# Patient Record
Sex: Female | Born: 1995 | Race: White | Hispanic: No | Marital: Married | Smoking: Former smoker
Health system: Southern US, Community
[De-identification: ages and names within clinical notes are randomized; demographics above are authoritative.]

## PROBLEM LIST (undated history)

## (undated) ENCOUNTER — Inpatient Hospital Stay (HOSPITAL_COMMUNITY): Payer: Self-pay

## (undated) DIAGNOSIS — R87629 Unspecified abnormal cytological findings in specimens from vagina: Secondary | ICD-10-CM

## (undated) DIAGNOSIS — E0590 Thyrotoxicosis, unspecified without thyrotoxic crisis or storm: Secondary | ICD-10-CM

## (undated) DIAGNOSIS — F419 Anxiety disorder, unspecified: Secondary | ICD-10-CM

## (undated) HISTORY — DX: Anxiety disorder, unspecified: F41.9

## (undated) HISTORY — PX: ABDOMINAL SURGERY: SHX537

## (undated) HISTORY — PX: OTHER SURGICAL HISTORY: SHX169

## (undated) HISTORY — PX: BALLOON SINUPLASTY: SHX5740

## (undated) HISTORY — DX: Unspecified abnormal cytological findings in specimens from vagina: R87.629

---
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
## 2013-01-09 ENCOUNTER — Encounter (HOSPITAL_COMMUNITY): Payer: Self-pay | Admitting: Emergency Medicine

## 2013-01-09 ENCOUNTER — Emergency Department (HOSPITAL_COMMUNITY): Payer: BC Managed Care – PPO

## 2013-01-09 ENCOUNTER — Emergency Department (HOSPITAL_COMMUNITY)
Admission: EM | Admit: 2013-01-09 | Discharge: 2013-01-09 | Disposition: A | Discharge status: Home / Self Care | Payer: BC Managed Care – PPO | Attending: Emergency Medicine | Admitting: Emergency Medicine

## 2013-01-09 DIAGNOSIS — R079 Chest pain, unspecified: Secondary | ICD-10-CM | POA: Insufficient documentation

## 2013-01-09 DIAGNOSIS — Z3202 Encounter for pregnancy test, result negative: Secondary | ICD-10-CM | POA: Insufficient documentation

## 2013-01-09 LAB — RAPID URINE DRUG SCREEN, HOSP PERFORMED
Amphetamines: NOT DETECTED
Barbiturates: NOT DETECTED
Benzodiazepines: NOT DETECTED
Cocaine: NOT DETECTED
Opiates: NOT DETECTED
Tetrahydrocannabinol: NOT DETECTED

## 2013-01-09 LAB — CBC WITH DIFFERENTIAL/PLATELET
Basophils Absolute: 0 10*3/uL (ref 0.0–0.1)
Basophils Relative: 0 % (ref 0–1)
Eosinophils Absolute: 0 10*3/uL (ref 0.0–0.7)
Eosinophils Relative: 0 % (ref 0–5)
HCT: 36.6 % (ref 36.0–46.0)
Hemoglobin: 11.7 g/dL — ABNORMAL LOW (ref 12.0–15.0)
Lymphocytes Relative: 6 % — ABNORMAL LOW (ref 12–46)
Lymphs Abs: 0.8 10*3/uL (ref 0.7–4.0)
MCH: 26.4 pg (ref 26.0–34.0)
MCHC: 32 g/dL (ref 30.0–36.0)
MCV: 82.4 fL (ref 78.0–100.0)
Monocytes Absolute: 0.5 10*3/uL (ref 0.1–1.0)
Monocytes Relative: 4 % (ref 3–12)
Neutro Abs: 12.2 10*3/uL — ABNORMAL HIGH (ref 1.7–7.7)
Neutrophils Relative %: 90 % — ABNORMAL HIGH (ref 43–77)
Platelets: 303 10*3/uL (ref 150–400)
RBC: 4.44 MIL/uL (ref 3.87–5.11)
RDW: 14.1 % (ref 11.5–15.5)
WBC: 13.5 10*3/uL — ABNORMAL HIGH (ref 4.0–10.5)

## 2013-01-09 LAB — BASIC METABOLIC PANEL
BUN: 5 mg/dL — ABNORMAL LOW (ref 6–23)
CO2: 24 mEq/L (ref 19–32)
Calcium: 9.6 mg/dL (ref 8.4–10.5)
Chloride: 107 mEq/L (ref 96–112)
Creatinine, Ser: 0.71 mg/dL (ref 0.50–1.10)
GFR calc Af Amer: >90 mL/min (ref >90)
GFR calc non Af Amer: >90 mL/min (ref >90)
Glucose, Bld: 113 mg/dL — ABNORMAL HIGH (ref 70–99)
Potassium: 4.2 mEq/L (ref 3.5–5.1)
Sodium: 141 mEq/L (ref 135–145)

## 2013-01-09 LAB — POCT PREGNANCY, URINE: Preg Test, Ur: NEGATIVE

## 2013-01-09 LAB — ETHANOL: Alcohol, Ethyl (B): <11 mg/dL (ref 0–11)

## 2013-01-09 LAB — CK: Total CK: 90 U/L (ref 7–177)

## 2013-01-09 MED ORDER — LORAZEPAM 2 MG/ML IJ SOLN
1.0000 mg | Freq: Once | INTRAMUSCULAR | Status: AC
  Administered 2013-01-09: 1 mg via INTRAVENOUS
  Filled 2013-01-09: qty 1

## 2013-01-09 MED ORDER — SODIUM CHLORIDE 0.9 % IV BOLUS (SEPSIS)
1000.0000 mL | Freq: Once | INTRAVENOUS | Status: AC
  Administered 2013-01-09: 1000 mL via INTRAVENOUS

## 2013-01-09 NOTE — ED Provider Notes (Signed)
CSN: [id number]     Arrival date & time 01/09/13  1958 History   First MD Initiated Contact with Patient 01/09/13 2109     Chief Complaint  Patient presents with  . Chest Pain  history is obtained from patient and patient's mother (Consider location/radiation/quality/duration/timing/severity/associated sxs/prior Treatment) HPI Plan the bilateral chest pain onset approximately 5 PM today after drinking a daiquiri andsmoking marijuana. Nothing makes symptoms better or worse.no treatment prior to coming here History reviewed. No pertinent past medical history. Past Surgical History  Procedure Laterality Date  . 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 No pertinent family history. History  Substance Use Topics  . Smoking status: Never Smoker   . Smokeless tobacco: Not on file  . Alcohol Use: No  admits to alcohol admits to marijuana admits to cigarettes OB History   Grav Para Term Preterm Abortions TAB SAB Ect Mult Living                 Review of Systems  Constitutional: Negative.   HENT: Negative.   Respiratory: Negative.   Cardiovascular: Positive for chest pain.  Gastrointestinal: Negative.   Musculoskeletal: Negative.   Skin: Negative.   Neurological: Negative.   Psychiatric/Behavioral: Negative.   All other systems reviewed and are negative.    Allergies  Review of patient's allergies indicates no known allergies.  Home Medications  No current outpatient prescriptions on file. BP 146/80  Pulse 140  Temp(Src) 98.9 F (37.2 C) (Oral)  Resp 20  Ht 5\' 3"  (1.6 m)  Wt 160 lb (72.576 kg)  BMI 28.35 kg/m2  SpO2 97%  LMP 01/08/2013 Physical Exam  Nursing note and vitals reviewed. Constitutional: She is oriented to person, place, and time. She appears well-developed and well-nourished.  HENT:  Head: Normocephalic and atraumatic.  Eyes: Conjunctivae are normal. Pupils are equal, round, and reactive to light.  Neck: Neck supple. No tracheal deviation present. No  thyromegaly present.  Cardiovascular: Regular rhythm.   No murmur heard. Mildly tachycardic  Pulmonary/Chest: Effort normal and breath sounds normal.  Abdominal: Soft. Bowel sounds are normal. She exhibits no distension. There is no tenderness.  Musculoskeletal: Normal range of motion. She exhibits no edema and no tenderness.  Neurological: She is alert and oriented to person, place, and time. Coordination normal.  Skin: Skin is warm and dry. No rash noted.  Psychiatric: She has a normal mood and affect.    ED Course  Procedures (including critical care time) Labs Review Labs Reviewed - No data to display Imaging Review No results found.  EKG Interpretation     Ventricular Rate:  88 PR Interval:  116 QRS Duration: 70 QT Interval:  342 QTC Calculation: 413 R Axis:   32 Text Interpretation:  Sinus rhythm with marked sinus arrhythmia Cannot rule out Anterior infarct , age undetermined Abnormal ECG When compared with ECG of 09-Jan-2013 20:46, No significant change was found Since last tracing rate slower           Results for orders placed during the hospital encounter of 01/09/13  BASIC METABOLIC PANEL      Result Value Range   Sodium 141  135 - 145 mEq/L   Potassium 4.2  3.5 - 5.1 mEq/L   Chloride 107  96 - 112 mEq/L   CO2 24  19 - 32 mEq/L   Glucose, Bld 113 (*) 70 - 99 mg/dL   BUN 5 (*) 6 - 23 mg/dL   Creatinine, Ser 4.09  0.50 - 1.10 mg/dL   Calcium 9.6  8.4 - 16.1 mg/dL   GFR calc non Af Amer >90  >90 mL/min   GFR calc Af Amer >90  >90 mL/min  CBC WITH DIFFERENTIAL      Result Value Range   WBC 13.5 (*) 4.0 - 10.5 K/uL   RBC 4.44  3.87 - 5.11 MIL/uL   Hemoglobin 11.7 (*) 12.0 - 15.0 g/dL   HCT 09.6  04.5 - 40.9 %   MCV 82.4  78.0 - 100.0 fL   MCH 26.4  26.0 - 34.0 pg   MCHC 32.0  30.0 - 36.0 g/dL   RDW 81.1  91.4 - 78.2 %   Platelets 303  150 - 400 K/uL   Neutrophils Relative % 90 (*) 43 - 77 %   Neutro Abs 12.2 (*) 1.7 - 7.7 K/uL   Lymphocytes  Relative 6 (*) 12 - 46 %   Lymphs Abs 0.8  0.7 - 4.0 K/uL   Monocytes Relative 4  3 - 12 %   Monocytes Absolute 0.5  0.1 - 1.0 K/uL   Eosinophils Relative 0  0 - 5 %   Eosinophils Absolute 0.0  0.0 - 0.7 K/uL   Basophils Relative 0  0 - 1 %   Basophils Absolute 0.0  0.0 - 0.1 K/uL  CK      Result Value Range   Total CK 90  7 - 177 U/L  ETHANOL      Result Value Range   Alcohol, Ethyl (B) <11  0 - 11 mg/dL  POCT PREGNANCY, URINE      Result Value Range   Preg Test, Ur NEGATIVE  NEGATIVE   No results found.   MDM  No diagnosis found. Pt signed out to Dr. Judd Lien at 1015 pm Diagnosis #1 chest pain #2 substance abuse    Doug Sou, MD 01/09/13 2243
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
## 2013-01-09 NOTE — ED Notes (Signed)
Chest pain, onset 5 pm.  Onset after having a virgin dacquiri. Began having chest pain.
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
## 2013-01-12 ENCOUNTER — Encounter (HOSPITAL_COMMUNITY): Payer: Self-pay | Admitting: Emergency Medicine

## 2013-01-12 DIAGNOSIS — R079 Chest pain, unspecified: Secondary | ICD-10-CM | POA: Insufficient documentation

## 2013-01-12 DIAGNOSIS — Z3202 Encounter for pregnancy test, result negative: Secondary | ICD-10-CM | POA: Insufficient documentation

## 2013-01-12 NOTE — ED Notes (Signed)
Pt walking outside to smoke while waiting

## 2013-01-12 NOTE — ED Notes (Signed)
Pt reporting pain in middle of chest.  States that she was seen on Thursday for same.  Pain not as bad this time as before. No nausea or SOB

## 2013-01-13 ENCOUNTER — Emergency Department (HOSPITAL_COMMUNITY)
Admission: EM | Admit: 2013-01-13 | Discharge: 2013-01-13 | Disposition: A | Discharge status: Home / Self Care | Payer: BC Managed Care – PPO | Attending: Emergency Medicine | Admitting: Emergency Medicine

## 2013-01-13 ENCOUNTER — Encounter (HOSPITAL_COMMUNITY): Payer: Self-pay | Admitting: Emergency Medicine

## 2013-01-13 ENCOUNTER — Emergency Department (HOSPITAL_COMMUNITY): Payer: BC Managed Care – PPO

## 2013-01-13 DIAGNOSIS — R079 Chest pain, unspecified: Secondary | ICD-10-CM

## 2013-01-13 LAB — BASIC METABOLIC PANEL
BUN: 11 mg/dL (ref 6–23)
CO2: 28 mEq/L (ref 19–32)
Calcium: 9.5 mg/dL (ref 8.4–10.5)
Chloride: 102 mEq/L (ref 96–112)
Creatinine, Ser: 0.73 mg/dL (ref 0.50–1.10)
GFR calc Af Amer: >90 mL/min (ref >90)
GFR calc non Af Amer: >90 mL/min (ref >90)
Glucose, Bld: 106 mg/dL — ABNORMAL HIGH (ref 70–99)
Potassium: 3.6 mEq/L (ref 3.5–5.1)
Sodium: 138 mEq/L (ref 135–145)

## 2013-01-13 LAB — D-DIMER, QUANTITATIVE (NOT AT ARMC): D-Dimer, Quant: <0.27 ug/mL-FEU (ref 0.00–0.48)

## 2013-01-13 LAB — POCT PREGNANCY, URINE: Preg Test, Ur: NEGATIVE

## 2013-01-13 MED ORDER — GI COCKTAIL ~~LOC~~: 30.0000 mL | Freq: Once | ORAL | Status: DC

## 2013-01-13 MED ORDER — IBUPROFEN 800 MG PO TABS
800.0000 mg | ORAL_TABLET | Freq: Once | ORAL | Status: AC
  Administered 2013-01-13: 800 mg via ORAL
  Filled 2013-01-13: qty 1

## 2013-01-13 MED ORDER — GI COCKTAIL ~~LOC~~
30.0000 mL | Freq: Once | ORAL | Status: AC
  Administered 2013-01-13: 30 mL via ORAL
  Filled 2013-01-13: qty 30

## 2013-01-13 NOTE — ED Provider Notes (Signed)
CSN: [id number]     Arrival date & time 01/12/13  2039 History   First MD Initiated Contact with Patient 01/13/13 0013     No chief complaint on file.  (Consider location/radiation/quality/duration/timing/severity/associated sxs/prior Treatment) HPI Comments: 17 yo female with no medical hx presents with mild anterior chest pain and epig pain, smoking hx, no sob or cough, non radiating, ache, pt has had since last ED visit last week.  Patient denies blood clot history, active cancer, recent major trauma or surgery, unilateral leg swelling/ pain, recent long travel, hemoptysis or oral contraceptives.  No recent illness. Worse with palpation and eating.  No exertional or syncope component.   The history is provided by the patient.    History reviewed. No pertinent past medical history. Past Surgical History  Procedure Laterality Date  . 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 No pertinent family history. History  Substance Use Topics  . Smoking status: Never Smoker   . Smokeless tobacco: Not on file  . Alcohol Use: No   OB History   Grav Para Term Preterm Abortions TAB SAB Ect Mult Living                 Review of Systems  Constitutional: Negative for fever and chills.  HENT: Negative for congestion.   Eyes: Negative for visual disturbance.  Respiratory: Negative for cough and shortness of breath.   Cardiovascular: Positive for chest pain.  Gastrointestinal: Negative for vomiting and abdominal pain.  Genitourinary: Negative for dysuria and flank pain.  Musculoskeletal: Negative for back pain, neck pain and neck stiffness.  Skin: Negative for rash.  Neurological: Negative for light-headedness and headaches.    Allergies  Review of patient's allergies indicates no known allergies.  Home Medications  No current outpatient prescriptions on file. BP 100/59  Pulse 60  Temp(Src) 98.6 F (37 C) (Oral)  Resp 16  Ht 5\' 3"  (1.6 m)  Wt 160 lb (72.576 kg)  BMI 28.35 kg/m2   SpO2 99%  LMP 01/08/2013 Physical Exam  Nursing note and vitals reviewed. Constitutional: She is oriented to person, place, and time. She appears well-developed and well-nourished.  HENT:  Head: Normocephalic and atraumatic.  Eyes: Conjunctivae are normal. Right eye exhibits no discharge. Left eye exhibits no discharge.  Neck: Normal range of motion. Neck supple. No tracheal deviation present.  Cardiovascular: Normal rate, regular rhythm and intact distal pulses.   No murmur heard. Pulmonary/Chest: Effort normal and breath sounds normal.  Abdominal: Soft. She exhibits no distension. There is no tenderness. There is no guarding.  Musculoskeletal: She exhibits no edema and no tenderness.  Neurological: She is alert and oriented to person, place, and time.  Skin: Skin is warm. No rash noted.  Psychiatric: She has a normal mood and affect.    ED Course  Procedures (including critical care time) Labs Review Labs Reviewed  BASIC METABOLIC PANEL - Abnormal; Notable for the following:    Glucose, Bld 106 (*)    All other components within normal limits  D-DIMER, QUANTITATIVE  POCT PREGNANCY, URINE   Imaging Review Dg Chest 2 View  01/13/2013   CLINICAL DATA:  Mid chest pain.  EXAM: CHEST  2 VIEW  COMPARISON:  01/09/2013.  FINDINGS: Normal sized heart. Clear lungs with normal vascularity. Normal appearing bones.  IMPRESSION: Normal examination.   Electronically Signed   By: Gordan Payment M.D.   On: 01/13/2013 01:34    EKG Interpretation   None  MDM   1. Chest pain    Very low risk CAD and PE. PERC neg however with recurrent visit/ persistent pain d dimer ordered.  D dimer neg.  GI cocktail and ibuprofen in ED.  Pt improved in ED.   Fup with pcp discussed.  Mild short PR.  Pt will fup with pcp for possible cardiology referral.  No FH of sudden death, no syncope.  Results and differential diagnosis were discussed with the patient. Close follow up outpatient was  discussed, patient comfortable with the plan.   Diagnosis:   Chest pain    Enid Skeens, MD 01/13/13 [phone]

## 2013-02-16 ENCOUNTER — Encounter (HOSPITAL_COMMUNITY): Payer: Self-pay | Admitting: Emergency Medicine

## 2013-02-16 ENCOUNTER — Emergency Department (HOSPITAL_COMMUNITY)
Admission: EM | Admit: 2013-02-16 | Discharge: 2013-02-16 | Disposition: A | Discharge status: Home / Self Care | Payer: BC Managed Care – PPO | Attending: Emergency Medicine | Admitting: Emergency Medicine

## 2013-02-16 DIAGNOSIS — K2900 Acute gastritis without bleeding: Secondary | ICD-10-CM | POA: Insufficient documentation

## 2013-02-16 DIAGNOSIS — Z87891 Personal history of nicotine dependence: Secondary | ICD-10-CM | POA: Insufficient documentation

## 2013-02-16 MED ORDER — PANTOPRAZOLE SODIUM 40 MG PO TBEC
40.0000 mg | DELAYED_RELEASE_TABLET | Freq: Once | ORAL | Status: AC
  Administered 2013-02-16: 40 mg via ORAL
  Filled 2013-02-16: qty 1

## 2013-02-16 MED ORDER — FAMOTIDINE 20 MG PO TABS: 20.0000 mg | ORAL_TABLET | Freq: Two times a day (BID) | ORAL | Status: DC

## 2013-02-16 MED ORDER — FAMOTIDINE 20 MG PO TABS
20.0000 mg | ORAL_TABLET | Freq: Once | ORAL | Status: AC
  Administered 2013-02-16: 20 mg via ORAL
  Filled 2013-02-16: qty 1

## 2013-02-16 MED ORDER — GI COCKTAIL ~~LOC~~
30.0000 mL | Freq: Once | ORAL | Status: AC
  Administered 2013-02-16: 30 mL via ORAL
  Filled 2013-02-16: qty 30

## 2013-02-16 NOTE — ED Notes (Signed)
Patient with no complaints at this time. Respirations even and unlabored. Skin warm/dry. Discharge instructions reviewed with patient at this time. Patient given opportunity to voice concerns/ask questions. Patient discharged at this time and left Emergency Department with steady gait.   

## 2013-02-16 NOTE — ED Provider Notes (Signed)
CSN: [id number]     Arrival date & time 02/16/13  0113 History   First MD Initiated Contact with Patient 02/16/13 0122     Chief Complaint  Patient presents with  . Chest Pain   (Consider location/radiation/quality/duration/timing/severity/associated sxs/prior Treatment) HPI History provided by patient. Samantha Sandoval is a 17 year old female who presents with epigastric burning pain radiating to substernal region after taking 3 vodka Jell-O shots earlier.  She has been evaluated twice in the last month for gastritis/ reflux.  She does not take any regular medications for this. She denies any difficulty breathing. Some nausea but no vomiting. No diarrhea. No radiation of pain. Symptoms moderate in severity.  Patient states the last time she was here she received something that coated her stomach and immediately resolved her pain. She is requesting the same medication.  History reviewed. No pertinent past medical history. Past Surgical History  Procedure Laterality Date  . 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 No pertinent family history. History  Substance Use Topics  . Smoking status: Former Games developer  . Smokeless tobacco: Not on file  . Alcohol Use: 0.0 oz/week    2-3 Shots of liquor per week     Comment: rare   OB History   Grav Para Term Preterm Abortions TAB SAB Ect Mult Living                 Review of Systems  Constitutional: Negative for fever and chills.  Respiratory: Negative for shortness of breath.   Cardiovascular: Positive for chest pain.  Gastrointestinal: Positive for abdominal pain. Negative for vomiting.  Genitourinary: Negative for flank pain.  Musculoskeletal: Negative for back pain, neck pain and neck stiffness.  Skin: Negative for rash.  Neurological: Negative for headaches.  All other systems reviewed and are negative.    Allergies  Review of patient's allergies indicates no known allergies.  Home Medications  No current outpatient prescriptions on  file. BP 105/62  Pulse 92  Resp 14  Ht 5\' 2"  (1.575 m)  Wt 165 lb (74.844 kg)  BMI 30.17 kg/m2  SpO2 96%  LMP 02/09/2013 Physical Exam  Constitutional: She is oriented to person, place, and time. She appears well-developed and well-nourished.  HENT:  Head: Normocephalic and atraumatic.  Mouth/Throat: Oropharynx is clear and moist.  Eyes: EOM are normal. Pupils are equal, round, and reactive to light.  Neck: Neck supple. No tracheal deviation present.  Cardiovascular: Normal rate, regular rhythm and intact distal pulses.   Pulmonary/Chest: Effort normal and breath sounds normal. No stridor. No respiratory distress. She exhibits no tenderness.  Abdominal: Soft. Bowel sounds are normal. She exhibits no distension. There is no tenderness. There is no rebound and no guarding.  No significant epigastric or right upper quadrant tenderness. No CVA tenderness.  Musculoskeletal: Normal range of motion. She exhibits no edema.  Neurological: She is alert and oriented to person, place, and time.  Skin: Skin is warm and dry.    ED Course  Procedures (including critical care time) Labs Review Labs Reviewed - No data to display Imaging Review No results found.  EKG Interpretation    Date/Time:  Sunday February 16 2013 01:45:17 EST Ventricular Rate:  79 PR Interval:  118 QRS Duration: 76 QT Interval:  356 QTC Calculation: 408 R Axis:   55 Text Interpretation:  Normal sinus rhythm with sinus arrhythmia Normal ECG No significant change since last tracing Confirmed by Delshon Blanchfield  MD, Dearia Wilmouth [phone]) on 02/16/2013 1:58:38 AM  Previous records reviewed. On last visit patient had a d-dimer which was negative. She is PERC negative.     GI cocktail, Pepcid, Protonix provided  Recheck symptoms improved. No indication for PE workup, imaging or blood work at this time.  Plan discharge home with prescription for Pepcid, GI referral as needed. Patient encouraged to take medications as  prescribed, avoid alcohol. Dietary instructions also provided. Strict return precautions verbalized as understood.  MDM  Diagnoses: Gastritis   Screening EKG Medications provided, condition improved Vital signs, old records and nursing notes reviewed and considered    Samantha Nielsen, MD 02/16/13 2333

## 2013-02-16 NOTE — ED Notes (Signed)
Had 3 jello shots tonight afterwhich she began to have burning, stabbing central chest pain. Denies vomiting, has some nausea.

## 2013-02-21 ENCOUNTER — Emergency Department (HOSPITAL_COMMUNITY)
Admission: EM | Admit: 2013-02-21 | Discharge: 2013-02-22 | Disposition: A | Discharge status: Home / Self Care | Payer: BC Managed Care – PPO | Attending: Emergency Medicine | Admitting: Emergency Medicine

## 2013-02-21 ENCOUNTER — Encounter (HOSPITAL_COMMUNITY): Payer: Self-pay | Admitting: Emergency Medicine

## 2013-02-21 DIAGNOSIS — Z87891 Personal history of nicotine dependence: Secondary | ICD-10-CM | POA: Insufficient documentation

## 2013-02-21 DIAGNOSIS — K219 Gastro-esophageal reflux disease without esophagitis: Secondary | ICD-10-CM | POA: Insufficient documentation

## 2013-02-21 DIAGNOSIS — R209 Unspecified disturbances of skin sensation: Secondary | ICD-10-CM | POA: Insufficient documentation

## 2013-02-21 DIAGNOSIS — Z79899 Other long term (current) drug therapy: Secondary | ICD-10-CM | POA: Insufficient documentation

## 2013-02-21 DIAGNOSIS — R0789 Other chest pain: Secondary | ICD-10-CM | POA: Insufficient documentation

## 2013-02-21 DIAGNOSIS — F410 Panic disorder [episodic paroxysmal anxiety] without agoraphobia: Secondary | ICD-10-CM | POA: Insufficient documentation
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
## 2013-02-22 MED ORDER — LORAZEPAM 1 MG PO TABS: 1.0000 mg | ORAL_TABLET | Freq: Three times a day (TID) | ORAL | Status: DC | PRN

## 2013-02-22 MED ORDER — LORAZEPAM 1 MG PO TABS
1.0000 mg | ORAL_TABLET | Freq: Once | ORAL | Status: AC
  Administered 2013-02-22: 1 mg via ORAL
  Filled 2013-02-22: qty 1
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
## 2013-07-09 ENCOUNTER — Ambulatory Visit: Payer: Self-pay | Admitting: Women's Health

## 2013-07-14 ENCOUNTER — Ambulatory Visit: Payer: Self-pay | Admitting: Women's Health

## 2013-09-02 ENCOUNTER — Other Ambulatory Visit: Payer: Self-pay | Admitting: Adult Health

## 2015-05-25 ENCOUNTER — Emergency Department (HOSPITAL_COMMUNITY)
Admission: EM | Admit: 2015-05-25 | Discharge: 2015-05-25 | Disposition: A | Discharge status: Home / Self Care | Payer: Self-pay

## 2015-05-25 ENCOUNTER — Emergency Department (HOSPITAL_COMMUNITY)
Admission: EM | Admit: 2015-05-25 | Discharge: 2015-05-25 | Disposition: A | Discharge status: Home / Self Care | Payer: Self-pay | Attending: Emergency Medicine | Admitting: Emergency Medicine

## 2015-05-25 ENCOUNTER — Encounter (HOSPITAL_COMMUNITY): Payer: Self-pay

## 2015-05-25 DIAGNOSIS — Z87891 Personal history of nicotine dependence: Secondary | ICD-10-CM | POA: Insufficient documentation

## 2015-05-25 DIAGNOSIS — R1030 Lower abdominal pain, unspecified: Secondary | ICD-10-CM | POA: Insufficient documentation

## 2015-05-25 LAB — BASIC METABOLIC PANEL
Anion gap: 6 (ref 5–15)
BUN: 9 mg/dL (ref 6–20)
CO2: 27 mmol/L (ref 22–32)
Calcium: 9.1 mg/dL (ref 8.9–10.3)
Chloride: 107 mmol/L (ref 101–111)
Creatinine, Ser: 0.73 mg/dL (ref 0.44–1.00)
GFR calc Af Amer: >60 mL/min (ref >60)
GFR calc non Af Amer: >60 mL/min (ref >60)
Glucose, Bld: 89 mg/dL (ref 65–99)
Potassium: 4.2 mmol/L (ref 3.5–5.1)
Sodium: 140 mmol/L (ref 135–145)

## 2015-05-25 LAB — CBC WITH DIFFERENTIAL/PLATELET
Basophils Absolute: 0 10*3/uL (ref 0.0–0.1)
Basophils Relative: 0 %
Eosinophils Absolute: 0 10*3/uL (ref 0.0–0.7)
Eosinophils Relative: 0 %
HCT: 41.8 % (ref 36.0–46.0)
Hemoglobin: 13.8 g/dL (ref 12.0–15.0)
Lymphocytes Relative: 22 %
Lymphs Abs: 1.5 10*3/uL (ref 0.7–4.0)
MCH: 28.9 pg (ref 26.0–34.0)
MCHC: 33 g/dL (ref 30.0–36.0)
MCV: 87.4 fL (ref 78.0–100.0)
Monocytes Absolute: 0.5 10*3/uL (ref 0.1–1.0)
Monocytes Relative: 7 %
Neutro Abs: 4.8 10*3/uL (ref 1.7–7.7)
Neutrophils Relative %: 71 %
Platelets: 254 10*3/uL (ref 150–400)
RBC: 4.78 MIL/uL (ref 3.87–5.11)
RDW: 12.8 % (ref 11.5–15.5)
WBC: 6.8 10*3/uL (ref 4.0–10.5)

## 2015-05-25 LAB — URINE MICROSCOPIC-ADD ON

## 2015-05-25 LAB — URINALYSIS, ROUTINE W REFLEX MICROSCOPIC
Bilirubin Urine: NEGATIVE
Glucose, UA: NEGATIVE mg/dL
Hgb urine dipstick: NEGATIVE
Ketones, ur: NEGATIVE mg/dL
Nitrite: NEGATIVE
Protein, ur: NEGATIVE mg/dL
Specific Gravity, Urine: 1.01 (ref 1.005–1.030)
pH: 7 (ref 5.0–8.0)

## 2015-05-25 LAB — PREGNANCY, URINE: Preg Test, Ur: NEGATIVE

## 2015-05-25 LAB — HCG, QUANTITATIVE, PREGNANCY: hCG, Beta Chain, Quant, S: <1 m[IU]/mL (ref <5)

## 2015-05-25 MED ORDER — NAPROXEN 500 MG PO TABS: 500.0000 mg | ORAL_TABLET | Freq: Two times a day (BID) | ORAL | Status: DC

## 2015-05-25 NOTE — ED Notes (Signed)
Pt made aware to return if symptoms worsen or if any life threatening symptoms occur.   

## 2015-05-25 NOTE — Discharge Instructions (Signed)
Tests were all normal including a negative pregnancy test. Anti-inflammatory pain medicine.

## 2015-05-25 NOTE — ED Notes (Signed)
Pt reports lower back and lower abd pain x 1 1/2 weeks. Reports pain and burning with urination.  Denies abnormal vaginal bleeding or discharge.   Reports nausea, no vomiting.  LBM was yesterday.  LMP last month.  Says is supposed to start today.

## 2015-05-25 NOTE — ED Provider Notes (Signed)
CSN: [id number]     Arrival date & time 05/25/15  1325 History   First MD Initiated Contact with Patient 05/25/15 1549     Chief Complaint  Patient presents with  . Abdominal Pain  . Back Pain     (Consider location/radiation/quality/duration/timing/severity/associated sxs/prior Treatment) HPI....Marland Kitchen.Marland Kitchen. bilateral lower abdominal pain radiating to the back for 2 weeks. Last menstrual period February 23. Her new menstrual period is due right now. No vaginal bleeding, discharge, fever, sweats, chills, dysuria. She is concerned that she might be pregnant. [facility]. No chronic health problems. Severity of pain is mild  History reviewed. No pertinent past medical history. Past Surgical History  Procedure Laterality Date  . 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 Abdominal surgery     No family history on file. Social History  Substance Use Topics  . Smoking status: Former Games developermoker  . Smokeless tobacco: None  . Alcohol Use: 0.0 oz/week    2-3 Shots of liquor per week     Comment: none   OB History    No data available     Review of Systems  All other systems reviewed and are negative.     Allergies  Review of patient's allergies indicates no known allergies.  Home Medications   Prior to Admission medications   Medication Sig Start Date End Date Taking? Authorizing Provider  naproxen (NAPROSYN) 500 MG tablet Take 1 tablet (500 mg total) by mouth 2 (two) times daily. 05/25/15   Donnetta HutchingBrian Lyra Alaimo, MD   BP 103/64 mmHg  Pulse 74  Temp(Src) 98.4 F (36.9 C) (Oral)  Resp 18  Ht 5\' 3"  (1.6 m)  Wt 180 lb (81.647 kg)  BMI 31.89 kg/m2  SpO2 98%  LMP 04/27/2015 Physical Exam  Constitutional: She is oriented to person, place, and time. She appears well-developed and well-nourished.  HENT:  Head: Normocephalic and atraumatic.  Eyes: Conjunctivae and EOM are normal. Pupils are equal, round, and reactive to light.  Neck: Normal range of motion. Neck supple.   Cardiovascular: Normal rate and regular rhythm.   Pulmonary/Chest: Effort normal and breath sounds normal.  Abdominal: Soft. Bowel sounds are normal.  No lower abdominal tenderness  Musculoskeletal: Normal range of motion.  Neurological: She is alert and oriented to person, place, and time.  Skin: Skin is warm and dry.  Psychiatric: She has a normal mood and affect. Her behavior is normal.  Nursing note and vitals reviewed.   ED Course  Procedures (including critical care time) Labs Review Labs Reviewed  URINALYSIS, ROUTINE W REFLEX MICROSCOPIC (NOT AT [facility]) - Abnormal; Notable for the following:    Leukocytes, UA MODERATE (*)    All other components within normal limits  URINE MICROSCOPIC-ADD ON - Abnormal; Notable for the following:    Squamous Epithelial / LPF 6-30 (*)    Bacteria, UA MANY (*)    All other components within normal limits  CBC WITH DIFFERENTIAL/PLATELET  BASIC METABOLIC PANEL  PREGNANCY, URINE  HCG, QUANTITATIVE, PREGNANCY    Imaging Review No results found. I have personally reviewed and evaluated these images and lab results as part of my medical decision-making.   EKG Interpretation None      MDM   Final diagnoses:  Lower abdominal pain    Screening labs all normal. Pregnancy test negative. Discharge medications Naprosyn 500 mg.    Donnetta HutchingBrian Shauntell Iglesia, MD 05/25/15 [phone]

## 2015-05-27 ENCOUNTER — Encounter (HOSPITAL_COMMUNITY): Payer: Self-pay | Admitting: *Deleted

## 2015-05-27 ENCOUNTER — Emergency Department (HOSPITAL_COMMUNITY): Payer: Self-pay

## 2015-05-27 ENCOUNTER — Emergency Department (HOSPITAL_COMMUNITY)
Admission: EM | Admit: 2015-05-27 | Discharge: 2015-05-27 | Disposition: A | Discharge status: Home / Self Care | Payer: Self-pay | Attending: Emergency Medicine | Admitting: Emergency Medicine

## 2015-05-27 DIAGNOSIS — Z791 Long term (current) use of non-steroidal anti-inflammatories (NSAID): Secondary | ICD-10-CM | POA: Insufficient documentation

## 2015-05-27 DIAGNOSIS — R079 Chest pain, unspecified: Secondary | ICD-10-CM | POA: Insufficient documentation

## 2015-05-27 DIAGNOSIS — Z9889 Other specified postprocedural states: Secondary | ICD-10-CM | POA: Insufficient documentation

## 2015-05-27 DIAGNOSIS — R109 Unspecified abdominal pain: Secondary | ICD-10-CM | POA: Insufficient documentation

## 2015-05-27 DIAGNOSIS — Z87891 Personal history of nicotine dependence: Secondary | ICD-10-CM | POA: Insufficient documentation

## 2015-05-27 DIAGNOSIS — R0602 Shortness of breath: Secondary | ICD-10-CM | POA: Insufficient documentation

## 2015-05-27 LAB — I-STAT TROPONIN, ED: Troponin i, poc: 0 ng/mL (ref 0.00–0.08)

## 2015-05-27 LAB — BASIC METABOLIC PANEL
Anion gap: 8 (ref 5–15)
BUN: 11 mg/dL (ref 6–20)
CO2: 25 mmol/L (ref 22–32)
Calcium: 9.3 mg/dL (ref 8.9–10.3)
Chloride: 107 mmol/L (ref 101–111)
Creatinine, Ser: 0.84 mg/dL (ref 0.44–1.00)
GFR calc Af Amer: >60 mL/min (ref >60)
GFR calc non Af Amer: >60 mL/min (ref >60)
Glucose, Bld: 104 mg/dL — ABNORMAL HIGH (ref 65–99)
Potassium: 4.2 mmol/L (ref 3.5–5.1)
Sodium: 140 mmol/L (ref 135–145)

## 2015-05-27 LAB — CBC
HCT: 40.5 % (ref 36.0–46.0)
Hemoglobin: 13.1 g/dL (ref 12.0–15.0)
MCH: 28.4 pg (ref 26.0–34.0)
MCHC: 32.3 g/dL (ref 30.0–36.0)
MCV: 87.7 fL (ref 78.0–100.0)
Platelets: 263 10*3/uL (ref 150–400)
RBC: 4.62 MIL/uL (ref 3.87–5.11)
RDW: 12.8 % (ref 11.5–15.5)
WBC: 7.5 10*3/uL (ref 4.0–10.5)

## 2015-05-27 MED ORDER — IBUPROFEN 400 MG PO TABS
400.0000 mg | ORAL_TABLET | Freq: Once | ORAL | Status: AC
  Administered 2015-05-27: 400 mg via ORAL
  Filled 2015-05-27: qty 1

## 2015-05-27 NOTE — ED Notes (Signed)
Pt stable, ambulatory, states understanding of discharge instructions 

## 2015-05-27 NOTE — ED Provider Notes (Signed)
CSN: [id number]     Arrival date & time 05/27/15  0055 History   First MD Initiated Contact with Patient 05/27/15 [phone]     Chief Complaint  Patient presents with  . Chest Pain     (Consider location/radiation/quality/duration/timing/severity/associated sxs/prior Treatment) Patient is a 20 y.o. female presenting with chest pain.  Chest Pain Pain location:  Substernal area Pain quality: sharp   Pain radiates to:  Does not radiate Pain radiates to the back: no   Pain severity:  Mild Progression:  Resolved Chronicity:  New Context: no drug use, not lifting and not at rest   Relieved by:  None tried Worsened by:  Coughing Associated symptoms: shortness of breath   Associated symptoms: no abdominal pain, no dysphagia, no fatigue and no nausea     History reviewed. No pertinent past medical history. Past Surgical History  Procedure Laterality Date  . 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 Abdominal surgery     No family history on file. Social History  Substance Use Topics  . Smoking status: Former Games developer  . Smokeless tobacco: None  . Alcohol Use: 0.0 oz/week    2-3 Shots of liquor per week     Comment: none   OB History    No data available     Review of Systems  Constitutional: Negative for chills and fatigue.  HENT: Negative for trouble swallowing.   Eyes: Negative for pain.  Respiratory: Positive for shortness of breath. Negative for chest tightness.   Cardiovascular: Positive for chest pain.  Gastrointestinal: Negative for nausea, abdominal pain, diarrhea and constipation.  Endocrine: Negative for polydipsia and polyuria.  Genitourinary: Negative for dysuria and flank pain.  Musculoskeletal: Negative for neck pain.  All other systems reviewed and are negative.     Allergies  Review of patient's allergies indicates no known allergies.  Home Medications   Prior to Admission medications   Medication Sig Start Date End Date Taking? Authorizing Provider  naproxen  (NAPROSYN) 500 MG tablet Take 1 tablet (500 mg total) by mouth 2 (two) times daily. 05/25/15   Donnetta Hutching, MD   BP 107/69 mmHg  Pulse 74  Temp(Src) 98.2 F (36.8 C) (Oral)  Resp 16  SpO2 100%  LMP 05/27/2015 Physical Exam  Constitutional: She is oriented to person, place, and time. She appears well-developed and well-nourished.  HENT:  Head: Normocephalic and atraumatic.  Neck: Normal range of motion.  Cardiovascular: Normal rate and regular rhythm.   Pulmonary/Chest: Effort normal. No stridor. No respiratory distress. She has no wheezes. She has no rales.  Abdominal: Soft. She exhibits no distension. There is no tenderness.  Musculoskeletal: She exhibits no edema or tenderness.  Neurological: She is alert and oriented to person, place, and time. No cranial nerve deficit. Coordination normal.  Skin: Skin is warm and dry.  Nursing note and vitals reviewed.   ED Course  Procedures (including critical care time) Labs Review Labs Reviewed  BASIC METABOLIC PANEL - Abnormal; Notable for the following:    Glucose, Bld 104 (*)    All other components within normal limits  CBC  URINALYSIS, ROUTINE W REFLEX MICROSCOPIC (NOT AT [facility])  Rosezena Sensor, ED    Imaging Review Dg Chest 2 View  05/27/2015  CLINICAL DATA:  20 year old female with chest pain EXAM: CHEST  2 VIEW COMPARISON:  Radiograph dated 01/13/2013 FINDINGS: The heart size and mediastinal contours are within normal limits. Both lungs are clear. The visualized skeletal structures are unremarkable. IMPRESSION: No  active cardiopulmonary disease. Electronically Signed   By: Elgie CollardArash  Radparvar M.D.   On: 05/27/2015 01:34   I have personally reviewed and evaluated these images and lab results as part of my medical decision-making.   EKG Interpretation   Date/Time:  Thursday May 27 2015 01:00:03 EDT Ventricular Rate:  89 PR Interval:  114 QRS Duration: 76 QT Interval:  358 QTC Calculation: 435 R Axis:   59 Text  Interpretation:  Normal sinus rhythm Cannot rule out Anterior infarct  , age undetermined Abnormal ECG When compared to previous ECG, rate is  slower Otherwise no significant change Confirmed by [facility] MD, ERIN  ([id number]) on 05/27/2015 1:03:39 AM Also confirmed by [facility] IncCHLOSSMAN MD, ERIN  ([id number]), editor [place]  CCT, BEVERLY ([id number])  on 05/27/2015 7:36:43 AM      MDM   Final diagnoses:  Abdominal pain, unspecified abdominal location  Chest pain, unspecified chest pain type   2 weeks of abdominal pain. Has been worked up multiple times in past. No new changes. Took tylenol w/ diphenhydramine last night and had an anxitous feeling, sob, cp. Mom brought here for eval. Symptoms resolved prior to my evaluation. Still abdominal pain.  Doubt ACS, PERC negative for PE. cxr and ecg ok from heart/lung ddx standpoint. Suspect possible akathisia from benadryl. obs in ED without return of symptoms.  Will fu w/ pcp for continued workup of abdominal pain that is becoming chronic in nature.   New Prescriptions: Discharge Medication List as of 05/27/2015  5:29 AM       I have personally and contemperaneously reviewed labs and imaging and used in my decision making as above.   A medical screening exam was performed and I feel the patient has had an appropriate workup for their chief complaint at this time and likelihood of emergent condition existing is low. Their vital signs are stable. They have been counseled on decision, discharge, follow up and which symptoms necessitate immediate return to the emergency department.  They verbally stated understanding and agreement with plan and discharged in stable condition.      Marily MemosJason Eberardo Demello, MD 05/27/15 [phone]

## 2015-05-27 NOTE — ED Notes (Signed)
Pt c/o central cp that is tight x 3 hours.

## 2015-09-09 ENCOUNTER — Encounter (HOSPITAL_COMMUNITY): Payer: Self-pay | Admitting: *Deleted

## 2015-09-09 ENCOUNTER — Emergency Department (HOSPITAL_COMMUNITY)
Admission: EM | Admit: 2015-09-09 | Discharge: 2015-09-10 | Disposition: A | Discharge status: Home / Self Care | Payer: Medicaid Other | Attending: Emergency Medicine | Admitting: Emergency Medicine

## 2015-09-09 DIAGNOSIS — Z87891 Personal history of nicotine dependence: Secondary | ICD-10-CM | POA: Diagnosis not present

## 2015-09-09 DIAGNOSIS — O23591 Infection of other part of genital tract in pregnancy, first trimester: Secondary | ICD-10-CM | POA: Diagnosis not present

## 2015-09-09 DIAGNOSIS — R102 Pelvic and perineal pain: Secondary | ICD-10-CM | POA: Diagnosis present

## 2015-09-09 DIAGNOSIS — B9689 Other specified bacterial agents as the cause of diseases classified elsewhere: Secondary | ICD-10-CM

## 2015-09-09 DIAGNOSIS — Z3201 Encounter for pregnancy test, result positive: Secondary | ICD-10-CM | POA: Diagnosis not present

## 2015-09-09 DIAGNOSIS — Z3490 Encounter for supervision of normal pregnancy, unspecified, unspecified trimester: Secondary | ICD-10-CM

## 2015-09-09 DIAGNOSIS — N760 Acute vaginitis: Secondary | ICD-10-CM

## 2015-09-09 NOTE — ED Notes (Signed)
Pt reports lower abdominal pain that started today.

## 2015-09-10 LAB — URINALYSIS, ROUTINE W REFLEX MICROSCOPIC
Bilirubin Urine: NEGATIVE
Glucose, UA: NEGATIVE mg/dL
Hgb urine dipstick: NEGATIVE
Ketones, ur: NEGATIVE mg/dL
Leukocytes, UA: NEGATIVE
Nitrite: NEGATIVE
Protein, ur: NEGATIVE mg/dL
Specific Gravity, Urine: >1.03 — ABNORMAL HIGH (ref 1.005–1.030)
pH: 5.5 (ref 5.0–8.0)

## 2015-09-10 LAB — COMPREHENSIVE METABOLIC PANEL
ALT: 15 U/L (ref 14–54)
AST: 17 U/L (ref 15–41)
Albumin: 4.3 g/dL (ref 3.5–5.0)
Alkaline Phosphatase: 71 U/L (ref 38–126)
Anion gap: 9 (ref 5–15)
BUN: 13 mg/dL (ref 6–20)
CO2: 23 mmol/L (ref 22–32)
Calcium: 8.9 mg/dL (ref 8.9–10.3)
Chloride: 105 mmol/L (ref 101–111)
Creatinine, Ser: 0.7 mg/dL (ref 0.44–1.00)
GFR calc Af Amer: >60 mL/min (ref >60)
GFR calc non Af Amer: >60 mL/min (ref >60)
Glucose, Bld: 89 mg/dL (ref 65–99)
Potassium: 3.9 mmol/L (ref 3.5–5.1)
Sodium: 137 mmol/L (ref 135–145)
Total Bilirubin: 0.4 mg/dL (ref 0.3–1.2)
Total Protein: 7.2 g/dL (ref 6.5–8.1)

## 2015-09-10 LAB — CBC WITH DIFFERENTIAL/PLATELET
Basophils Absolute: 0 10*3/uL (ref 0.0–0.1)
Basophils Relative: 0 %
Eosinophils Absolute: 0.1 10*3/uL (ref 0.0–0.7)
Eosinophils Relative: 1 %
HCT: 38.5 % (ref 36.0–46.0)
Hemoglobin: 12.9 g/dL (ref 12.0–15.0)
Lymphocytes Relative: 30 %
Lymphs Abs: 2.3 10*3/uL (ref 0.7–4.0)
MCH: 28.8 pg (ref 26.0–34.0)
MCHC: 33.5 g/dL (ref 30.0–36.0)
MCV: 85.9 fL (ref 78.0–100.0)
Monocytes Absolute: 0.5 10*3/uL (ref 0.1–1.0)
Monocytes Relative: 6 %
Neutro Abs: 4.8 10*3/uL (ref 1.7–7.7)
Neutrophils Relative %: 63 %
Platelets: 247 10*3/uL (ref 150–400)
RBC: 4.48 MIL/uL (ref 3.87–5.11)
RDW: 12.6 % (ref 11.5–15.5)
WBC: 7.6 10*3/uL (ref 4.0–10.5)

## 2015-09-10 LAB — WET PREP, GENITAL
Sperm: NONE SEEN
Trich, Wet Prep: NONE SEEN
Yeast Wet Prep HPF POC: NONE SEEN

## 2015-09-10 LAB — HCG, QUANTITATIVE, PREGNANCY: hCG, Beta Chain, Quant, S: 49 m[IU]/mL — ABNORMAL HIGH (ref <5)

## 2015-09-10 LAB — PREGNANCY, URINE: Preg Test, Ur: POSITIVE — AB

## 2015-09-10 MED ORDER — METRONIDAZOLE 500 MG PO TABS: 500.0000 mg | ORAL_TABLET | Freq: Two times a day (BID) | ORAL | Status: DC

## 2015-09-10 NOTE — Discharge Instructions (Signed)
Bacterial Vaginosis °Bacterial vaginosis is an infection of the vagina. It happens when too many germs (bacteria) grow in the vagina. Having this infection puts you at risk for getting other infections from sex. Treating this infection can help lower your risk for other infections, such as:  °· Chlamydia. °· Gonorrhea. °· HIV. °· Herpes. °HOME CARE °· Take your medicine as told by your doctor. °· Finish your medicine even if you start to feel better. °· Tell your sex partner that you have an infection. They should see their doctor for treatment. °· During treatment: °¨ Avoid sex or use condoms correctly. °¨ Do not douche. °¨ Do not drink alcohol unless your doctor tells you it is ok. °¨ Do not breastfeed unless your doctor tells you it is ok. °GET HELP IF: °· You are not getting better after 3 days of treatment. °· You have more grey fluid (discharge) coming from your vagina than before. °· You have more pain than before. °· You have a fever. °MAKE SURE YOU:  °· Understand these instructions. °· Will watch your condition. °· Will get help right away if you are not doing well or get worse. °  °This information is not intended to replace advice given to you by your health care provider. Make sure you discuss any questions you have with your health care provider. °  °Document Released: 11/30/2007 Document Revised: 03/13/2014 Document Reviewed: 10/02/2012 °Elsevier Interactive Patient Education ©2016 Elsevier Inc. ° °First Trimester of Pregnancy °The first trimester of pregnancy is from week 1 until the end of week 12 (months 1 through 3). A week after a sperm fertilizes an egg, the egg will implant on the wall of the uterus. This embryo will begin to develop into a baby. Genes from you and your partner are forming the baby. The female genes determine whether the baby is a boy or a girl. At 6-8 weeks, the eyes and face are formed, and the heartbeat can be seen on ultrasound. At the end of 12 weeks, all the baby's organs  are formed.  °Now that you are pregnant, you will want to do everything you can to have a healthy baby. Two of the most important things are to get good prenatal care and to follow your health care provider's instructions. Prenatal care is all the medical care you receive before the baby's birth. This care will help prevent, find, and treat any problems during the pregnancy and childbirth. °BODY CHANGES °Your body goes through many changes during pregnancy. The changes vary from woman to woman.  °· You may gain or lose a couple of pounds at first. °· You may feel sick to your stomach (nauseous) and throw up (vomit). If the vomiting is uncontrollable, call your health care provider. °· You may tire easily. °· You may develop headaches that can be relieved by medicines approved by your health care provider. °· You may urinate more often. Painful urination may mean you have a bladder infection. °· You may develop heartburn as a result of your pregnancy. °· You may develop constipation because certain hormones are causing the muscles that push waste through your intestines to slow down. °· You may develop hemorrhoids or swollen, bulging veins (varicose veins). °· Your breasts may begin to grow larger and become tender. Your nipples may stick out more, and the tissue that surrounds them (areola) may become darker. °· Your gums may bleed and may be sensitive to brushing and flossing. °· Dark spots or blotches (chloasma,   mask of pregnancy) may develop on your face. This will likely fade after the baby is born.  Your menstrual periods will stop.  You may have a loss of appetite.  You may develop cravings for certain kinds of food.  You may have changes in your emotions from day to day, such as being excited to be pregnant or being concerned that something may go wrong with the pregnancy and baby.  You may have more vivid and strange dreams.  You may have changes in your hair. These can include thickening of your  hair, rapid growth, and changes in texture. Some women also have hair loss during or after pregnancy, or hair that feels dry or thin. Your hair will most likely return to normal after your baby is born. WHAT TO EXPECT AT YOUR PRENATAL VISITS During a routine prenatal visit:  You will be weighed to make sure you and the baby are growing normally.  Your blood pressure will be taken.  Your abdomen will be measured to track your baby's growth.  The fetal heartbeat will be listened to starting around week 10 or 12 of your pregnancy.  Test results from any previous visits will be discussed. Your health care provider may ask you:  How you are feeling.  If you are feeling the baby move.  If you have had any abnormal symptoms, such as leaking fluid, bleeding, severe headaches, or abdominal cramping.  If you are using any tobacco products, including cigarettes, chewing tobacco, and electronic cigarettes.  If you have any questions. Other tests that may be performed during your first trimester include:  Blood tests to find your blood type and to check for the presence of any previous infections. They will also be used to check for low iron levels (anemia) and Rh antibodies. Later in the pregnancy, blood tests for diabetes will be done along with other tests if problems develop.  Urine tests to check for infections, diabetes, or protein in the urine.  An ultrasound to confirm the proper growth and development of the baby.  An amniocentesis to check for possible genetic problems.  Fetal screens for spina bifida and Down syndrome.  You may need other tests to make sure you and the baby are doing well.  HIV (human immunodeficiency virus) testing. Routine prenatal testing includes screening for HIV, unless you choose not to have this test. HOME CARE INSTRUCTIONS  Medicines  Follow your health care provider's instructions regarding medicine use. Specific medicines may be either safe or  unsafe to take during pregnancy.  Take your prenatal vitamins as directed.  If you develop constipation, try taking a stool softener if your health care provider approves. Diet  Eat regular, well-balanced meals. Choose a variety of foods, such as meat or vegetable-based protein, fish, milk and low-fat dairy products, vegetables, fruits, and whole grain breads and cereals. Your health care provider will help you determine the amount of weight gain that is right for you.  Avoid raw meat and uncooked cheese. These carry germs that can cause birth defects in the baby.  Eating four or five small meals rather than three large meals a day may help relieve nausea and vomiting. If you start to feel nauseous, eating a few soda crackers can be helpful. Drinking liquids between meals instead of during meals also seems to help nausea and vomiting.  If you develop constipation, eat more high-fiber foods, such as fresh vegetables or fruit and whole grains. Drink enough fluids to keep your urine  clear or pale yellow. Activity and Exercise  Exercise only as directed by your health care provider. Exercising will help you:  Control your weight.  Stay in shape.  Be prepared for labor and delivery.  Experiencing pain or cramping in the lower abdomen or low back is a good sign that you should stop exercising. Check with your health care provider before continuing normal exercises.  Try to avoid standing for long periods of time. Move your legs often if you must stand in one place for a long time.  Avoid heavy lifting.  Wear low-heeled shoes, and practice good posture.  You may continue to have sex unless your health care provider directs you otherwise. Relief of Pain or Discomfort  Wear a good support bra for breast tenderness.   Take warm sitz baths to soothe any pain or discomfort caused by hemorrhoids. Use hemorrhoid cream if your health care provider approves.   Rest with your legs elevated if  you have leg cramps or low back pain.  If you develop varicose veins in your legs, wear support hose. Elevate your feet for 15 minutes, 3-4 times a day. Limit salt in your diet. Prenatal Care  Schedule your prenatal visits by the twelfth week of pregnancy. They are usually scheduled monthly at first, then more often in the last 2 months before delivery.  Write down your questions. Take them to your prenatal visits.  Keep all your prenatal visits as directed by your health care provider. Safety  Wear your seat belt at all times when driving.  Make a list of emergency phone numbers, including numbers for family, friends, the hospital, and police and fire departments. General Tips  Ask your health care provider for a referral to a local prenatal education class. Begin classes no later than at the beginning of month 6 of your pregnancy.  Ask for help if you have counseling or nutritional needs during pregnancy. Your health care provider can offer advice or refer you to specialists for help with various needs.  Do not use hot tubs, steam rooms, or saunas.  Do not douche or use tampons or scented sanitary pads.  Do not cross your legs for long periods of time.  Avoid cat litter boxes and soil used by cats. These carry germs that can cause birth defects in the baby and possibly loss of the fetus by miscarriage or stillbirth.  Avoid all smoking, herbs, alcohol, and medicines not prescribed by your health care provider. Chemicals in these affect the formation and growth of the baby.  Do not use any tobacco products, including cigarettes, chewing tobacco, and electronic cigarettes. If you need help quitting, ask your health care provider. You may receive counseling support and other resources to help you quit.  Schedule a dentist appointment. At home, brush your teeth with a soft toothbrush and be gentle when you floss. SEEK MEDICAL CARE IF:   You have dizziness.  You have mild pelvic  cramps, pelvic pressure, or nagging pain in the abdominal area.  You have persistent nausea, vomiting, or diarrhea.  You have a bad smelling vaginal discharge.  You have pain with urination.  You notice increased swelling in your face, hands, legs, or ankles. SEEK IMMEDIATE MEDICAL CARE IF:   You have a fever.  You are leaking fluid from your vagina.  You have spotting or bleeding from your vagina.  You have severe abdominal cramping or pain.  You have rapid weight gain or loss.  You vomit blood or  material that looks like coffee grounds.  You are exposed to [place] measles and have never had them.  You are exposed to fifth disease or chickenpox.  You develop a severe headache.  You have shortness of breath.  You have any kind of trauma, such as from a fall or a car accident.   This information is not intended to replace advice given to you by your health care provider. Make sure you discuss any questions you have with your health care provider.   Document Released: 02/14/2001 Document Revised: 03/13/2014 Document Reviewed: 12/31/2012 Elsevier Interactive Patient Education [employer]2016 Elsevier Inc.

## 2015-09-10 NOTE — ED Provider Notes (Signed)
CSN: [id number]     Arrival date & time 09/09/15  2318 History   First MD Initiated Contact with Patient 09/09/15 2357     Chief Complaint  Patient presents with  . Abdominal Pain     (Consider location/radiation/quality/duration/timing/severity/associated sxs/prior Treatment) The history is provided by the patient.   Samantha Sandoval is a 20 y.o. female presenting with her fiance at bedside with complaint of lower abdominal/pelvic pain which is constant but is unable to describe any other characteristics of this pain along with increased urinary frequency and fatigue which started today.  She took a home pregnancy test today which was positive, her LMP was 08/10/15 and normal in duration.  She denies fevers, chills, nausea, vomiting, dysuria, hematuria, vaginal dscharge, diarrhea, dizziness, back or flank pain. She has found no alleviators and has taken no medicines prior to arrival.      History reviewed. No pertinent past medical history. Past Surgical History  Procedure Laterality Date  . 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 Abdominal surgery     No family history on file. Social History  Substance Use Topics  . Smoking status: Former Games developermoker  . Smokeless tobacco: None  . Alcohol Use: 0.0 oz/week    2-3 Shots of liquor per week     Comment: none   OB History    No data available     Review of Systems  Constitutional: Positive for fatigue. Negative for fever and chills.  HENT: Negative for congestion and sore throat.   Eyes: Negative.   Respiratory: Negative for chest tightness and shortness of breath.   Cardiovascular: Negative for chest pain.  Gastrointestinal: Positive for abdominal pain. Negative for nausea, vomiting, diarrhea and constipation.  Genitourinary: Positive for pelvic pain. Negative for vaginal discharge.  Musculoskeletal: Negative for joint swelling, arthralgias and neck pain.  Skin: Negative.  Negative for rash and wound.  Neurological: Negative for dizziness,  weakness, light-headedness, numbness and headaches.  Psychiatric/Behavioral: Negative.       Allergies  Review of patient's allergies indicates no known allergies.  Home Medications   Prior to Admission medications   Medication Sig Start Date End Date Taking? Authorizing Provider  metroNIDAZOLE (FLAGYL) 500 MG tablet Take 1 tablet (500 mg total) by mouth 2 (two) times daily. 09/10/15   Burgess AmorJulie Markee Matera, PA-C  naproxen (NAPROSYN) 500 MG tablet Take 1 tablet (500 mg total) by mouth 2 (two) times daily. Patient not taking: Reported on 05/27/2015 05/25/15   Donnetta HutchingBrian Cook, MD   BP 107/64 mmHg  Pulse 85  Temp(Src) 99 F (37.2 C) (Oral)  Resp 16  Ht 5\' 3"  (1.6 m)  Wt 81.647 kg  BMI 31.89 kg/m2  SpO2 99%  LMP 08/10/2015 Physical Exam  Constitutional: She appears well-developed and well-nourished.  HENT:  Head: Normocephalic and atraumatic.  Eyes: Conjunctivae are normal.  Neck: Normal range of motion.  Cardiovascular: Normal rate, regular rhythm, normal heart sounds and intact distal pulses.   Pulmonary/Chest: Effort normal and breath sounds normal. She has no wheezes.  Abdominal: Soft. Bowel sounds are normal. She exhibits no distension. There is tenderness. There is no rebound and no guarding.  Mild ttp suprapubic area without guarding.  No acute abdomen findings.  Genitourinary: Vagina normal and uterus normal. Uterus is not enlarged and not tender. Cervix exhibits no motion tenderness and no discharge. Right adnexum displays no mass, no tenderness and no fullness. Left adnexum displays no mass, no tenderness and no fullness. No vaginal discharge found.  Musculoskeletal: Normal range of motion.  Neurological: She is alert.  Skin: Skin is warm and dry.  Psychiatric: She has a normal mood and affect.  Nursing note and vitals reviewed.   ED Course  Procedures (including critical care time) Labs Review Labs Reviewed  WET PREP, GENITAL - Abnormal; Notable for the following:    Clue Cells  Wet Prep HPF POC PRESENT (*)    WBC, Wet Prep HPF POC FEW (*)    All other components within normal limits  URINALYSIS, ROUTINE W REFLEX MICROSCOPIC (NOT [facility]) - Abnormal; Notable for the following:    Specific Gravity, Urine >1.030 (*)    All other components within normal limits  PREGNANCY, URINE - Abnormal; Notable for the following:    Preg Test, Ur POSITIVE (*)    All other components within normal limits  HCG, QUANTITATIVE, PREGNANCY - Abnormal; Notable for the following:    hCG, Beta Chain, Quant, S 49 (*)    All other components within normal limits  CBC WITH DIFFERENTIAL/PLATELET  COMPREHENSIVE METABOLIC PANEL  GC/CHLAMYDIA PROBE AMP ([place]) [facility]    Imaging Review No results found. I have personally reviewed and evaluated these images and lab results as part of my medical decision-making.   EKG Interpretation None      MDM   Final diagnoses:  Pregnancy  Bacterial vaginosis    Patients  labs reviewed.    Results were also discussed with patient. No change in exam prior to dc.  Serial abd exam without acute abd findings.  Pt aware labs pendings (gc/chlamydia).  Advised prenatal vitamin, prescribed flagyl to treat bv. Prior to dc, states has appt with [facility] in 3 days to establish ob/gyn care.  Advised her she needs repeat hcg at this visit to confirm viable pregnancy, pt 2 weeks per hcg today.  The patient appears reasonably screened and/or stabilized for discharge and I doubt any other medical condition or other [facility] requiring further screening, evaluation, or treatment in the ED at this time prior to discharge.      Burgess AmorJulie Dakai Braithwaite, PA-C 09/10/15 1205  Dione Boozeavid Glick, MD 09/10/15 [phone]

## 2015-09-10 NOTE — ED Notes (Signed)
Pt reports fatigue and lower abd pain. States she could be pregnant.

## 2015-09-10 NOTE — ED Notes (Signed)
PA at bedside.

## 2015-09-13 LAB — GC/CHLAMYDIA PROBE AMP (~~LOC~~) NOT AT ARMC
Chlamydia: NEGATIVE
Neisseria Gonorrhea: NEGATIVE

## 2015-09-17 ENCOUNTER — Encounter: Payer: Self-pay | Admitting: Adult Health

## 2015-09-24 ENCOUNTER — Other Ambulatory Visit: Payer: Self-pay | Admitting: Obstetrics and Gynecology

## 2015-09-24 ENCOUNTER — Encounter (HOSPITAL_COMMUNITY): Payer: Self-pay | Admitting: Emergency Medicine

## 2015-09-24 ENCOUNTER — Emergency Department (HOSPITAL_COMMUNITY): Payer: Medicaid Other

## 2015-09-24 ENCOUNTER — Emergency Department (HOSPITAL_COMMUNITY)
Admission: EM | Admit: 2015-09-24 | Discharge: 2015-09-24 | Disposition: A | Discharge status: Home / Self Care | Payer: Medicaid Other | Attending: Emergency Medicine | Admitting: Emergency Medicine

## 2015-09-24 DIAGNOSIS — O3680X0 Pregnancy with inconclusive fetal viability, not applicable or unspecified: Secondary | ICD-10-CM

## 2015-09-24 DIAGNOSIS — R0600 Dyspnea, unspecified: Secondary | ICD-10-CM | POA: Insufficient documentation

## 2015-09-24 DIAGNOSIS — Z3A01 Less than 8 weeks gestation of pregnancy: Secondary | ICD-10-CM | POA: Insufficient documentation

## 2015-09-24 DIAGNOSIS — O26891 Other specified pregnancy related conditions, first trimester: Secondary | ICD-10-CM | POA: Insufficient documentation

## 2015-09-24 DIAGNOSIS — O26899 Other specified pregnancy related conditions, unspecified trimester: Secondary | ICD-10-CM

## 2015-09-24 DIAGNOSIS — Z87891 Personal history of nicotine dependence: Secondary | ICD-10-CM | POA: Insufficient documentation

## 2015-09-24 DIAGNOSIS — R102 Pelvic and perineal pain: Secondary | ICD-10-CM | POA: Diagnosis not present

## 2015-09-24 DIAGNOSIS — Z3490 Encounter for supervision of normal pregnancy, unspecified, unspecified trimester: Secondary | ICD-10-CM

## 2015-09-24 LAB — COMPREHENSIVE METABOLIC PANEL
ALT: 29 U/L (ref 14–54)
AST: 24 U/L (ref 15–41)
Albumin: 4 g/dL (ref 3.5–5.0)
Alkaline Phosphatase: 59 U/L (ref 38–126)
Anion gap: 5 (ref 5–15)
BUN: 6 mg/dL (ref 6–20)
CO2: 23 mmol/L (ref 22–32)
Calcium: 9.3 mg/dL (ref 8.9–10.3)
Chloride: 108 mmol/L (ref 101–111)
Creatinine, Ser: 0.7 mg/dL (ref 0.44–1.00)
GFR calc Af Amer: >60 mL/min (ref >60)
GFR calc non Af Amer: >60 mL/min (ref >60)
Glucose, Bld: 97 mg/dL (ref 65–99)
Potassium: 3.7 mmol/L (ref 3.5–5.1)
Sodium: 136 mmol/L (ref 135–145)
Total Bilirubin: 0.5 mg/dL (ref 0.3–1.2)
Total Protein: 6.2 g/dL — ABNORMAL LOW (ref 6.5–8.1)

## 2015-09-24 LAB — CBC
HCT: 39 % (ref 36.0–46.0)
Hemoglobin: 12.5 g/dL (ref 12.0–15.0)
MCH: 27.9 pg (ref 26.0–34.0)
MCHC: 32.1 g/dL (ref 30.0–36.0)
MCV: 87.1 fL (ref 78.0–100.0)
Platelets: 257 10*3/uL (ref 150–400)
RBC: 4.48 MIL/uL (ref 3.87–5.11)
RDW: 13.1 % (ref 11.5–15.5)
WBC: 7.9 10*3/uL (ref 4.0–10.5)

## 2015-09-24 LAB — I-STAT BETA HCG BLOOD, ED (MC, WL, AP ONLY): I-stat hCG, quantitative: >2000 m[IU]/mL — ABNORMAL HIGH (ref <5)

## 2015-09-24 LAB — URINALYSIS, ROUTINE W REFLEX MICROSCOPIC
Bilirubin Urine: NEGATIVE
Glucose, UA: NEGATIVE mg/dL
Hgb urine dipstick: NEGATIVE
Ketones, ur: NEGATIVE mg/dL
Nitrite: NEGATIVE
Protein, ur: NEGATIVE mg/dL
Specific Gravity, Urine: 1.018 (ref 1.005–1.030)
pH: 6 (ref 5.0–8.0)

## 2015-09-24 LAB — URINE MICROSCOPIC-ADD ON

## 2015-09-24 LAB — HCG, QUANTITATIVE, PREGNANCY: hCG, Beta Chain, Quant, S: 10612 m[IU]/mL — ABNORMAL HIGH (ref <5)

## 2015-09-24 LAB — LIPASE, BLOOD: Lipase: 25 U/L (ref 11–51)

## 2015-09-24 NOTE — ED Provider Notes (Signed)
CSN: [id number]     Arrival date & time 09/24/15  0212 History   First MD Initiated Contact with Patient 09/24/15 0410     Chief Complaint  Patient presents with  . Abdominal Pain  . Shortness of Breath  . Pregnant      Patient is a 20 y.o. female presenting with abdominal pain and shortness of breath. The history is provided by the patient.  Abdominal Pain Pain location:  LLQ and RLQ Pain quality: aching   Pain severity:  Mild Onset quality:  Gradual Duration: several hours. Timing:  Intermittent Progression:  Unchanged Chronicity:  New Relieved by:  Nothing Worsened by:  Nothing tried Associated symptoms: shortness of breath   Associated symptoms: no chest pain, no cough, no dysuria, no fever, no vaginal bleeding and no vomiting   Risk factors: pregnancy   Shortness of Breath Severity:  Mild Onset quality:  Sudden Timing:  Constant Progression:  Resolved Chronicity:  New Relieved by:  Nothing Worsened by:  Exertion Associated symptoms: abdominal pain   Associated symptoms: no chest pain, no cough, no fever, no hemoptysis and no vomiting   Risk factors: no hx of PE/DVT   patient reports earlier tonight she had onset of SOB - it was mild, no other symptoms (no fever/cp/back pain/hemoptysis No h/o any cardiac issues No h/o VTE Her SOB is now resolved  She also mentions abd pain - she first had LLQ pain, then had RLQ pain, now back in LLQ No vag bleeding No vomiting She is about [redacted] weeks pregnant   PMH - none Past Surgical History  Procedure Laterality Date  . 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 Abdominal surgery     No family history on file. Social History  Substance Use Topics  . Smoking status: Former Games developer  . Smokeless tobacco: None  . Alcohol Use: 0.0 oz/week    2-3 Shots of liquor per week     Comment: none   OB History    Gravida Para Term Preterm AB TAB SAB Ectopic Multiple Living   1              Review of Systems  Constitutional: Negative for fever.   Respiratory: Positive for shortness of breath. Negative for cough and hemoptysis.   Cardiovascular: Negative for chest pain.  Gastrointestinal: Positive for abdominal pain. Negative for vomiting.  Genitourinary: Negative for dysuria and vaginal bleeding.  All other systems reviewed and are negative.     Allergies  Review of patient's allergies indicates no known allergies.  Home Medications   Prior to Admission medications   Medication Sig Start Date End Date Taking? Authorizing Provider  metroNIDAZOLE (FLAGYL) 500 MG tablet Take 1 tablet (500 mg total) by mouth 2 (two) times daily. 09/10/15   Burgess Amor, PA-C  naproxen (NAPROSYN) 500 MG tablet Take 1 tablet (500 mg total) by mouth 2 (two) times daily. Patient not taking: Reported on 05/27/2015 05/25/15   Donnetta Hutching, MD   BP 108/65 mmHg  Pulse 95  Temp(Src) 97.9 F (36.6 C) (Oral)  Resp 18  SpO2 100%  LMP 08/10/2015 Physical Exam CONSTITUTIONAL: Well developed/well nourished HEAD: Normocephalic/atraumatic EYES: EOMI/PERRL ENMT: Mucous membranes moist NECK: supple no meningeal signs SPINE/BACK:entire spine nontender CV: S1/S2 noted, no murmurs/rubs/gallops noted LUNGS: Lungs are clear to auscultation bilaterally, no apparent distress ABDOMEN: soft, nontender, no rebound or guarding, bowel sounds noted throughout abdomen GU:no cva tenderness NEURO: Pt is awake/alert/appropriate, moves all extremitiesx4.  No facial droop.  EXTREMITIES: pulses normal/equal, full ROM, no LE edema or tenderness SKIN: warm, color normal PSYCH: no abnormalities of mood noted, alert and oriented to situation  ED Course  Procedures (including critical care time) Labs Review Labs Reviewed  COMPREHENSIVE METABOLIC PANEL - Abnormal; Notable for the following:    Total Protein 6.2 (*)    All other components within normal limits  URINALYSIS, ROUTINE W REFLEX MICROSCOPIC (NOT [facility]) - Abnormal; Notable for the following:    Leukocytes, UA TRACE  (*)    All other components within normal limits  URINE MICROSCOPIC-ADD ON - Abnormal; Notable for the following:    Squamous Epithelial / LPF 0-5 (*)    Bacteria, UA FEW (*)    All other components within normal limits  HCG, QUANTITATIVE, PREGNANCY - Abnormal; Notable for the following:    hCG, Beta Chain, Quant, S [id number] (*)    All other components within normal limits  I-STAT BETA HCG BLOOD, ED ([facility], [facility], [facility] ONLY) - Abnormal; Notable for the following:    I-stat hCG, quantitative >2000.0 (*)    All other components within normal limits  LIPASE, BLOOD  CBC    Imaging Review [place] Ob Comp Less 14 Wks  09/24/2015  CLINICAL DATA:  Pelvic pain, gestational age by last menstrual period 6 weeks and 3 days. EXAM: OBSTETRIC <14 WK ULTRASOUND TECHNIQUE: Transabdominal ultrasound was performed for evaluation of the gestation as well as the maternal uterus and adnexal regions. COMPARISON:  None. FINDINGS: Intrauterine gestational sac: Present Yolk sac:  Present Embryo:  Present Cardiac Activity: Present Heart Rate: Heart rate was not recorded on M-mode due to small fetal size though, is present. CRL:   2.5 mm  5 w 6 d                  [facility] Subchorionic hemorrhage:  None visualized. Maternal uterus/adnexae: 15 mm dominant follicle versus corpus luteal cyst LEFT ovary. No free fluid. IMPRESSION: Single live intrauterine pregnancy, gestational age by ultrasound 5 weeks and 6 days without immediate complication. Electronically Signed   By: Awilda Metroourtnay  Bloomer M.D.   On: 09/24/2015 05:43   [place] Ob Transvaginal  09/24/2015  CLINICAL DATA:  Pelvic pain, gestational age by last menstrual period 6 weeks and 3 days. EXAM: OBSTETRIC <14 WK ULTRASOUND TECHNIQUE: Transabdominal ultrasound was performed for evaluation of the gestation as well as the maternal uterus and adnexal regions. COMPARISON:  None. FINDINGS: Intrauterine gestational sac: Present Yolk sac:  Present Embryo:  Present Cardiac Activity:  Present Heart Rate: Heart rate was not recorded on M-mode due to small fetal size though, is present. CRL:   2.5 mm  5 w 6 d                  [facility] Subchorionic hemorrhage:  None visualized. Maternal uterus/adnexae: 15 mm dominant follicle versus corpus luteal cyst LEFT ovary. No free fluid. IMPRESSION: Single live intrauterine pregnancy, gestational age by ultrasound 5 weeks and 6 days without immediate complication. Electronically Signed   By: Awilda Metroourtnay  Bloomer M.D.   On: 09/24/2015 05:43   I have personally reviewed and evaluated these lab results as part of my medical decision-making.   EKG Interpretation   Date/Time:  Friday September 24 2015 02:17:41 EDT Ventricular Rate:  87 PR Interval:  116 QRS Duration: 72 QT Interval:  364 QTC Calculation: 438 R Axis:   41 Text Interpretation:  Normal sinus rhythm Cannot rule out  Anterior infarct  , age undetermined Abnormal ECG No significant change since last tracing  Confirmed by [facility]  MD, Joan Herschberger ([id number]) on 09/24/2015 4:15:31 AM     4:44 AM For SOB - EKG unremarkable, no CP reported, no hypoxia or tachycardia and symptoms resolved Will defer workup as PE/ACS/CHF unlikely  For abd pain - will need emergent 1st trimester ultrasound to evaluate for IUP I attempted bedside US without success Defer pelvic exam as she just had one on 7/6  5:57 AM US imaging confirms IUP Stable for d/c home  MDM   Final diagnoses:  Pregnancy  Dyspnea    Nursing notes including past medical history and social history reviewed and considered in documentation Labs/vital reviewed myself and considered during evaluation     Zadie Rhine, MD 09/24/15 [phone]

## 2015-09-24 NOTE — ED Notes (Signed)
Patient transported to Ultrasound 

## 2015-09-24 NOTE — ED Notes (Signed)
Pt is [redacted] weeks pregnant, reports SOB that started approx 1 hr ago, also reports L sided abd cramps. Denies N/V. Denies vaginal bleeding.

## 2015-09-24 NOTE — Discharge Instructions (Signed)

## 2015-09-24 NOTE — ED Notes (Signed)
Pt departed in NAD, refused use of wheelchair.  

## 2015-09-24 NOTE — ED Notes (Signed)
MD at bedside, performing Ultrasound.

## 2015-09-28 ENCOUNTER — Other Ambulatory Visit: Payer: Self-pay | Admitting: Obstetrics and Gynecology

## 2015-09-28 ENCOUNTER — Ambulatory Visit (INDEPENDENT_AMBULATORY_CARE_PROVIDER_SITE_OTHER): Payer: Medicaid Other

## 2015-09-28 DIAGNOSIS — O3680X0 Pregnancy with inconclusive fetal viability, not applicable or unspecified: Secondary | ICD-10-CM | POA: Diagnosis not present

## 2015-09-28 DIAGNOSIS — Z3A01 Less than 8 weeks gestation of pregnancy: Secondary | ICD-10-CM | POA: Diagnosis not present

## 2015-09-28 NOTE — Progress Notes (Signed)
Dating [place] today @ 7+[redacted] weeks GA by sure LMP. Single active fetus with FHR 116. YS visualized. CRL today 5.32mm which is consistent with LMP dating. Bilateral ovaries WNL. Cervix is closed.

## 2015-10-05 ENCOUNTER — Encounter (HOSPITAL_COMMUNITY): Payer: Self-pay

## 2015-10-05 ENCOUNTER — Emergency Department (HOSPITAL_COMMUNITY)
Admission: EM | Admit: 2015-10-05 | Discharge: 2015-10-05 | Disposition: A | Discharge status: Home / Self Care | Payer: Medicaid Other | Attending: Emergency Medicine | Admitting: Emergency Medicine

## 2015-10-05 DIAGNOSIS — Z3A08 8 weeks gestation of pregnancy: Secondary | ICD-10-CM | POA: Insufficient documentation

## 2015-10-05 DIAGNOSIS — O26891 Other specified pregnancy related conditions, first trimester: Secondary | ICD-10-CM | POA: Diagnosis not present

## 2015-10-05 DIAGNOSIS — O219 Vomiting of pregnancy, unspecified: Secondary | ICD-10-CM | POA: Insufficient documentation

## 2015-10-05 DIAGNOSIS — Z87891 Personal history of nicotine dependence: Secondary | ICD-10-CM | POA: Diagnosis not present

## 2015-10-05 DIAGNOSIS — R8271 Bacteriuria: Secondary | ICD-10-CM | POA: Diagnosis not present

## 2015-10-05 DIAGNOSIS — O9989 Other specified diseases and conditions complicating pregnancy, childbirth and the puerperium: Secondary | ICD-10-CM

## 2015-10-05 DIAGNOSIS — R112 Nausea with vomiting, unspecified: Secondary | ICD-10-CM

## 2015-10-05 DIAGNOSIS — Z3491 Encounter for supervision of normal pregnancy, unspecified, first trimester: Secondary | ICD-10-CM

## 2015-10-05 DIAGNOSIS — O99891 Other specified diseases and conditions complicating pregnancy: Secondary | ICD-10-CM

## 2015-10-05 LAB — URINALYSIS, ROUTINE W REFLEX MICROSCOPIC
Glucose, UA: NEGATIVE mg/dL
Hgb urine dipstick: NEGATIVE
Ketones, ur: >80 mg/dL — AB
Nitrite: NEGATIVE
Protein, ur: 30 mg/dL — AB
Specific Gravity, Urine: >1.03 — ABNORMAL HIGH (ref 1.005–1.030)
pH: 6 (ref 5.0–8.0)

## 2015-10-05 LAB — I-STAT CHEM 8, ED
BUN: 9 mg/dL (ref 6–20)
Calcium, Ion: 1.22 mmol/L (ref 1.13–1.30)
Chloride: 103 mmol/L (ref 101–111)
Creatinine, Ser: 0.6 mg/dL (ref 0.44–1.00)
Glucose, Bld: 108 mg/dL — ABNORMAL HIGH (ref 65–99)
HCT: 41 % (ref 36.0–46.0)
Hemoglobin: 13.9 g/dL (ref 12.0–15.0)
Potassium: 3.7 mmol/L (ref 3.5–5.1)
Sodium: 141 mmol/L (ref 135–145)
TCO2: 23 mmol/L (ref 0–100)

## 2015-10-05 LAB — URINE MICROSCOPIC-ADD ON

## 2015-10-05 LAB — PREGNANCY, URINE: Preg Test, Ur: POSITIVE — AB

## 2015-10-05 MED ORDER — ONDANSETRON HCL 4 MG/2ML IJ SOLN
4.0000 mg | Freq: Once | INTRAMUSCULAR | Status: AC
  Administered 2015-10-05: 4 mg via INTRAVENOUS
  Filled 2015-10-05: qty 2

## 2015-10-05 MED ORDER — DOXYLAMINE-PYRIDOXINE 10-10 MG PO TBEC: 1.0000 | DELAYED_RELEASE_TABLET | Freq: Four times a day (QID) | ORAL | 0 refills | Status: DC | PRN

## 2015-10-05 MED ORDER — SODIUM CHLORIDE 0.9 % IV BOLUS (SEPSIS)
1000.0000 mL | Freq: Once | INTRAVENOUS | Status: AC
  Administered 2015-10-05: 1000 mL via INTRAVENOUS

## 2015-10-05 MED ORDER — CEPHALEXIN 500 MG PO CAPS: 500.0000 mg | ORAL_CAPSULE | Freq: Two times a day (BID) | ORAL | 0 refills | Status: DC

## 2015-10-05 NOTE — Discharge Instructions (Signed)
You were seen in the ED today for nausea and vomiting. We treated your symptoms and have sent you home with a medication for nausea that is safe in pregnancy. Discuss these symptoms with your OB/Gyn tomorrow.   We also found some evidence of bacteria in your urine test today. We are treating this with 7 days of the antibiotic Keflex which is well tolerated in pregnancy. Take the full course of antibiotic to eliminate the infection fully.   Return to the ED with any worsening vomiting, abdominal pain, vaginal bleeding, or fever.

## 2015-10-05 NOTE — ED Provider Notes (Signed)
Emergency Department Provider Note   I have reviewed the triage vital signs and the nursing notes.   HISTORY  Chief Complaint Emesis   HPI Samantha Sandoval is a 20 y.o. female who is presently [redacted] weeks pregnant presents to the emergency department for evaluation of 2 days of vomiting and nausea. She reports multiple episodes of emesis both with eating and without. She denies abdominal pain, vaginal bleeding, vaginal discharge. She denies known sick contacts. She is unsure as to what she can take at home has tried bland foods with no relief in symptoms. She has established care with an OB/GYN and has an appointment to see them tomorrow. She denies associated diarrhea or fevers. No chest pain or difficulty breathing. She feels like her symptoms are gradually worsening and so presented to the emergency department.   History reviewed. No pertinent past medical history.  There are no active problems to display for this patient.   Past Surgical History:  Procedure Laterality Date  . ABDOMINAL SURGERY    . 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 Order #: [id number] Class: Historical Med  . Order #: [id number] Class: Print  . Order #: [id number] Class: Print    Allergies Review of patient's allergies indicates no known allergies.  No family history on file.  Social History Social History  Substance Use Topics  . Smoking status: Former Games developer  . Smokeless tobacco: Never Used  . Alcohol use No     Comment: none    Review of Systems  Constitutional: No fever/chills Eyes: No visual changes. ENT: No sore throat. Cardiovascular: Denies chest pain. Respiratory: Denies shortness of breath. Gastrointestinal: No abdominal pain. Positive nausea and vomiting.  No diarrhea.  No constipation. Genitourinary: Negative for dysuria. Musculoskeletal: Negative for back pain. Skin: Negative for rash. Neurological: Negative for headaches, focal weakness or numbness.  10-point  ROS otherwise negative.  ____________________________________________   PHYSICAL EXAM:  VITAL SIGNS: ED Triage Vitals [10/05/15 1539]  Enc Vitals Group     BP 117/65     Pulse Rate 98     Resp 16     Temp 98.2 F (36.8 C)     Temp Source Oral     SpO2 100 %     Weight 180 lb (81.6 kg)     Height  (1.6 m)   Constitutional: Alert and oriented. Well appearing and in no acute distress. Eyes: Conjunctivae are normal. PERRL. EOMI. Head: Atraumatic. Nose: No congestion/rhinnorhea. Mouth/Throat: Mucous membranes are dry.  Oropharynx non-erythematous. Neck: No stridor.   Cardiovascular: Normal rate, regular rhythm. Good peripheral circulation. Grossly normal heart sounds.   Respiratory: Normal respiratory effort.  No retractions. Lungs CTAB. Gastrointestinal: Soft and nontender. No distention.  Musculoskeletal: No lower extremity tenderness nor edema. No gross deformities of extremities. Neurologic:  Normal speech and language. No gross focal neurologic deficits are appreciated.  Skin:  Skin is warm, dry and intact. No rash noted. Psychiatric: Mood and affect are normal. Speech and behavior are normal.  ____________________________________________   LABS (all labs ordered are listed, but only abnormal results are displayed)  Labs Reviewed  URINALYSIS, ROUTINE W REFLEX MICROSCOPIC (NOT AT [facility]) - Abnormal; Notable for the following:       Result Value   APPearance HAZY (*)    Specific Gravity, Urine >1.030 (*)    Bilirubin Urine SMALL (*)    Ketones, ur >80 (*)    Protein, ur 30 (*)  Leukocytes, UA SMALL (*)    All other components within normal limits  PREGNANCY, URINE - Abnormal; Notable for the following:    Preg Test, Ur POSITIVE (*)    All other components within normal limits  URINE MICROSCOPIC-ADD ON - Abnormal; Notable for the following:    Squamous Epithelial / LPF TOO NUMEROUS TO COUNT (*)    Bacteria, UA MANY (*)    All other components within normal  limits  I-STAT CHEM 8, ED - Abnormal; Notable for the following:    Glucose, Bld 108 (*)    All other components within normal limits   ____________________________________________  RADIOLOGY  None ____________________________________________   PROCEDURES  Procedure(s) performed:   Procedures  None ____________________________________________   INITIAL IMPRESSION / ASSESSMENT AND PLAN / ED COURSE  Pertinent labs & imaging results that were available during my care of the patient were reviewed by me and considered in my medical decision making (see chart for details).  Patient resents to the emergency department for evaluation of nausea vomiting in the setting of being [redacted] weeks pregnant. This is her first pregnancy. No associated abdominal pain or vaginal bleeding. No fevers or shaking chills. The patient is extremely well appearing, sitting up in bed. She does have somewhat dry mucous membranes. Suspect mild to moderate dehydration with poor PO intake in the setting of vomiting. Plan for IV fluids and Zofran along with quick set of labs given multiple episodes of vomiting and potential electrolyte abnormalities or acid-base imbalance.   05:24 PM Patient feeling much better after IV fluids and nausea medication. She is tolerating fluids and feels ready to return home. Urinalysis shows evidence of asymptomatic bacteriuria. Given the patient's pregnancy status will treat with Keflex for 7 days. Plan to discharge home with Diclegis for nausea. Patient to see OB/Gyn tomorrow.  ____________________________________________  FINAL CLINICAL IMPRESSION(S) / ED DIAGNOSES  Final diagnoses:  Non-intractable vomiting with nausea, vomiting of unspecified type  First trimester pregnancy  Asymptomatic bacteriuria during pregnancy     MEDICATIONS GIVEN DURING THIS VISIT:  Medications  sodium chloride 0.9 % bolus 1,000 mL (0 mLs Intravenous Stopped 10/05/15 1728)  ondansetron (ZOFRAN)  injection 4 mg (4 mg Intravenous Given 10/05/15 1625)     NEW OUTPATIENT MEDICATIONS STARTED DURING THIS VISIT:  New Prescriptions   CEPHALEXIN (KEFLEX) 500 MG CAPSULE    Take 1 capsule (500 mg total) by mouth 2 (two) times daily.   DOXYLAMINE-PYRIDOXINE 10-10 MG TBEC    Take 1 tablet by mouth every 6 (six) hours as needed (nausea).      Note:  This document was prepared using Dragon voice recognition software and may include unintentional dictation errors.  Alona Bene, MD Emergency Medicine   Maia Plan, MD 10/05/15 [phone]

## 2015-10-05 NOTE — ED Triage Notes (Signed)
Pt reports that she is [redacted] weeks pregnant and started vomiting yesterday. 6 times yesterday and 4 times today, unable to hold anything down. Denies abd. pain

## 2015-10-05 NOTE — ED Notes (Signed)
Pt tolerating fluid PO challenge.

## 2015-10-06 ENCOUNTER — Encounter: Payer: Self-pay | Admitting: Advanced Practice Midwife

## 2015-10-06 ENCOUNTER — Other Ambulatory Visit (HOSPITAL_COMMUNITY)
Admission: RE | Admit: 2015-10-06 | Discharge: 2015-10-06 | Disposition: A | Discharge status: Home / Self Care | Payer: Medicaid Other | Source: Ambulatory Visit | Attending: Advanced Practice Midwife | Admitting: Advanced Practice Midwife

## 2015-10-06 ENCOUNTER — Ambulatory Visit (INDEPENDENT_AMBULATORY_CARE_PROVIDER_SITE_OTHER): Payer: Medicaid Other | Admitting: Advanced Practice Midwife

## 2015-10-06 VITALS — BP 114/70 | HR 78 | Wt 190.5 lb

## 2015-10-06 DIAGNOSIS — Z331 Pregnant state, incidental: Secondary | ICD-10-CM

## 2015-10-06 DIAGNOSIS — Z01411 Encounter for gynecological examination (general) (routine) with abnormal findings: Secondary | ICD-10-CM | POA: Insufficient documentation

## 2015-10-06 DIAGNOSIS — Z369 Encounter for antenatal screening, unspecified: Secondary | ICD-10-CM

## 2015-10-06 DIAGNOSIS — Z113 Encounter for screening for infections with a predominantly sexual mode of transmission: Secondary | ICD-10-CM | POA: Insufficient documentation

## 2015-10-06 DIAGNOSIS — Z3A09 9 weeks gestation of pregnancy: Secondary | ICD-10-CM | POA: Diagnosis not present

## 2015-10-06 DIAGNOSIS — Z3401 Encounter for supervision of normal first pregnancy, first trimester: Secondary | ICD-10-CM | POA: Diagnosis not present

## 2015-10-06 DIAGNOSIS — Z124 Encounter for screening for malignant neoplasm of cervix: Secondary | ICD-10-CM

## 2015-10-06 DIAGNOSIS — Z8041 Family history of malignant neoplasm of ovary: Secondary | ICD-10-CM | POA: Insufficient documentation

## 2015-10-06 DIAGNOSIS — Z3400 Encounter for supervision of normal first pregnancy, unspecified trimester: Secondary | ICD-10-CM | POA: Insufficient documentation

## 2015-10-06 DIAGNOSIS — Z3682 Encounter for antenatal screening for nuchal translucency: Secondary | ICD-10-CM

## 2015-10-06 DIAGNOSIS — Z1389 Encounter for screening for other disorder: Secondary | ICD-10-CM

## 2015-10-06 DIAGNOSIS — Z0283 Encounter for blood-alcohol and blood-drug test: Secondary | ICD-10-CM

## 2015-10-06 MED ORDER — DOXYLAMINE-PYRIDOXINE 10-10 MG PO TBEC: DELAYED_RELEASE_TABLET | ORAL | 3 refills | Status: DC

## 2015-10-06 NOTE — Progress Notes (Signed)
  Subjective:    Samantha Sandoval is a G1P0 [redacted]w[redacted]d being seen today for her first obstetrical visit.  Her obstetrical history is significant for first pregnanc7.  Pregnancy history fully reviewed.  Patient reports nausea She went to ED yesterday, got Rx for Diclegis and Keflex.  U/A is not definitely infected. Will await culture before starting keflex(pt asymptomatic).    Vitals:   10/06/15 1502  BP: 114/70  Pulse: 78  Weight: 190 lb 8 oz (86.4 kg)    HISTORY: OB History  Gravida Para Term Preterm AB Living  1            SAB TAB Ectopic Multiple Live Births               # Outcome Date GA Lbr Len/2nd Weight Sex Delivery Anes PTL Lv  1 Current              Past Medical History:  Diagnosis Date  . Anxiety    Past Surgical History:  Procedure Laterality Date  . ABDOMINAL SURGERY    . tubes in ears    . 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 Diabetes Maternal Grandmother   . Hypertension Maternal Grandmother   . COPD Maternal Grandmother   . Scoliosis Maternal Grandmother   . Heart disease Maternal Grandmother   . Congestive Heart Failure Neg Hx      Exam       Pelvic Exam:    Perineum: Normal Perineum   Vulva: normal   Vagina:  normal mucosa, normal discharge, no palpable nodules   Uterus Normal, Gravid, FH: 8     Cervix: normal   Adnexa: Not palpable   Urinary:  urethral meatus normal    System:     Skin: normal coloration and turgor, no rashes    Neurologic: oriented, normal, normal mood   Extremities: normal strength, tone, and muscle mass   HEENT PERRLA   Mouth/Teeth mucous membranes moist, normal dentition   Neck supple and no masses   Cardiovascular: regular rate and rhythm   Respiratory:  appears well, vitals normal, no respiratory distress, acyanotic   Abdomen: soft, non-tender;  FHR: 160US          Assessment:    Pregnancy: G1P0 Patient Active Problem List   Diagnosis Date Noted  . Supervision of  normal first pregnancy 10/06/2015  . Family history of ovarian cancer 10/06/2015        Plan:     Initial labs drawn. Continue prenatal vitamins  Problem list reviewed and updated  Reviewed n/v relief measures and warning s/s to report  Reviewed recommended weight gain based on pre-gravid BMI  Encouraged well-balanced diet Genetic Screening discussed Integrated Screen: requested.  Ultrasound discussed; fetal survey: requested.  Return in about 4 weeks (around 11/03/2015) for LROB, US:NT+1st IT.  CRESENZO-DISHMAN,Samantha Sandoval 10/06/2015

## 2015-10-06 NOTE — Patient Instructions (Signed)
 First Trimester of Pregnancy The first trimester of pregnancy is from week 1 until the end of week 12 (months 1 through 3). A week after a sperm fertilizes an egg, the egg will implant on the wall of the uterus. This embryo will begin to develop into a baby. Genes from you and your partner are forming the baby. The female genes determine whether the baby is a boy or a girl. At 6-8 weeks, the eyes and face are formed, and the heartbeat can be seen on ultrasound. At the end of 12 weeks, all the baby's organs are formed.  Now that you are pregnant, you will want to do everything you can to have a healthy baby. Two of the most important things are to get good prenatal care and to follow your health care provider's instructions. Prenatal care is all the medical care you receive before the baby's birth. This care will help prevent, find, and treat any problems during the pregnancy and childbirth. BODY CHANGES Your body goes through many changes during pregnancy. The changes vary from woman to woman.   You may gain or lose a couple of pounds at first.  You may feel sick to your stomach (nauseous) and throw up (vomit). If the vomiting is uncontrollable, call your health care provider.  You may tire easily.  You may develop headaches that can be relieved by medicines approved by your health care provider.  You may urinate more often. Painful urination may mean you have a bladder infection.  You may develop heartburn as a result of your pregnancy.  You may develop constipation because certain hormones are causing the muscles that push waste through your intestines to slow down.  You may develop hemorrhoids or swollen, bulging veins (varicose veins).  Your breasts may begin to grow larger and become tender. Your nipples may stick out more, and the tissue that surrounds them (areola) may become darker.  Your gums may bleed and may be sensitive to brushing and flossing.  Dark spots or blotches  (chloasma, mask of pregnancy) may develop on your face. This will likely fade after the baby is born.  Your menstrual periods will stop.  You may have a loss of appetite.  You may develop cravings for certain kinds of food.  You may have changes in your emotions from day to day, such as being excited to be pregnant or being concerned that something may go wrong with the pregnancy and baby.  You may have more vivid and strange dreams.  You may have changes in your hair. These can include thickening of your hair, rapid growth, and changes in texture. Some women also have hair loss during or after pregnancy, or hair that feels dry or thin. Your hair will most likely return to normal after your baby is born. WHAT TO EXPECT AT YOUR PRENATAL VISITS During a routine prenatal visit:  You will be weighed to make sure you and the baby are growing normally.  Your blood pressure will be taken.  Your abdomen will be measured to track your baby's growth.  The fetal heartbeat will be listened to starting around week 10 or 12 of your pregnancy.  Test results from any previous visits will be discussed. Your health care provider may ask you:  How you are feeling.  If you are feeling the baby move.  If you have had any abnormal symptoms, such as leaking fluid, bleeding, severe headaches, or abdominal cramping.  If you have any questions. Other   tests that may be performed during your first trimester include:  Blood tests to find your blood type and to check for the presence of any previous infections. They will also be used to check for low iron levels (anemia) and Rh antibodies. Later in the pregnancy, blood tests for diabetes will be done along with other tests if problems develop.  Urine tests to check for infections, diabetes, or protein in the urine.  An ultrasound to confirm the proper growth and development of the baby.  An amniocentesis to check for possible genetic problems.  Fetal  screens for spina bifida and Down syndrome.  You may need other tests to make sure you and the baby are doing well. HOME CARE INSTRUCTIONS  Medicines  Follow your health care provider's instructions regarding medicine use. Specific medicines may be either safe or unsafe to take during pregnancy.  Take your prenatal vitamins as directed.  If you develop constipation, try taking a stool softener if your health care provider approves. Diet  Eat regular, well-balanced meals. Choose a variety of foods, such as meat or vegetable-based protein, fish, milk and low-fat dairy products, vegetables, fruits, and whole grain breads and cereals. Your health care provider will help you determine the amount of weight gain that is right for you.  Avoid raw meat and uncooked cheese. These carry germs that can cause birth defects in the baby.  Eating four or five small meals rather than three large meals a day may help relieve nausea and vomiting. If you start to feel nauseous, eating a few soda crackers can be helpful. Drinking liquids between meals instead of during meals also seems to help nausea and vomiting.  If you develop constipation, eat more high-fiber foods, such as fresh vegetables or fruit and whole grains. Drink enough fluids to keep your urine clear or pale yellow. Activity and Exercise  Exercise only as directed by your health care provider. Exercising will help you:  Control your weight.  Stay in shape.  Be prepared for labor and delivery.  Experiencing pain or cramping in the lower abdomen or low back is a good sign that you should stop exercising. Check with your health care provider before continuing normal exercises.  Try to avoid standing for long periods of time. Move your legs often if you must stand in one place for a long time.  Avoid heavy lifting.  Wear low-heeled shoes, and practice good posture.  You may continue to have sex unless your health care provider directs you  otherwise. Relief of Pain or Discomfort  Wear a good support bra for breast tenderness.   Take warm sitz baths to soothe any pain or discomfort caused by hemorrhoids. Use hemorrhoid cream if your health care provider approves.   Rest with your legs elevated if you have leg cramps or low back pain.  If you develop varicose veins in your legs, wear support hose. Elevate your feet for 15 minutes, 3-4 times a day. Limit salt in your diet. Prenatal Care  Schedule your prenatal visits by the twelfth week of pregnancy. They are usually scheduled monthly at first, then more often in the last 2 months before delivery.  Write down your questions. Take them to your prenatal visits.  Keep all your prenatal visits as directed by your health care provider. Safety  Wear your seat belt at all times when driving.  Make a list of emergency phone numbers, including numbers for family, friends, the hospital, and police and fire departments. General   Tips  Ask your health care provider for a referral to a local prenatal education class. Begin classes no later than at the beginning of month 6 of your pregnancy.  Ask for help if you have counseling or nutritional needs during pregnancy. Your health care provider can offer advice or refer you to specialists for help with various needs.  Do not use hot tubs, steam rooms, or saunas.  Do not douche or use tampons or scented sanitary pads.  Do not cross your legs for long periods of time.  Avoid cat litter boxes and soil used by cats. These carry germs that can cause birth defects in the baby and possibly loss of the fetus by miscarriage or stillbirth.  Avoid all smoking, herbs, alcohol, and medicines not prescribed by your health care provider. Chemicals in these affect the formation and growth of the baby.  Schedule a dentist appointment. At home, brush your teeth with a soft toothbrush and be gentle when you floss. SEEK MEDICAL CARE IF:   You have  dizziness.  You have mild pelvic cramps, pelvic pressure, or nagging pain in the abdominal area.  You have persistent nausea, vomiting, or diarrhea.  You have a bad smelling vaginal discharge.  You have pain with urination.  You notice increased swelling in your face, hands, legs, or ankles. SEEK IMMEDIATE MEDICAL CARE IF:   You have a fever.  You are leaking fluid from your vagina.  You have spotting or bleeding from your vagina.  You have severe abdominal cramping or pain.  You have rapid weight gain or loss.  You vomit blood or material that looks like coffee grounds.  You are exposed to German measles and have never had them.  You are exposed to fifth disease or chickenpox.  You develop a severe headache.  You have shortness of breath.  You have any kind of trauma, such as from a fall or a car accident. Document Released: 02/14/2001 Document Revised: 07/07/2013 Document Reviewed: 12/31/2012 Exit[employer] Patient Information 2015 [employer]. This information is not intended to replace advice given to you by your health care provider. Make sure you discuss any questions you have with your health care provider.   Nausea & Vomiting  Have saltine crackers or pretzels by your bed and eat a few bites before you raise your head out of bed in the morning  Eat small frequent meals throughout the day instead of large meals  Drink plenty of fluids throughout the day to stay hydrated, just don't drink a lot of fluids with your meals.  This can make your stomach fill up faster making you feel sick  Do not brush your teeth right after you eat  Products with real ginger are good for nausea, like ginger ale and ginger hard candy Make sure it says made with real ginger!  Sucking on sour candy like lemon heads is also good for nausea  If your prenatal vitamins make you nauseated, take them at night so you will sleep through the nausea  Sea Bands  If you feel like you need  medicine for the nausea & vomiting please let us know  If you are unable to keep any fluids or food down please let us know   Constipation  Drink plenty of fluid, preferably water, throughout the day  Eat foods high in fiber such as fruits, vegetables, and grains  Exercise, such as walking, is a good way to keep your bowels regular  Drink warm fluids, especially warm   prune juice, or decaf coffee  Eat a 1/2 cup of real oatmeal (not instant), 1/2 cup applesauce, and 1/2-1 cup warm prune juice every day  If needed, you may take Colace (docusate sodium) stool softener once or twice a day to help keep the stool soft. If you are pregnant, wait until you are out of your first trimester (12-14 weeks of pregnancy)  If you still are having problems with constipation, you may take Miralax once daily as needed to help keep your bowels regular.  If you are pregnant, wait until you are out of your first trimester (12-14 weeks of pregnancy)  Safe Medications in Pregnancy   Acne: Benzoyl Peroxide Salicylic Acid  Backache/Headache: Tylenol: 2 regular strength every 4 hours OR              2 Extra strength every 6 hours  Colds/Coughs/Allergies: Benadryl (alcohol free) 25 mg every 6 hours as needed Breath right strips Claritin Cepacol throat lozenges Chloraseptic throat spray Cold-Eeze- up to three times per day Cough drops, alcohol free Flonase (by prescription only) Guaifenesin Mucinex Robitussin DM (plain only, alcohol free) Saline nasal spray/drops Sudafed (pseudoephedrine) & Actifed ** use only after [redacted] weeks gestation and if you do not have high blood pressure Tylenol Vicks Vaporub Zinc lozenges Zyrtec   Constipation: Colace Ducolax suppositories Fleet enema Glycerin suppositories Metamucil Milk of magnesia Miralax Senokot Smooth move tea  Diarrhea: Kaopectate Imodium A-D  *NO pepto Bismol  Hemorrhoids: Anusol Anusol HC Preparation  H Tucks  Indigestion: Tums Maalox Mylanta Zantac  Pepcid  Insomnia: Benadryl (alcohol free) 25mg every 6 hours as needed Tylenol PM Unisom, no Gelcaps  Leg Cramps: Tums MagGel  Nausea/Vomiting:  Bonine Dramamine Emetrol Ginger extract Sea bands Meclizine  Nausea medication to take during pregnancy:  Unisom (doxylamine succinate 25 mg tablets) Take one tablet daily at bedtime. If symptoms are not adequately controlled, the dose can be increased to a maximum recommended dose of two tablets daily (1/2 tablet in the morning, 1/2 tablet mid-afternoon and one at bedtime). Vitamin B6 100mg tablets. Take one tablet twice a day (up to 200 mg per day).  Skin Rashes: Aveeno products Benadryl cream or 25mg every 6 hours as needed Calamine Lotion 1% cortisone cream  Yeast infection: Gyne-lotrimin 7 Monistat 7   **If taking multiple medications, please check labels to avoid duplicating the same active ingredients **take medication as directed on the label ** Do not exceed 4000 mg of tylenol in 24 hours **Do not take medications that contain aspirin or ibuprofen      

## 2015-10-07 LAB — URINE CULTURE

## 2015-10-08 LAB — CYTOLOGY - PAP

## 2015-10-10 ENCOUNTER — Inpatient Hospital Stay (HOSPITAL_COMMUNITY)
Admission: AD | Admit: 2015-10-10 | Discharge: 2015-10-10 | Disposition: A | Discharge status: Home / Self Care | Payer: Medicaid Other | Source: Ambulatory Visit | Attending: Obstetrics & Gynecology | Admitting: Obstetrics & Gynecology

## 2015-10-10 ENCOUNTER — Encounter (HOSPITAL_COMMUNITY): Payer: Self-pay | Admitting: *Deleted

## 2015-10-10 DIAGNOSIS — Z3491 Encounter for supervision of normal pregnancy, unspecified, first trimester: Secondary | ICD-10-CM | POA: Diagnosis not present

## 2015-10-10 DIAGNOSIS — W228XXA Striking against or struck by other objects, initial encounter: Secondary | ICD-10-CM | POA: Insufficient documentation

## 2015-10-10 DIAGNOSIS — Y9316 Activity, rowing, canoeing, kayaking, rafting and tubing: Secondary | ICD-10-CM | POA: Diagnosis not present

## 2015-10-10 DIAGNOSIS — O9A211 Injury, poisoning and certain other consequences of external causes complicating pregnancy, first trimester: Secondary | ICD-10-CM | POA: Diagnosis not present

## 2015-10-10 DIAGNOSIS — Z3A08 8 weeks gestation of pregnancy: Secondary | ICD-10-CM | POA: Diagnosis not present

## 2015-10-10 DIAGNOSIS — Z8041 Family history of malignant neoplasm of ovary: Secondary | ICD-10-CM

## 2015-10-10 DIAGNOSIS — S3991XA Unspecified injury of abdomen, initial encounter: Secondary | ICD-10-CM | POA: Insufficient documentation

## 2015-10-10 LAB — URINE MICROSCOPIC-ADD ON

## 2015-10-10 LAB — URINALYSIS, ROUTINE W REFLEX MICROSCOPIC
Bilirubin Urine: NEGATIVE
Glucose, UA: NEGATIVE mg/dL
Hgb urine dipstick: NEGATIVE
Ketones, ur: NEGATIVE mg/dL
Nitrite: NEGATIVE
Protein, ur: NEGATIVE mg/dL
Specific Gravity, Urine: 1.025 (ref 1.005–1.030)
pH: 6 (ref 5.0–8.0)

## 2015-10-10 NOTE — MAU Provider Note (Signed)
S: Samantha Sandoval is a 20 y.o. year old G1P0 female at 8263w6d weeks gestation who presents to MAU reporting hitting her abd on rocks while tubing today. Denies VB. Continues to have mild abd pain. No other injuries.  O: Patient Vitals for the past 24 hrs:  BP Temp Temp src Pulse Resp SpO2 Height Weight  10/10/15 2353 116/66 - - 95 16 - - -  10/10/15 2245 110/62 98.6 F (37 C) Oral 81 16 100 % 5\' 3"  (1.6 m) 191 lb (86.6 kg)   General: NAD Heart; Reg rate Lungs: Reg rate and effort Abd: Soft. NT. No abrasions, bruises or lacerations.  Imaging Informal BS US shows ~9 weeks IUP. FHR 162.   A:  1. Traumatic injury during pregnancy in first trimester- w/out significant injury to pt. No pregnancy complications.    P:  D./C hoem in stable condition Comfort measures Instructed to avoid activities that put her ask risk of abd trauma while pregnant.  Follow-up Information    [facility] Follow up on 11/03/2015.   Why:  as scheduled Contact information: [address] [place] [phone]       [facility] .   Why:  as needed in emergencies Contact information: [address] [id number] [place] [phone]            Medication List    TAKE these medications   Doxylamine-Pyridoxine 10-10 MG Tbec 2 PO qhs; may take 1po in am and 1po in afternoon prn nausea   prenatal multivitamin Tabs tablet Take 1 tablet by mouth daily at 12 noon.      [place]ia Traves Majchrzak, CNM 10/11/2015 12:47 AM

## 2015-10-10 NOTE — Discharge Instructions (Signed)
What Do I Need to Know About Injuries During Pregnancy? °Injuries can happen during pregnancy. Minor falls and accidents usually do not harm you or your baby. However, any injury should be reported to your doctor. °WHAT CAN I DO TO PROTECT MYSELF FROM INJURIES? °· Remove rugs and loose objects on the floor. °· Wear comfortable shoes that have a good grip. Do not wear high-heeled shoes. °· Always wear your seat belt. The lap belt should be below your belly. Always practice safe driving. °· Do not ride on a motorcycle. °· Do not participate in high-impact activities or sports. °· Avoid: °¨ Walking on wet or slippery floors. °¨ Fires. °¨ Starting fires. °¨ Lifting heavy pots of boiling or hot liquids. °¨ Fixing electrical problems. °· Only take medicine as told by your doctor. °· Know your blood type and the blood type of the baby's father. °· Call your local emergency services (911 in the [place] if you are a victim of domestic violence or assault. For help and support, contact the National Domestic Violence Hotline. °WHEN SHOULD I GET HELP RIGHT AWAY? °· You fall on your belly or have any high-impact accident or injury. °· You have been a victim of domestic violence or any kind of violence. °· You have been in a car accident. °· You have bleeding from your vagina. °· Fluid is leaking from your vagina. °· You start to have belly cramping (contractions) or pain. °· You feel weak or pass out (faint). °· You start to throw up (vomit) after an injury. °· You have been burned. °· You have a stiff neck or neck pain. °· You get a headache or have vision problems after an injury. °· You do not feel the baby move or the baby is not moving as much as normal. °  °This information is not intended to replace advice given to you by your health care provider. Make sure you discuss any questions you have with your health care provider. °  °Document Released: 03/25/2010 Document Revised: 03/13/2014 Document Reviewed:  11/27/2012 °Elsevier Interactive Patient Education ©2016 Elsevier Inc. ° °

## 2015-10-10 NOTE — MAU Note (Signed)
Pt reports she fell and hit her stomach at about 8 this pm. Denies bleeding.

## 2015-10-13 ENCOUNTER — Encounter: Payer: Self-pay | Admitting: Advanced Practice Midwife

## 2015-10-13 DIAGNOSIS — R87612 Low grade squamous intraepithelial lesion on cytologic smear of cervix (LGSIL): Secondary | ICD-10-CM | POA: Insufficient documentation

## 2015-10-13 DIAGNOSIS — Z8742 Personal history of other diseases of the female genital tract: Secondary | ICD-10-CM | POA: Insufficient documentation

## 2015-10-13 LAB — ABO/RH: Rh Factor: POSITIVE

## 2015-10-13 LAB — PMP SCREEN PROFILE (10S), URINE
Amphetamine Screen, Ur: NEGATIVE ng/mL
Barbiturate Screen, Ur: NEGATIVE ng/mL
Benzodiazepine Screen, Urine: NEGATIVE ng/mL
Cannabinoids Ur Ql Scn: NEGATIVE ng/mL
Cocaine(Metab.)Screen, Urine: NEGATIVE ng/mL
Creatinine(Crt), U: 135.2 mg/dL (ref 20.0–300.0)
Methadone Scn, Ur: NEGATIVE ng/mL
Opiate Scrn, Ur: NEGATIVE ng/mL
Oxycodone+Oxymorphone Ur Ql Scn: NEGATIVE ng/mL
PCP Scrn, Ur: NEGATIVE ng/mL
Ph of Urine: 6.5 (ref 4.5–8.9)
Propoxyphene, Screen: NEGATIVE ng/mL

## 2015-10-13 LAB — URINALYSIS, ROUTINE W REFLEX MICROSCOPIC
Bilirubin, UA: NEGATIVE
Glucose, UA: NEGATIVE
Ketones, UA: NEGATIVE
Leukocytes, UA: NEGATIVE
Nitrite, UA: NEGATIVE
Protein, UA: NEGATIVE
RBC, UA: NEGATIVE
Specific Gravity, UA: 1.02 (ref 1.005–1.030)
Urobilinogen, Ur: 1 mg/dL (ref 0.2–1.0)
pH, UA: 7 (ref 5.0–7.5)

## 2015-10-13 LAB — CBC
Hematocrit: 39.2 % (ref 34.0–46.6)
Hemoglobin: 13 g/dL (ref 11.1–15.9)
MCH: 28.4 pg (ref 26.6–33.0)
MCHC: 33.2 g/dL (ref 31.5–35.7)
MCV: 86 fL (ref 79–97)
Platelets: 296 10*3/uL (ref 150–379)
RBC: 4.57 x10E6/uL (ref 3.77–5.28)
RDW: 13.6 % (ref 12.3–15.4)
WBC: 8 10*3/uL (ref 3.4–10.8)

## 2015-10-13 LAB — CYSTIC FIBROSIS MUTATION 97: Interpretation: NOT DETECTED

## 2015-10-13 LAB — RPR: RPR Ser Ql: NONREACTIVE

## 2015-10-13 LAB — HIV ANTIBODY (ROUTINE TESTING W REFLEX): HIV Screen 4th Generation wRfx: NONREACTIVE

## 2015-10-13 LAB — VARICELLA ZOSTER ANTIBODY, IGG: Varicella zoster IgG: 332 index (ref >165)

## 2015-10-13 LAB — ANTIBODY SCREEN: Antibody Screen: NEGATIVE

## 2015-10-13 LAB — RUBELLA SCREEN: Rubella Antibodies, IGG: 4.3 index (ref >0.99)

## 2015-10-13 LAB — HEPATITIS B SURFACE ANTIGEN: Hepatitis B Surface Ag: NEGATIVE

## 2015-11-03 ENCOUNTER — Ambulatory Visit (INDEPENDENT_AMBULATORY_CARE_PROVIDER_SITE_OTHER): Payer: Medicaid Other

## 2015-11-03 ENCOUNTER — Ambulatory Visit (INDEPENDENT_AMBULATORY_CARE_PROVIDER_SITE_OTHER): Payer: Medicaid Other | Admitting: Advanced Practice Midwife

## 2015-11-03 ENCOUNTER — Encounter: Payer: Self-pay | Admitting: Advanced Practice Midwife

## 2015-11-03 VITALS — BP 100/60 | HR 84 | Wt 191.5 lb

## 2015-11-03 DIAGNOSIS — Z3682 Encounter for antenatal screening for nuchal translucency: Secondary | ICD-10-CM

## 2015-11-03 DIAGNOSIS — Z331 Pregnant state, incidental: Secondary | ICD-10-CM

## 2015-11-03 DIAGNOSIS — Z36 Encounter for antenatal screening of mother: Secondary | ICD-10-CM | POA: Diagnosis not present

## 2015-11-03 DIAGNOSIS — Z3A13 13 weeks gestation of pregnancy: Secondary | ICD-10-CM | POA: Diagnosis not present

## 2015-11-03 DIAGNOSIS — Z8041 Family history of malignant neoplasm of ovary: Secondary | ICD-10-CM

## 2015-11-03 DIAGNOSIS — Z3401 Encounter for supervision of normal first pregnancy, first trimester: Secondary | ICD-10-CM

## 2015-11-03 DIAGNOSIS — R87612 Low grade squamous intraepithelial lesion on cytologic smear of cervix (LGSIL): Secondary | ICD-10-CM

## 2015-11-03 DIAGNOSIS — Z1389 Encounter for screening for other disorder: Secondary | ICD-10-CM

## 2015-11-03 DIAGNOSIS — Z369 Encounter for antenatal screening, unspecified: Secondary | ICD-10-CM

## 2015-11-03 LAB — POCT URINALYSIS DIPSTICK
Blood, UA: NEGATIVE
Ketones, UA: NEGATIVE
Leukocytes, UA: NEGATIVE
Nitrite, UA: NEGATIVE
Protein, UA: NEGATIVE

## 2015-11-03 LAB — POCT URINE PREGNANCY: Preg Test, Ur: POSITIVE — AB

## 2015-11-03 NOTE — Progress Notes (Signed)
US 12+2 wks,measurements c/w dates,normal ov's bilat,crl 56.7 mm,NB present,NT 1.6 mm

## 2015-11-03 NOTE — Progress Notes (Signed)
G1P0 6926w2d Estimated Date of Delivery: 05/15/16  Last menstrual period 08/09/2015.   BP weight and urine results all reviewed and noted.  Please refer to the obstetrical flow sheet for the fundal height and fetal heart rate documentation:   US 12+2 wks,measurements c/w dates,normal ov's bilat,crl 56.7 mm,NB present,NT 1.6 mm  Patient denies any bleeding and no rupture of membranes symptoms or regular contractions. Patient is without complaints. All questions were answered.  No orders of the defined types were placed in this encounter.   Plan:  Continued routine obstetrical care, NT/IT today  Return in about 4 weeks (around 12/01/2015) for LROB, 2nd IT, COLPO.

## 2015-11-03 NOTE — Patient Instructions (Addendum)

## 2015-11-05 LAB — MATERNAL SCREEN, INTEGRATED #1
Crown Rump Length: 56.7 mm
Gest. Age on Collection Date: 12.3 weeks
Maternal Age at EDD: 22.5 years
Nuchal Translucency (NT): 1.6 mm
Number of Fetuses: 1
PAPP-A Value: 567 ng/mL
PDF: 0
Weight: 192 [lb_av]

## 2015-11-11 ENCOUNTER — Emergency Department (HOSPITAL_COMMUNITY)
Admission: EM | Admit: 2015-11-11 | Discharge: 2015-11-11 | Disposition: A | Discharge status: Home / Self Care | Payer: Medicaid Other | Attending: Emergency Medicine | Admitting: Emergency Medicine

## 2015-11-11 ENCOUNTER — Encounter (HOSPITAL_COMMUNITY): Payer: Self-pay

## 2015-11-11 ENCOUNTER — Telehealth: Payer: Self-pay | Admitting: Advanced Practice Midwife

## 2015-11-11 DIAGNOSIS — R51 Headache: Secondary | ICD-10-CM | POA: Insufficient documentation

## 2015-11-11 DIAGNOSIS — O26891 Other specified pregnancy related conditions, first trimester: Secondary | ICD-10-CM | POA: Insufficient documentation

## 2015-11-11 DIAGNOSIS — Z3A13 13 weeks gestation of pregnancy: Secondary | ICD-10-CM | POA: Diagnosis not present

## 2015-11-11 DIAGNOSIS — R197 Diarrhea, unspecified: Secondary | ICD-10-CM | POA: Insufficient documentation

## 2015-11-11 DIAGNOSIS — O219 Vomiting of pregnancy, unspecified: Secondary | ICD-10-CM | POA: Diagnosis not present

## 2015-11-11 DIAGNOSIS — Z87891 Personal history of nicotine dependence: Secondary | ICD-10-CM | POA: Diagnosis not present

## 2015-11-11 DIAGNOSIS — R519 Headache, unspecified: Secondary | ICD-10-CM

## 2015-11-11 MED ORDER — METOCLOPRAMIDE HCL 10 MG PO TABS: 10.0000 mg | ORAL_TABLET | Freq: Four times a day (QID) | ORAL | 0 refills | Status: DC | PRN

## 2015-11-11 MED ORDER — SODIUM CHLORIDE 0.9 % IV BOLUS (SEPSIS)
1000.0000 mL | Freq: Once | INTRAVENOUS | Status: AC
  Administered 2015-11-11: 1000 mL via INTRAVENOUS

## 2015-11-11 MED ORDER — METOCLOPRAMIDE HCL 5 MG/ML IJ SOLN
10.0000 mg | Freq: Once | INTRAMUSCULAR | Status: AC
  Administered 2015-11-11: 10 mg via INTRAVENOUS
  Filled 2015-11-11: qty 2

## 2015-11-11 NOTE — ED Triage Notes (Signed)
Reports of 4 episodes of vomiting bright red blood this morning. Denies abdominal pain, or other pregnancy related complaints. [redacted] weeks pregnant per patient. Also complains of headache.

## 2015-11-11 NOTE — ED Notes (Signed)
[facility] to Dr. Sherri SearJacubawitz at [phone]

## 2015-11-11 NOTE — Discharge Instructions (Signed)
It is safe to take Tylenol for pain. The medication prescribed will help with headaches and also with nausea. Return if symptoms are not well-controlled or contact [facility].

## 2015-11-11 NOTE — ED Provider Notes (Signed)
[facility]PT Provider Note   CSN: [id number] Arrival date & time: 11/11/15  1220     History   Chief Complaint Chief Complaint  Patient presents with  . Hematemesis  . Headache    HPI Samantha Sandoval is a 20 y.o. female.  HPI  Past Medical History:  Diagnosis Date  . Anxiety   Complains of left-sided headache gradual onset 9 AM today after awakening. Headache is throbbing in nature accompanied by nausea and vomiting. She's vomited 4 times yellowish material mixed with small amount of blood. She also had one episode of diarrhea last episode of diarrhea was yesterday. She denies abdominal pain. Denies fever. No treatment prior to coming here. Presently complains of headache and nausea no other associated symptoms  Patient Active Problem List   Diagnosis Date Noted  . 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 Supervision of normal first pregnancy 10/06/2015  . Family history of ovarian cancer 10/06/2015    Past Surgical History:  Procedure Laterality Date  . ABDOMINAL SURGERY    . tubes in ears    . tumor 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      OB History    Gravida Para Term Preterm AB Living   1             SAB TAB Ectopic Multiple Live Births                   Home Medications    Prior to Admission medications   Medication Sig Start Date End Date Taking? Authorizing Provider  Doxylamine-Pyridoxine 10-10 MG TBEC 2 PO qhs; may take 1po in am and 1po in afternoon prn nausea 10/06/15   Jacklyn Shell, CNM    Family History Family History  Problem Relation Age of Onset  . Diabetes Maternal Grandmother   . Hypertension Maternal Grandmother   . COPD Maternal Grandmother   . Scoliosis Maternal Grandmother   . Heart disease Maternal Grandmother   . Hypertension Maternal Grandfather   . Other Maternal Grandfather     2 stents in heart  . Heart attack Paternal Grandfather   . Congestive Heart Failure Neg Hx      Social History Social History  Substance Use Topics  . Smoking status: Former Smoker    Types: Cigarettes  . Smokeless tobacco: Never Used  . Alcohol use No     Comment: none     Allergies   Review of patient's allergies indicates no known allergies.   Review of Systems Review of Systems  Constitutional: Negative.   Respiratory: Negative.   Cardiovascular: Negative.   Gastrointestinal: Positive for diarrhea, nausea and vomiting.  Genitourinary:       [redacted] weeks pregnant  Musculoskeletal: Negative.   Skin: Negative.   Neurological: Positive for headaches.  Psychiatric/Behavioral: Negative.   All other systems reviewed and are negative.    Physical Exam Updated Vital Signs BP 113/64 (BP Location: Left Arm)   Pulse 88   Temp 98.5 F (36.9 C) (Oral)   Resp 16   Ht 5\' 3"  (1.6 m)   Wt 191 lb (86.6 kg)   LMP 08/09/2015   SpO2 100%   BMI 33.83 kg/m   Physical Exam  Constitutional: She is oriented to person, place, and time. She appears well-developed and well-nourished. No distress.  HENT:  Head: Normocephalic and atraumatic.  Eyes: Conjunctivae are normal. Pupils are equal, round, and reactive to light.  Neck: Neck supple. No tracheal  deviation present. No thyromegaly present.  Cardiovascular: Normal rate and regular rhythm.   No murmur heard. Pulmonary/Chest: Effort normal and breath sounds normal.  Abdominal: Soft. Bowel sounds are normal. She exhibits no distension. There is no tenderness.  Fetal heart tones 145  Musculoskeletal: Normal range of motion. She exhibits no edema or tenderness.  Neurological: She is alert and oriented to person, place, and time. She has normal reflexes. Coordination normal.  Gait normal Romberg normal pronator drift normal DTR symmetric bilaterally at knee jerk ankle jerk and biceps toes downward going bilaterally  Skin: Skin is warm and dry. No rash noted.  Psychiatric: She has a normal mood and affect.  Nursing note and  vitals reviewed.    ED Treatments / Results  Labs (all labs ordered are listed, but only abnormal results are displayed) Labs Reviewed - No data to display  EKG  EKG Interpretation None       Radiology No results found.  Procedures Procedures (including critical care time)  Medications Ordered in ED Medications  sodium chloride 0.9 % bolus 1,000 mL (not administered)  metoCLOPramide (REGLAN) injection 10 mg (not administered)     Initial Impression / Assessment and Plan / ED Course  I have reviewed the triage vital signs and the nursing notes.  Pertinent labs & imaging results that were available during my care of the patient were reviewed by me and considered in my medical decision making (see chart for details).  Clinical Course  Comment By Time  Asymptomatic feels much improved after treatment with intravenous Reglan. Nausea has resolved and headache has resolved patient is alert and in no distress Doug SouSam Preslie Depasquale, MD 09/07 1331   1:35 PM patient able to drink water without difficulty alert and ambulatory and in no distress  [facility] with [facility].Cresenzo. [facility]. Plan prescription Reglan. Follow up as return as needed no further workup needed Final Clinical Impressions(s) / ED Diagnoses  Diagnosis #1 nonspecific headache #2 hematemesis Final diagnoses:  None    New Prescriptions New Prescriptions   No medications on file     Doug SouSam Hydee Fleece, MD 11/11/15 1340

## 2015-11-11 NOTE — Telephone Encounter (Signed)
[facility] and given Reglan.

## 2015-11-25 ENCOUNTER — Telehealth: Payer: Self-pay | Admitting: *Deleted

## 2015-11-25 MED ORDER — ONDANSETRON 4 MG PO TBDP: 4.0000 mg | ORAL_TABLET | Freq: Three times a day (TID) | ORAL | 1 refills | Status: DC | PRN

## 2015-11-25 NOTE — Telephone Encounter (Signed)
Left message that [facility]fran [facility]

## 2015-11-25 NOTE — Telephone Encounter (Signed)
Diclegis is not helping with nausea/vomiting. Pt requesting a RX for Zofran.

## 2015-12-01 ENCOUNTER — Encounter: Payer: Medicaid Other | Admitting: Obstetrics and Gynecology

## 2015-12-06 ENCOUNTER — Encounter: Payer: Medicaid Other | Admitting: Obstetrics and Gynecology

## 2015-12-15 ENCOUNTER — Other Ambulatory Visit: Payer: Self-pay | Admitting: Obstetrics and Gynecology

## 2015-12-15 ENCOUNTER — Ambulatory Visit (INDEPENDENT_AMBULATORY_CARE_PROVIDER_SITE_OTHER): Payer: Medicaid Other | Admitting: Obstetrics and Gynecology

## 2015-12-15 VITALS — BP 122/70 | HR 86 | Wt 184.4 lb

## 2015-12-15 DIAGNOSIS — N870 Mild cervical dysplasia: Secondary | ICD-10-CM

## 2015-12-15 DIAGNOSIS — Z3682 Encounter for antenatal screening for nuchal translucency: Secondary | ICD-10-CM

## 2015-12-15 DIAGNOSIS — Z1389 Encounter for screening for other disorder: Secondary | ICD-10-CM

## 2015-12-15 DIAGNOSIS — Z331 Pregnant state, incidental: Secondary | ICD-10-CM

## 2015-12-15 DIAGNOSIS — Z3400 Encounter for supervision of normal first pregnancy, unspecified trimester: Secondary | ICD-10-CM

## 2015-12-15 DIAGNOSIS — Z3401 Encounter for supervision of normal first pregnancy, first trimester: Secondary | ICD-10-CM

## 2015-12-15 DIAGNOSIS — R87619 Unspecified abnormal cytological findings in specimens from cervix uteri: Secondary | ICD-10-CM

## 2015-12-15 DIAGNOSIS — Z3A18 18 weeks gestation of pregnancy: Secondary | ICD-10-CM

## 2015-12-15 LAB — POCT URINALYSIS DIPSTICK
Blood, UA: NEGATIVE
Glucose, UA: NEGATIVE
Leukocytes, UA: NEGATIVE
Nitrite, UA: NEGATIVE

## 2015-12-15 NOTE — Progress Notes (Signed)
  Fransico Michaelachel McKinney 20 y.o. G1P0 here for colposcopy for  LOW GRADE SQUAMOUS INTRAEPITHELIAL LESION: CIN-1/ HPV (LSIL).  pap smear on 10/06/15  Discussed role for HPV in cervical dysplasia, need for surveillance.  Patient given informed consent, signed copy in the chart, time out was performed.  Placed in lithotomy position. Cervix viewed with speculum and colposcope after application of acetic acid.   Colposcopy adequate? Yes  Entire transition zone has white punctation, biopsies obtained at 6 o'clock.   All specimens were labelled and sent to pathology.   Colposcopy IMPRESSION: Extensive CIN- 1,  Biopsy taken at 6 oclock, and tx'd with monsels to control bleeding.will follow up pathology,  and treat after pregnancy most likely.  Patient was given post procedure instructions. Will follow up pathology and manage accordingly.  Routine preventative health maintenance measures emphasized.  By signing my name below, I, Sonum Patel, attest that this documentation has been prepared under the direction and in the presence of Tilda BurrowJohn V Joal Eakle, MD. Electronically Signed: Sonum Patel, Neurosurgeoncribe. 12/15/15. 2:37 PM.  I personally performed the services described in this documentation, which was SCRIBED in my presence. The recorded information has been reviewed and considered accurate. It has been edited as necessary during review. Tilda BurrowFERGUSON,Jolon Degante V, MD

## 2015-12-17 LAB — MATERNAL SCREEN, INTEGRATED #2
AFP MoM: 1.54
Alpha-Fetoprotein: 54.2 ng/mL
Crown Rump Length: 56.7 mm
DIA MoM: 0.81
DIA Value: 123.7 pg/mL
Estriol, Unconjugated: 1.57 ng/mL
Gest. Age on Collection Date: 12.3 weeks
Gestational Age: 18.3 weeks
Maternal Age at EDD: 22.5 years
Nuchal Translucency (NT): 1.6 mm
Nuchal Translucency MoM: 1.09
Number of Fetuses: 1
PAPP-A MoM: 0.87
PAPP-A Value: 567 ng/mL
Test Results:: NEGATIVE
Weight: 192 [lb_av]
Weight: 192 [lb_av]
hCG MoM: 0.44
hCG Value: 9 IU/mL
uE3 MoM: 1.27

## 2015-12-21 ENCOUNTER — Other Ambulatory Visit: Payer: Self-pay | Admitting: Obstetrics and Gynecology

## 2015-12-21 DIAGNOSIS — Z363 Encounter for antenatal screening for malformations: Secondary | ICD-10-CM

## 2015-12-22 ENCOUNTER — Ambulatory Visit (INDEPENDENT_AMBULATORY_CARE_PROVIDER_SITE_OTHER): Payer: Medicaid Other | Admitting: Advanced Practice Midwife

## 2015-12-22 ENCOUNTER — Ambulatory Visit (INDEPENDENT_AMBULATORY_CARE_PROVIDER_SITE_OTHER): Payer: Medicaid Other

## 2015-12-22 ENCOUNTER — Encounter: Payer: Self-pay | Admitting: Advanced Practice Midwife

## 2015-12-22 VITALS — BP 110/60 | HR 84 | Wt 185.4 lb

## 2015-12-22 DIAGNOSIS — Z3A20 20 weeks gestation of pregnancy: Secondary | ICD-10-CM

## 2015-12-22 DIAGNOSIS — Z363 Encounter for antenatal screening for malformations: Secondary | ICD-10-CM | POA: Diagnosis not present

## 2015-12-22 DIAGNOSIS — Z331 Pregnant state, incidental: Secondary | ICD-10-CM | POA: Diagnosis not present

## 2015-12-22 DIAGNOSIS — Z3402 Encounter for supervision of normal first pregnancy, second trimester: Secondary | ICD-10-CM

## 2015-12-22 DIAGNOSIS — Z23 Encounter for immunization: Secondary | ICD-10-CM

## 2015-12-22 DIAGNOSIS — Z1389 Encounter for screening for other disorder: Secondary | ICD-10-CM

## 2015-12-22 LAB — POCT URINALYSIS DIPSTICK
Blood, UA: NEGATIVE
Glucose, UA: NEGATIVE
Ketones, UA: NEGATIVE
Leukocytes, UA: NEGATIVE
Nitrite, UA: NEGATIVE

## 2015-12-22 NOTE — Progress Notes (Signed)
G1P0 6638w2d Estimated Date of Delivery: 05/15/16  Blood pressure 110/60, pulse 84, weight 185 lb 6.4 oz (84.1 kg), last menstrual period 08/09/2015.   BP weight and urine results all reviewed and noted.  Please refer to the obstetrical flow sheet for the fundal height and fetal heart rate documentation:   US 19+2 wks,cephalic,post pl gr 0,cx 3.1 cm,normal ov's bilat,svp of fluid 3.7 cm,fhr 143 bpm,efw 264 g,anatomy complete,no obvious abnormalities  Patient reports good fetal movement, denies any bleeding and no rupture of membranes symptoms or regular contractions. Patient is without complaints. All questions were answered.  Orders Placed This Encounter  Procedures  . POCT urinalysis dipstick    Plan:  Continued routine obstetrical care,   Return in about 4 weeks (around 01/19/2016) for LROB.

## 2015-12-22 NOTE — Progress Notes (Addendum)
US 19+2 wks,cephalic,post pl gr 0,cx 3.1 cm,normal ov's bilat,svp of fluid 3.7 cm,fhr 143 bpm,efw 264 g,anatomy complete,no obvious abnormalities

## 2016-01-17 ENCOUNTER — Encounter (HOSPITAL_COMMUNITY): Payer: Self-pay

## 2016-01-17 ENCOUNTER — Inpatient Hospital Stay (HOSPITAL_COMMUNITY)
Admission: AD | Admit: 2016-01-17 | Discharge: 2016-01-17 | Disposition: A | Discharge status: Home / Self Care | Payer: Medicaid Other | Source: Ambulatory Visit | Attending: Obstetrics & Gynecology | Admitting: Obstetrics & Gynecology

## 2016-01-17 DIAGNOSIS — Z87891 Personal history of nicotine dependence: Secondary | ICD-10-CM | POA: Insufficient documentation

## 2016-01-17 DIAGNOSIS — R109 Unspecified abdominal pain: Secondary | ICD-10-CM | POA: Insufficient documentation

## 2016-01-17 DIAGNOSIS — Z3A23 23 weeks gestation of pregnancy: Secondary | ICD-10-CM | POA: Insufficient documentation

## 2016-01-17 DIAGNOSIS — O26892 Other specified pregnancy related conditions, second trimester: Secondary | ICD-10-CM | POA: Diagnosis not present

## 2016-01-17 DIAGNOSIS — Z3402 Encounter for supervision of normal first pregnancy, second trimester: Secondary | ICD-10-CM

## 2016-01-17 DIAGNOSIS — R87612 Low grade squamous intraepithelial lesion on cytologic smear of cervix (LGSIL): Secondary | ICD-10-CM

## 2016-01-17 DIAGNOSIS — Z8041 Family history of malignant neoplasm of ovary: Secondary | ICD-10-CM

## 2016-01-17 LAB — URINALYSIS, ROUTINE W REFLEX MICROSCOPIC
Bilirubin Urine: NEGATIVE
Glucose, UA: NEGATIVE mg/dL
Hgb urine dipstick: NEGATIVE
Ketones, ur: NEGATIVE mg/dL
Nitrite: NEGATIVE
Protein, ur: NEGATIVE mg/dL
Specific Gravity, Urine: 1.02 (ref 1.005–1.030)
pH: 5.5 (ref 5.0–8.0)

## 2016-01-17 LAB — URINE MICROSCOPIC-ADD ON

## 2016-01-17 LAB — WET PREP, GENITAL
Clue Cells Wet Prep HPF POC: NONE SEEN
Sperm: NONE SEEN
Trich, Wet Prep: NONE SEEN
Yeast Wet Prep HPF POC: NONE SEEN

## 2016-01-17 NOTE — Discharge Instructions (Signed)
Second Trimester of Pregnancy  The second trimester is from week 13 through week 28, month 4 through 6. This is often the time in pregnancy that you feel your best. Often times, morning sickness has lessened or quit. You may have more energy, and you may get hungry more often. Your unborn baby (fetus) is growing rapidly. At the end of the sixth month, he or she is about 9 inches long and weighs about 1½ pounds. You will likely feel the baby move (quickening) between 18 and 20 weeks of pregnancy.  HOME CARE   · Avoid all smoking, herbs, and alcohol. Avoid drugs not approved by your doctor.  · Do not use any tobacco products, including cigarettes, chewing tobacco, and electronic cigarettes. If you need help quitting, ask your doctor. You may get counseling or other support to help you quit.  · Only take medicine as told by your doctor. Some medicines are safe and some are not during pregnancy.  · Exercise only as told by your doctor. Stop exercising if you start having cramps.  · Eat regular, healthy meals.  · Wear a good support bra if your breasts are tender.  · Do not use hot tubs, steam rooms, or saunas.  · Wear your seat belt when driving.  · Avoid raw meat, uncooked cheese, and liter boxes and soil used by cats.  · Take your prenatal vitamins.  · Take 1500-2000 milligrams of calcium daily starting at the 20th week of pregnancy until you deliver your baby.  · Try taking medicine that helps you poop (stool softener) as needed, and if your doctor approves. Eat more fiber by eating fresh fruit, vegetables, and whole grains. Drink enough fluids to keep your pee (urine) clear or pale yellow.  · Take warm water baths (sitz baths) to soothe pain or discomfort caused by hemorrhoids. Use hemorrhoid cream if your doctor approves.  · If you have puffy, bulging veins (varicose veins), wear support hose. Raise (elevate) your feet for 15 minutes, 3-4 times a day. Limit salt in your diet.  · Avoid heavy lifting, wear low heals,  and sit up straight.  · Rest with your legs raised if you have leg cramps or low back pain.  · Visit your dentist if you have not gone during your pregnancy. Use a soft toothbrush to brush your teeth. Be gentle when you floss.  · You can have sex (intercourse) unless your doctor tells you not to.  · Go to your doctor visits.  GET HELP IF:   · You feel dizzy.  · You have mild cramps or pressure in your lower belly (abdomen).  · You have a nagging pain in your belly area.  · You continue to feel sick to your stomach (nauseous), throw up (vomit), or have watery poop (diarrhea).  · You have bad smelling fluid coming from your vagina.  · You have pain with peeing (urination).  GET HELP RIGHT AWAY IF:   · You have a fever.  · You are leaking fluid from your vagina.  · You have spotting or bleeding from your vagina.  · You have severe belly cramping or pain.  · You lose or gain weight rapidly.  · You have trouble catching your breath and have chest pain.  · You notice sudden or extreme puffiness (swelling) of your face, hands, ankles, feet, or legs.  · You have not felt the baby move in over an hour.  · You have severe headaches that do   not go away with medicine.  · You have vision changes.     This information is not intended to replace advice given to you by your health care provider. Make sure you discuss any questions you have with your health care provider.     Document Released: 05/17/2009 Document Revised: 03/13/2014 Document Reviewed: 04/23/2012  Elsevier Interactive Patient Education ©2016 [employer]

## 2016-01-17 NOTE — MAU Note (Signed)
Pt presents complaining of lower abdominal cramping that started 15 minutes ago. Denies bleeding or leaking. Has not felt the baby move since waking up today.

## 2016-01-17 NOTE — MAU Provider Note (Signed)
MAU HISTORY AND PHYSICAL  Chief Complaint:  Abdominal Pain   Samantha Sandoval is a 20 y.o.  G1P0  at 3064w0d presenting for Abdominal Pain . Patient states she has been having  none contractions, none vaginal bleeding, intact membranes, with active fetal movement.    Started feeling cramping 15 minutes before coming in to be seen. Was sitting down and started feeling cramps in lower left side of abdomen where baby usually lies. Last intercourse 4 days ago. Denies dysuria, abnormal discharge. Does not drink much water, has had 1 bottle in past 24 hours. Rates discomfort from cramping at 7/10 currently.   Past Medical History:  Diagnosis Date  . Anxiety     Past Surgical History:  Procedure Laterality Date  . ABDOMINAL SURGERY    . tubes in ears    . 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 Diabetes Maternal Grandmother   . Hypertension Maternal Grandmother   . COPD Maternal Grandmother   . Scoliosis Maternal Grandmother   . Heart disease Maternal Grandmother   . Hypertension Maternal Grandfather   . Other Maternal Grandfather     2 stents in heart  . Heart attack Paternal Grandfather   . Congestive Heart Failure Neg Hx     Social History  Substance Use Topics  . Smoking status: Former Smoker    Types: Cigarettes  . Smokeless tobacco: Never Used  . Alcohol use No     Comment: none    No Known Allergies  Prescriptions Prior to Admission  Medication Sig Dispense Refill Last Dose  . Doxylamine-Pyridoxine 10-10 MG TBEC 2 PO qhs; may take 1po in am and 1po in afternoon prn nausea 120 tablet 3 Taking    Review of Systems - Negative except for what is mentioned in HPI.  Physical Exam  Blood pressure 111/56, pulse 79, temperature 98.3 F (36.8 C), temperature source Oral, resp. rate 18, last menstrual period 08/09/2015. GENERAL: Well-developed, well-nourished female in no acute distress.  LUNGS: No respiratory distress HEART: Regular  rate ABDOMEN: Soft, nontender, nondistended, gravid.  EXTREMITIES: Nontender, no edema, 2+ distal pulses. GU: moderate amount of white discharge present, cervix visually closed FHT:  Baseline 145, moderate variability, accelerations present, no decelerations seen Contractions: none by toco     Labs: No results found for this or any previous visit (from the past 24 hour(s)).  Imaging Studies:  [place] Ob Comp + 14 Wk  Result Date: 12/22/2015  SECOND TRIMESTER SONOGRAM Samantha MichaelRachel Sandoval is in the office for second trimester sonogram. She is a 20 y.o. year old G1P0 with Estimated Date of Delivery: 05/15/16 by LMP now at  3536w2d weeks gestation. Thus far the pregnancy has been uncomplicated. GESTATION: SINGLETON PRESENTATION: cephalic FETAL ACTIVITY:          Heart rate         143          The fetus is active. AMNIOTIC FLUID: The amniotic fluid volume is  normal, SDP : 3.7 cm. PLACENTA LOCALIZATION:  posterior GRADE 0 CERVIX: Measures 3.1 cm ADNEXA: The ovaries are normal. GESTATIONAL AGE AND  BIOMETRICS: Gestational criteria: Estimated Date of Delivery: 05/15/16 by LMP now at 936w2d Previous Scans:3          BIPARIETAL DIAMETER           4.06 cm         18+2 weeks HEAD CIRCUMFERENCE  16.13 cm         19 weeks ABDOMINAL CIRCUMFERENCE           13.43 cm         18+6 weeks FEMUR LENGTH           2.91 cm         19 weeks                                                       AVERAGE EGA(BY THIS SCAN):  18+5 weeks                                                 ESTIMATED FETAL WEIGHT:       264  grams ANATOMICAL SURVEY                                                                            COMMENTS CEREBRAL VENTRICLES yes normal  CHOROID PLEXUS yes normal  CEREBELLUM yes normal  CISTERNA MAGNA yes normal  NUCHAL REGION yes normal  ORBITS yes normal  NASAL BONE yes normal  NOSE/LIP yes normal  FACIAL PROFILE yes normal  4 CHAMBERED HEART yes normal  OUTFLOW TRACTS yes normal  DIAPHRAGM yes normal  STOMACH yes  normal  RENAL REGION yes normal  BLADDER yes normal  CORD INSERTION yes normal  3 VESSEL CORD yes normal  SPINE yes normal  ARMS/HANDS yes normal  LEGS/FEET yes normal  GENITALIA yes normal Female      SUSPECTED ABNORMALITIES:  no QUALITY OF SCAN: satisfactory TECHNICIAN COMMENTS: US 19+2 wks,cephalic,post pl gr 0,cx 3.1 cm,normal ov's bilat,svp of fluid 3.7 cm,fhr 143 bpm,efw 264 g,anatomy complete,no obvious abnormalities A copy of this report including all images has been saved and backed up to a second source for retrieval if needed. All measures and details of the anatomical scan, placentation, fluid volume and pelvic anatomy are contained in that report. Samantha Sandoval 12/22/2015 3:03 PM Clinical Impression and recommendations: I have reviewed the sonogram results above, combined with the patient's current clinical course, below are my impressions and any appropriate recommendations for management based on the sonographic findings. 1.  G1P0 Estimated Date of Delivery: 05/15/16 by  LMP and confirmed by today's sonographic dating 2.  Normal fetal sonographic findings, specifically normal detailed anatomical evaluation,      no abnormalities noted 3.  Normal general sonographic findings Recommend routine prenatal care based on this sonogram or as clinically indicated Samantha Sandoval 12/22/2015 5:10 PM    Assessment: Samantha Sandoval is  20 y.o. G1P0 at 6951w0d presents with Abdominal Pain . MDM UA Wet prep GC/Ct  Plan:  Discharge home Category 1 FHT, no contractions on monitor Increase water intake Return precautions given Follow up outpatient as scheduled 11/15 Patient verbalized understanding and agreement with plan  Samantha SersAngela C Riccio, DO PGY-1 11/13/20171:54 AM

## 2016-01-18 LAB — CULTURE, OB URINE

## 2016-01-18 LAB — GC/CHLAMYDIA PROBE AMP (~~LOC~~) NOT AT ARMC
Chlamydia: NEGATIVE
Neisseria Gonorrhea: NEGATIVE

## 2016-01-19 ENCOUNTER — Encounter: Payer: Self-pay | Admitting: Advanced Practice Midwife

## 2016-01-19 ENCOUNTER — Ambulatory Visit (INDEPENDENT_AMBULATORY_CARE_PROVIDER_SITE_OTHER): Payer: Medicaid Other | Admitting: Advanced Practice Midwife

## 2016-01-19 VITALS — BP 100/52 | HR 86 | Wt 191.0 lb

## 2016-01-19 DIAGNOSIS — R87612 Low grade squamous intraepithelial lesion on cytologic smear of cervix (LGSIL): Secondary | ICD-10-CM

## 2016-01-19 DIAGNOSIS — Z3402 Encounter for supervision of normal first pregnancy, second trimester: Secondary | ICD-10-CM

## 2016-01-19 DIAGNOSIS — Z331 Pregnant state, incidental: Secondary | ICD-10-CM

## 2016-01-19 DIAGNOSIS — Z3A23 23 weeks gestation of pregnancy: Secondary | ICD-10-CM

## 2016-01-19 DIAGNOSIS — Z363 Encounter for antenatal screening for malformations: Secondary | ICD-10-CM

## 2016-01-19 DIAGNOSIS — Z1389 Encounter for screening for other disorder: Secondary | ICD-10-CM

## 2016-01-19 LAB — POCT URINALYSIS DIPSTICK
Blood, UA: NEGATIVE
Glucose, UA: NEGATIVE
Ketones, UA: NEGATIVE
Leukocytes, UA: NEGATIVE
Nitrite, UA: NEGATIVE
Protein, UA: NEGATIVE

## 2016-01-19 NOTE — Patient Instructions (Signed)

## 2016-01-19 NOTE — Progress Notes (Signed)
G1P0 6162w2d Estimated Date of Delivery: 05/15/16  Blood pressure (!) 100/52, pulse 86, weight 191 lb (86.6 kg), last menstrual period 08/09/2015.   BP weight and urine results all reviewed and noted.  Please refer to the obstetrical flow sheet for the fundal height and fetal heart rate documentation:  Patient reports good fetal movement, denies any bleeding and no rupture of membranes symptoms or regular contractions. Patient is without complaints. All questions were answered.  Orders Placed This Encounter  Procedures  . US OB Comp + 14 Wk  . POCT urinalysis dipstick    Plan:  Continued routine obstetrical care,   Return in about 4 weeks (around 02/16/2016) for PN2/LROB.

## 2016-01-22 ENCOUNTER — Emergency Department (HOSPITAL_COMMUNITY)
Admission: EM | Admit: 2016-01-22 | Discharge: 2016-01-22 | Disposition: A | Discharge status: Home / Self Care | Payer: Medicaid Other | Attending: Emergency Medicine | Admitting: Emergency Medicine

## 2016-01-22 ENCOUNTER — Encounter (HOSPITAL_COMMUNITY): Payer: Self-pay | Admitting: Oncology

## 2016-01-22 DIAGNOSIS — O26892 Other specified pregnancy related conditions, second trimester: Secondary | ICD-10-CM | POA: Insufficient documentation

## 2016-01-22 DIAGNOSIS — Z3A23 23 weeks gestation of pregnancy: Secondary | ICD-10-CM | POA: Insufficient documentation

## 2016-01-22 DIAGNOSIS — Z87891 Personal history of nicotine dependence: Secondary | ICD-10-CM | POA: Insufficient documentation

## 2016-01-22 DIAGNOSIS — R55 Syncope and collapse: Secondary | ICD-10-CM | POA: Diagnosis not present

## 2016-01-22 LAB — CBC
HCT: 36.4 % (ref 36.0–46.0)
Hemoglobin: 12.1 g/dL (ref 12.0–15.0)
MCH: 28.8 pg (ref 26.0–34.0)
MCHC: 33.2 g/dL (ref 30.0–36.0)
MCV: 86.7 fL (ref 78.0–100.0)
Platelets: 229 10*3/uL (ref 150–400)
RBC: 4.2 MIL/uL (ref 3.87–5.11)
RDW: 13.5 % (ref 11.5–15.5)
WBC: 10.1 10*3/uL (ref 4.0–10.5)

## 2016-01-22 NOTE — Discharge Instructions (Signed)
It was our pleasure to provide your ER care today - we hope that you feel better.  Your blood work looks good/normal.   Rest. Drink plenty of fluids/adequate water.  Follow up with your doctor in the next couple weeks.  Return to ER if worse, new symptoms, fainting, fevers, other concern.

## 2016-01-22 NOTE — ED Triage Notes (Signed)
Pt is [redacted] weeks pregnant.  States she was on WebMD and believes her iron is low d/t her having cold hands/feet. Also one incident of being dizzy and lightheaded in the [employer] which resolved as soon as she got outside into the cold.  Denies pain.

## 2016-01-22 NOTE — ED Notes (Signed)
Patient denies pain and is resting comfortably.  

## 2016-01-22 NOTE — ED Provider Notes (Signed)
[facility]-EMERGENCY DEPT Provider Note   CSN: [id number] Arrival date & time: 01/22/16  0609     History   Chief Complaint Chief Complaint  Patient presents with  . Wants Iron Checked    HPI Samantha PonsRachel Sandoval is a 20 y.o. female.  Patient is [redacted] weeks pregnant, c/o feeling faint/lightheaded 2 days ago - was at a store at the time, standing. No loc. Denies any associated palpitations, irregular heart beat, chest discomfort or sob. States wants her blood count/iron checked. No hx anemia. Denies any blood loss - no vaginal or rectal bleeding. No melena. No complications w pregnancy, [place]. No abd or pelvic pain. No contractions or cramping. Denies dysuria or gu c/o. No fever or chills.    The history is provided by the patient.    Past Medical History:  Diagnosis Date  . Anxiety     Patient Active Problem List   Diagnosis Date Noted  . 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 Supervision of normal first pregnancy 10/06/2015  . Family history of ovarian cancer 10/06/2015    Past Surgical History:  Procedure Laterality Date  . ABDOMINAL SURGERY    . tubes in ears    . tumor 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      OB History    Gravida Para Term Preterm AB Living   1             SAB TAB Ectopic Multiple Live Births                   Home Medications    Prior to Admission medications   Medication Sig Start Date End Date Taking? Authorizing Provider  Doxylamine-Pyridoxine 10-10 MG TBEC 2 PO qhs; may take 1po in am and 1po in afternoon prn nausea 10/06/15   Jacklyn ShellFrances Cresenzo-Dishmon, CNM    Family History Family History  Problem Relation Age of Onset  . Diabetes Maternal Grandmother   . Hypertension Maternal Grandmother   . COPD Maternal Grandmother   . Scoliosis Maternal Grandmother   . Heart disease Maternal Grandmother   . Hypertension Maternal Grandfather   . Other Maternal Grandfather     2 stents in heart  . Heart  attack Paternal Grandfather   . Congestive Heart Failure Neg Hx     Social History Social History  Substance Use Topics  . Smoking status: Former Smoker    Types: Cigarettes  . Smokeless tobacco: Never Used  . Alcohol use No     Comment: none     Allergies   Patient has no known allergies.   Review of Systems Review of Systems  Constitutional: Negative for fever.  HENT: Negative for sore throat.   Eyes: Negative for redness.  Respiratory: Negative for cough.   Cardiovascular: Negative for chest pain and palpitations.  Gastrointestinal: Negative for abdominal pain.  Genitourinary: Negative for dysuria.  Musculoskeletal: Negative for back pain.  Skin: Negative for rash.  Neurological: Negative for headaches.  Hematological: Does not bruise/bleed easily.  Psychiatric/Behavioral: Negative for confusion.     Physical Exam Updated Vital Signs BP 113/72 (BP Location: Left Arm)   Pulse 96   Temp 98 F (36.7 C) (Oral)   Resp 16   Ht 5\' 2"  (1.575 m)   Wt 86.6 kg   LMP 08/09/2015   SpO2 97%   BMI 34.93 kg/m   Physical Exam  Constitutional: She appears well-developed and well-nourished. No distress.  HENT:  Mouth/Throat:  Oropharynx is clear and moist.  Eyes: Conjunctivae are normal. Pupils are equal, round, and reactive to light. No scleral icterus.  Neck: Neck supple. No tracheal deviation present.  Cardiovascular: Normal rate, regular rhythm, normal heart sounds and intact distal pulses.   No murmur heard. Pulmonary/Chest: Effort normal and breath sounds normal. No respiratory distress.  Abdominal: Soft. Normal appearance. She exhibits no distension. There is no tenderness.  Musculoskeletal: She exhibits no edema.  Neurological: She is alert.  Skin: Skin is warm and dry. No rash noted. She is not diaphoretic.  Psychiatric: She has a normal mood and affect.  Nursing note and vitals reviewed.    ED Treatments / Results  Labs (all labs ordered are listed, but  only abnormal results are displayed) Labs Reviewed  CBC   Results for orders placed or performed during the hospital encounter of 01/22/16  CBC  Result Value Ref Range   WBC 10.1 4.0 - 10.5 K/uL   RBC 4.20 3.87 - 5.11 MIL/uL   Hemoglobin 12.1 12.0 - 15.0 g/dL   HCT 16.136.4 09.636.0 - 04.546.0 %   MCV 86.7 78.0 - 100.0 fL   MCH 28.8 26.0 - 34.0 pg   MCHC 33.2 30.0 - 36.0 g/dL   RDW 40.913.5 81.111.5 - 91.415.5 %   Platelets 229 150 - 400 K/uL    EKG  EKG Interpretation None       Radiology No results found.  Procedures Procedures (including critical care time)  Medications Ordered in ED Medications - No data to display   Initial Impression / Assessment and Plan / ED Course  I have reviewed the triage vital signs and the nursing notes.  Pertinent labs & imaging results that were available during my care of the patient were reviewed by me and considered in my medical decision making (see chart for details).  Clinical Course     Labs sent.   Po fluids.  Patient denies pain. No current faintness or dizziness.  Reviewed nursing notes and prior charts for additional history.   Discussed labs w pt.   Pt currently appears stable for d/c.     Final Clinical Impressions(s) / ED Diagnoses   Final diagnoses:  None    New Prescriptions New Prescriptions   No medications on file     Cathren LaineKevin Mammie Meras, MD 01/22/16 0900

## 2016-01-22 NOTE — ED Notes (Addendum)
Phlebotomy at bedside.

## 2016-01-30 ENCOUNTER — Encounter (HOSPITAL_COMMUNITY): Payer: Self-pay | Admitting: Emergency Medicine

## 2016-01-30 DIAGNOSIS — R102 Pelvic and perineal pain: Secondary | ICD-10-CM | POA: Diagnosis not present

## 2016-01-30 DIAGNOSIS — O26892 Other specified pregnancy related conditions, second trimester: Secondary | ICD-10-CM | POA: Insufficient documentation

## 2016-01-30 DIAGNOSIS — Z3A25 25 weeks gestation of pregnancy: Secondary | ICD-10-CM | POA: Diagnosis not present

## 2016-01-30 DIAGNOSIS — Z79899 Other long term (current) drug therapy: Secondary | ICD-10-CM | POA: Insufficient documentation

## 2016-01-30 DIAGNOSIS — Z87891 Personal history of nicotine dependence: Secondary | ICD-10-CM | POA: Diagnosis not present

## 2016-01-30 DIAGNOSIS — R1084 Generalized abdominal pain: Secondary | ICD-10-CM | POA: Diagnosis not present

## 2016-01-30 NOTE — ED Triage Notes (Signed)
C/o pelvic pain since yesterday and pressure with urination.  Pt states she is [redacted] weeks pregnant.

## 2016-01-31 ENCOUNTER — Emergency Department (HOSPITAL_COMMUNITY): Payer: Medicaid Other

## 2016-01-31 ENCOUNTER — Emergency Department (HOSPITAL_COMMUNITY)
Admission: EM | Admit: 2016-01-31 | Discharge: 2016-01-31 | Disposition: A | Discharge status: Home / Self Care | Payer: Medicaid Other | Attending: Emergency Medicine | Admitting: Emergency Medicine

## 2016-01-31 DIAGNOSIS — R109 Unspecified abdominal pain: Secondary | ICD-10-CM

## 2016-01-31 DIAGNOSIS — O26892 Other specified pregnancy related conditions, second trimester: Secondary | ICD-10-CM

## 2016-01-31 DIAGNOSIS — R52 Pain, unspecified: Secondary | ICD-10-CM

## 2016-01-31 LAB — BASIC METABOLIC PANEL
Anion gap: 7 (ref 5–15)
BUN: 6 mg/dL (ref 6–20)
CO2: 20 mmol/L — ABNORMAL LOW (ref 22–32)
Calcium: 8.8 mg/dL — ABNORMAL LOW (ref 8.9–10.3)
Chloride: 108 mmol/L (ref 101–111)
Creatinine, Ser: 0.55 mg/dL (ref 0.44–1.00)
GFR calc Af Amer: >60 mL/min (ref >60)
GFR calc non Af Amer: >60 mL/min (ref >60)
Glucose, Bld: 91 mg/dL (ref 65–99)
Potassium: 3.6 mmol/L (ref 3.5–5.1)
Sodium: 135 mmol/L (ref 135–145)

## 2016-01-31 LAB — HEPATIC FUNCTION PANEL
ALT: 21 U/L (ref 14–54)
AST: 22 U/L (ref 15–41)
Albumin: 2.9 g/dL — ABNORMAL LOW (ref 3.5–5.0)
Alkaline Phosphatase: 60 U/L (ref 38–126)
Bilirubin, Direct: <0.1 mg/dL — ABNORMAL LOW (ref 0.1–0.5)
Total Bilirubin: 0.2 mg/dL — ABNORMAL LOW (ref 0.3–1.2)
Total Protein: 6 g/dL — ABNORMAL LOW (ref 6.5–8.1)

## 2016-01-31 LAB — CBC
HCT: 33.8 % — ABNORMAL LOW (ref 36.0–46.0)
Hemoglobin: 11.4 g/dL — ABNORMAL LOW (ref 12.0–15.0)
MCH: 28.9 pg (ref 26.0–34.0)
MCHC: 33.7 g/dL (ref 30.0–36.0)
MCV: 85.8 fL (ref 78.0–100.0)
Platelets: 222 10*3/uL (ref 150–400)
RBC: 3.94 MIL/uL (ref 3.87–5.11)
RDW: 13.4 % (ref 11.5–15.5)
WBC: 9.2 10*3/uL (ref 4.0–10.5)

## 2016-01-31 LAB — URINALYSIS, ROUTINE W REFLEX MICROSCOPIC
Bilirubin Urine: NEGATIVE
Glucose, UA: NEGATIVE mg/dL
Ketones, ur: NEGATIVE mg/dL
Nitrite: NEGATIVE
Protein, ur: NEGATIVE mg/dL
Specific Gravity, Urine: 1.022 (ref 1.005–1.030)
pH: 5.5 (ref 5.0–8.0)

## 2016-01-31 LAB — URINE MICROSCOPIC-ADD ON

## 2016-01-31 LAB — HCG, QUANTITATIVE, PREGNANCY: hCG, Beta Chain, Quant, S: 2860 m[IU]/mL — ABNORMAL HIGH (ref <5)

## 2016-01-31 MED ORDER — ACETAMINOPHEN 500 MG PO TABS
1000.0000 mg | ORAL_TABLET | Freq: Once | ORAL | Status: AC
  Administered 2016-01-31: 1000 mg via ORAL
  Filled 2016-01-31: qty 2

## 2016-01-31 MED ORDER — ACETAMINOPHEN 500 MG PO TABS: 500.0000 mg | ORAL_TABLET | Freq: Four times a day (QID) | ORAL | 0 refills | Status: DC | PRN

## 2016-01-31 NOTE — ED Notes (Signed)
OB RN at bedside

## 2016-01-31 NOTE — ED Notes (Addendum)
[facility]  NS notified to call [facility] nurse

## 2016-01-31 NOTE — ED Notes (Signed)
Patient transported to Ultrasound 

## 2016-01-31 NOTE — ED Provider Notes (Signed)
[facility]-EMERGENCY DEPT Provider Note   CSN: [id number] Arrival date & time: 01/30/16  2314     History   Chief Complaint Chief Complaint  Patient presents with  . Pelvic Pain    [redacted] weeks pregnant    HPI Samantha Sandoval is a 20 y.o. female.  The patient is a 20 y/o G1P0 female, approximately 25w pregnant, who presents to the ED for evaluation of abdominal pain. Patient states the pain began yesterday and worsened this evening. She describes the pain as cramping and states that it is primarily suprapubic. Patient cannot identify any modifying factors of her symptoms. She has not taken any medication for symptoms prior to arrival. Patient does report some nausea, urinary pressure and frequency. She has not had any hematuria, vaginal bleeding, or vaginal discharge. Patient states that her cheeks were flushed yesterday, but she is unsure if she has had a temperature over 100.41F.  OBGYN - Dr. Emelda FearFerguson      Past Medical History:  Diagnosis Date  . Anxiety     Patient Active Problem List   Diagnosis Date Noted  . 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 Supervision of normal first pregnancy 10/06/2015  . Family history of ovarian cancer 10/06/2015    Past Surgical History:  Procedure Laterality Date  . ABDOMINAL SURGERY    . tubes in ears    . tumor 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      OB History    Gravida Para Term Preterm AB Living   1             SAB TAB Ectopic Multiple Live Births                   Home Medications    Prior to Admission medications   Medication Sig Start Date End Date Taking? Authorizing Provider  acetaminophen (TYLENOL) 500 MG tablet Take 1 tablet (500 mg total) by mouth every 6 (six) hours as needed. 01/31/16   Antony MaduraKelly Zayvon Alicea, PA-C  Doxylamine-Pyridoxine 10-10 MG TBEC 2 PO qhs; may take 1po in am and 1po in afternoon prn nausea 10/06/15   Jacklyn ShellFrances Cresenzo-Dishmon, CNM    Family History Family History    Problem Relation Age of Onset  . Diabetes Maternal Grandmother   . Hypertension Maternal Grandmother   . COPD Maternal Grandmother   . Scoliosis Maternal Grandmother   . Heart disease Maternal Grandmother   . Hypertension Maternal Grandfather   . Other Maternal Grandfather     2 stents in heart  . Heart attack Paternal Grandfather   . Congestive Heart Failure Neg Hx     Social History Social History  Substance Use Topics  . Smoking status: Former Smoker    Types: Cigarettes  . Smokeless tobacco: Never Used  . Alcohol use No     Comment: none     Allergies   Patient has no known allergies.   Review of Systems Review of Systems Ten systems reviewed and are negative for acute change, except as noted in the HPI.    Physical Exam Updated Vital Signs BP 100/62   Pulse 78   Temp 98.3 F (36.8 C) (Oral)   Resp 18   LMP 08/09/2015   SpO2 98%   Physical Exam  Constitutional: She is oriented to person, place, and time. She appears well-developed and well-nourished. No distress.  Nontoxic and in NAD  HENT:  Head: Normocephalic and atraumatic.  Eyes: Conjunctivae and EOM are  normal. No scleral icterus.  Neck: Normal range of motion.  Cardiovascular: Normal rate, regular rhythm and intact distal pulses.   Pulmonary/Chest: Effort normal. No respiratory distress. She has no wheezes. She has no rales.  Respirations even and unlabored. Lungs CTAB.  Abdominal: Soft. She exhibits no mass. There is tenderness. There is no guarding.  Patient with tenderness to palpation of the suprapubic abdomen as well as the left upper quadrant, left lower quadrant, and epigastrium. Abdomen is soft. There is no guarding. No masses or peritoneal signs.  Musculoskeletal: Normal range of motion.  Neurological: She is alert and oriented to person, place, and time. She exhibits normal muscle tone. Coordination normal.  Skin: Skin is warm and dry. No rash noted. She is not diaphoretic. No erythema.  No pallor.  Psychiatric: She has a normal mood and affect. Her behavior is normal.  Nursing note and vitals reviewed.    ED Treatments / Results  Labs (all labs ordered are listed, but only abnormal results are displayed) Labs Reviewed  URINALYSIS, ROUTINE W REFLEX MICROSCOPIC (NOT AT [facility] - Abnormal; Notable for the following:       Result Value   APPearance CLOUDY (*)    Hgb urine dipstick SMALL (*)    Leukocytes, UA MODERATE (*)    All other components within normal limits  URINE MICROSCOPIC-ADD ON - Abnormal; Notable for the following:    Squamous Epithelial / LPF 0-5 (*)    Bacteria, UA FEW (*)    All other components within normal limits  HCG, QUANTITATIVE, PREGNANCY - Abnormal; Notable for the following:    hCG, Beta Chain, Quant, S 2,860 (*)    All other components within normal limits  CBC - Abnormal; Notable for the following:    Hemoglobin 11.4 (*)    HCT 33.8 (*)    All other components within normal limits  BASIC METABOLIC PANEL - Abnormal; Notable for the following:    CO2 20 (*)    Calcium 8.8 (*)    All other components within normal limits  HEPATIC FUNCTION PANEL - Abnormal; Notable for the following:    Total Protein 6.0 (*)    Albumin 2.9 (*)    Total Bilirubin 0.2 (*)    Bilirubin, Direct <0.1 (*)    All other components within normal limits  URINE CULTURE    EKG  EKG Interpretation None       Radiology US Abdomen Complete  Result Date: 01/31/2016 CLINICAL DATA:  20 y/o F; right upper and lower quadrant abdominal pain since yesterday. Pregnant patient. EXAM: ABDOMEN ULTRASOUND COMPLETE COMPARISON:  None. FINDINGS: Gallbladder: Contracted gallbladder. No secondary signs of cholecystitis. Common bile duct: Diameter: 3 mm Liver: No focal lesion identified. Within normal limits in parenchymal echogenicity. IVC: No abnormality visualized. Pancreas: Obscured by bowel gas. Spleen: Size and appearance within normal limits. Right Kidney: Length: 10.9.  Echogenicity within normal limits. No mass. Mild right-sided hydronephrosis. Left Kidney: Length: 10.9. Echogenicity within normal limits. No mass or hydronephrosis visualized. Abdominal aorta: No aneurysm visualized. Other findings: None. IMPRESSION: Mild right-sided hydronephrosis probably related to pregnant uterus. Electronically Signed   By: Mitzi Hansen M.D.   On: 01/31/2016 03:39   US Ob Limited  Result Date: 01/31/2016 CLINICAL DATA:  Pelvic pressure and pain since yesterday. Estimated gestational age by LMP is 25 weeks 0 days. EXAM: LIMITED OBSTETRIC ULTRASOUND FINDINGS: Number of Fetuses: 1 Heart Rate:  137 bpm Movement: Fetal movement is observed. Presentation: Fetus is shown in cephalic  presentation. Placental Location: Placenta is posterior and homogeneous. Previa: No previa.  Placenta is not low lying. Amniotic Fluid (Subjective):  Within normal limits. FL:  5.9cm 24w  1d MATERNAL FINDINGS: Cervix:  Appears closed. Uterus/Adnexae:  Limited visualization.  Ovaries are not identified. IMPRESSION: Single intrauterine pregnancy in cephalic presentation. No acute complication is identified on limited imaging. This exam is performed on an emergent basis and does not comprehensively evaluate fetal size, dating, or anatomy; follow-up complete OB US should be considered if further fetal assessment is warranted. Electronically Signed   By: Burman NievesWilliam  Stevens M.D.   On: 01/31/2016 03:25    Procedures Procedures (including critical care time)  Medications Ordered in ED Medications  acetaminophen (TYLENOL) tablet 1,000 mg (1,000 mg Oral Given 01/31/16 0435)     Initial Impression / Assessment and Plan / ED Course  I have reviewed the triage vital signs and the nursing notes.  Pertinent labs & imaging results that were available during my care of the patient were reviewed by me and considered in my medical decision making (see chart for details).  Clinical Course     20 year old  female, currently [redacted] weeks pregnant, presents for abdominal pain and pregnancy. She has had no vaginal discharge or bleeding. Tenderness is fairly diffuse, present in the upper and lower abdomen. Patient has not taken any medication for pain. [facility] and found to have a reassuring fetal heart rate.  Laboratory workup is reassuring. Patient is afebrile and without leukocytosis. Liver and kidney function preserved. No evidence of urinary tract infection. Ultrasound of the pelvis and abdomen are, too, unremarkable. No evidence of acute process. Specifically, no cholecystitis.   On reassessment, patient is asking for food. Repeat abdominal exam is improved. There is no tenderness to palpation on repeat exam. Abdomen remains soft. Low suspicion for emergent etiology. I have advised the patient to follow-up with her OBGYN or to [facility] new or concerning symptoms. Return precautions given at discharge. Patient discharged in stable condition with no unaddressed concerns.   Final Clinical Impressions(s) / ED Diagnoses   Final diagnoses:  Abdominal pain during pregnancy in second trimester    New Prescriptions Discharge Medication List as of 01/31/2016  4:21 AM    START taking these medications   Details  acetaminophen (TYLENOL) 500 MG tablet Take 1 tablet (500 mg total) by mouth every 6 (six) hours as needed., Starting Mon 01/31/2016, Print         FarwellKelly Matas Burrows, PA-C 01/31/16 0449    Layla MawKristen N Ward, DO 01/31/16 [id number]

## 2016-01-31 NOTE — Discharge Instructions (Signed)
Take tylenol for pain as needed. Follow up with your OBGYN.

## 2016-01-31 NOTE — ED Notes (Signed)
PT states she feels hot and is seeing black dots intermittently.  States symptoms just started while sitting in triage room.

## 2016-01-31 NOTE — ED Notes (Signed)
ED Provider at bedside. 

## 2016-02-01 LAB — URINE CULTURE

## 2016-02-07 ENCOUNTER — Telehealth: Payer: Self-pay | Admitting: *Deleted

## 2016-02-07 NOTE — Telephone Encounter (Signed)
Pt c/o whitish discharge no odor with cramping, +FM, no vaginal bleeding, no gush of fluids. Pt informed to push water, take Tylenol, and lay on left side to help with cramping, can be common to have whitish discharge in pregnancy but should not have odor, color, or irritation. If no improvement with [facility]. Pt verbalized understanding. Cyril MourningJennifer Griffin, NP agreed with recommendation.

## 2016-02-11 ENCOUNTER — Telehealth: Payer: Self-pay | Admitting: *Deleted

## 2016-02-11 NOTE — Telephone Encounter (Signed)
Pt c/o cramping and lower abdominal pressure, been pushing lots of water with no improvement.  Per Joellyn HaffKim Booker, CNM [facility]. Pt verbalized understanding.

## 2016-02-16 ENCOUNTER — Other Ambulatory Visit: Payer: Medicaid Other

## 2016-02-16 ENCOUNTER — Ambulatory Visit (INDEPENDENT_AMBULATORY_CARE_PROVIDER_SITE_OTHER): Payer: Medicaid Other | Admitting: Advanced Practice Midwife

## 2016-02-16 VITALS — BP 120/84 | HR 80 | Wt 191.5 lb

## 2016-02-16 DIAGNOSIS — Z1389 Encounter for screening for other disorder: Secondary | ICD-10-CM

## 2016-02-16 DIAGNOSIS — Z3402 Encounter for supervision of normal first pregnancy, second trimester: Secondary | ICD-10-CM

## 2016-02-16 DIAGNOSIS — Z131 Encounter for screening for diabetes mellitus: Secondary | ICD-10-CM

## 2016-02-16 DIAGNOSIS — Z331 Pregnant state, incidental: Secondary | ICD-10-CM

## 2016-02-16 DIAGNOSIS — Z3482 Encounter for supervision of other normal pregnancy, second trimester: Secondary | ICD-10-CM

## 2016-02-16 DIAGNOSIS — O210 Mild hyperemesis gravidarum: Secondary | ICD-10-CM

## 2016-02-16 DIAGNOSIS — Z3403 Encounter for supervision of normal first pregnancy, third trimester: Secondary | ICD-10-CM

## 2016-02-16 DIAGNOSIS — Z3A28 28 weeks gestation of pregnancy: Secondary | ICD-10-CM

## 2016-02-16 LAB — POCT URINALYSIS DIPSTICK
Blood, UA: NEGATIVE
Glucose, UA: NEGATIVE
Leukocytes, UA: NEGATIVE
Nitrite, UA: NEGATIVE

## 2016-02-16 NOTE — Patient Instructions (Signed)
Third Trimester of Pregnancy The third trimester is from week 29 through week 40 (months 7 through 9). The third trimester is a time when the unborn baby (fetus) is growing rapidly. At the end of the ninth month, the fetus is about 20 inches in length and weighs 6-10 pounds. Body changes during your third trimester Your body goes through many changes during pregnancy. The changes vary from woman to woman. During the third trimester:  Your weight will continue to increase. You can expect to gain 25-35 pounds (11-16 kg) by the end of the pregnancy.  You may begin to get stretch marks on your hips, abdomen, and breasts.  You may urinate more often because the fetus is moving lower into your pelvis and pressing on your bladder.  You may develop or continue to have heartburn. This is caused by increased hormones that slow down muscles in the digestive tract.  You may develop or continue to have constipation because increased hormones slow digestion and cause the muscles that push waste through your intestines to relax.  You may develop hemorrhoids. These are swollen veins (varicose veins) in the rectum that can itch or be painful.  You may develop swollen, bulging veins (varicose veins) in your legs.  You may have increased body aches in the pelvis, back, or thighs. This is due to weight gain and increased hormones that are relaxing your joints.  You may have changes in your hair. These can include thickening of your hair, rapid growth, and changes in texture. Some women also have hair loss during or after pregnancy, or hair that feels dry or thin. Your hair will most likely return to normal after your baby is born.  Your breasts will continue to grow and they will continue to become tender. A yellow fluid (colostrum) may leak from your breasts. This is the first milk you are producing for your baby.  Your belly button may stick out.  You may notice more swelling in your hands, face, or  ankles.  You may have increased tingling or numbness in your hands, arms, and legs. The skin on your belly may also feel numb.  You may feel short of breath because of your expanding uterus.  You may have more problems sleeping. This can be caused by the size of your belly, increased need to urinate, and an increase in your body's metabolism.  You may notice the fetus "dropping," or moving lower in your abdomen.  You may have increased vaginal discharge.  Your cervix becomes thin and soft (effaced) near your due date. What to expect at prenatal visits You will have prenatal exams every 2 weeks until week 36. Then you will have weekly prenatal exams. During a routine prenatal visit:  You will be weighed to make sure you and the fetus are growing normally.  Your blood pressure will be taken.  Your abdomen will be measured to track your baby's growth.  The fetal heartbeat will be listened to.  Any test results from the previous visit will be discussed.  You may have a cervical check near your due date to see if you have effaced. At around 36 weeks, your health care provider will check your cervix. At the same time, your health care provider will also perform a test on the secretions of the vaginal tissue. This test is to determine if a type of bacteria, Group B streptococcus, is present. Your health care provider will explain this further. Your health care provider may ask you:    What your birth plan is.  How you are feeling.  If you are feeling the baby move.  If you have had any abnormal symptoms, such as leaking fluid, bleeding, severe headaches, or abdominal cramping.  If you are using any tobacco products, including cigarettes, chewing tobacco, and electronic cigarettes.  If you have any questions. Other tests or screenings that may be performed during your third trimester include:  Blood tests that check for low iron levels (anemia).  Fetal testing to check the health,  activity level, and growth of the fetus. Testing is done if you have certain medical conditions or if there are problems during the pregnancy.  Nonstress test (NST). This test checks the health of your baby to make sure there are no signs of problems, such as the baby not getting enough oxygen. During this test, a belt is placed around your belly. The baby is made to move, and its heart rate is monitored during movement. What is false labor? False labor is a condition in which you feel small, irregular tightenings of the muscles in the womb (contractions) that eventually go away. These are called Braxton Hicks contractions. Contractions may last for hours, days, or even weeks before true labor sets in. If contractions come at regular intervals, become more frequent, increase in intensity, or become painful, you should see your health care provider. What are the signs of labor?  Abdominal cramps.  Regular contractions that start at 10 minutes apart and become stronger and more frequent with time.  Contractions that start on the top of the uterus and spread down to the lower abdomen and back.  Increased pelvic pressure and dull back pain.  A watery or bloody mucus discharge that comes from the vagina.  Leaking of amniotic fluid. This is also known as your "water breaking." It could be a slow trickle or a gush. Let your doctor know if it has a color or strange odor. If you have any of these signs, call your health care provider right away, even if it is before your due date. Follow these instructions at home: Eating and drinking  Continue to eat regular, healthy meals.  Do not eat:  Raw meat or meat spreads.  Unpasteurized milk or cheese.  Unpasteurized juice.  Store-made salad.  Refrigerated smoked seafood.  Hot dogs or deli meat, unless they are piping hot.  More than 6 ounces of albacore tuna a week.  Shark, swordfish, king mackerel, or tile fish.  Store-made salads.  Raw  sprouts, such as mung bean or alfalfa sprouts.  Take prenatal vitamins as told by your health care provider.  Take 1000 mg of calcium daily as told by your health care provider.  If you develop constipation:  Take over-the-counter or prescription medicines.  Drink enough fluid to keep your urine clear or pale yellow.  Eat foods that are high in fiber, such as fresh fruits and vegetables, whole grains, and beans.  Limit foods that are high in fat and processed sugars, such as fried and sweet foods. Activity  Exercise only as directed by your health care provider. Healthy pregnant women should aim for 2 hours and 30 minutes of moderate exercise per week. If you experience any pain or discomfort while exercising, stop.  Avoid heavy lifting.  Do not exercise in extreme heat or humidity, or at high altitudes.  Wear low-heel, comfortable shoes.  Practice good posture.  Do not travel far distances unless it is absolutely necessary and only with the approval   of your health care provider.  Wear your seat belt at all times while in a car, on a bus, or on a plane.  Take frequent breaks and rest with your legs elevated if you have leg cramps or low back pain.  Do not use hot tubs, steam rooms, or saunas.  You may continue to have sex unless your health care provider tells you otherwise. Lifestyle  Do not use any products that contain nicotine or tobacco, such as cigarettes and e-cigarettes. If you need help quitting, ask your health care provider.  Do not drink alcohol.  Do not use any medicinal herbs or unprescribed drugs. These chemicals affect the formation and growth of the baby.  If you develop varicose veins:  Wear support pantyhose or compression stockings as told by your healthcare provider.  Elevate your feet for 15 minutes, 3-4 times a day.  Wear a supportive maternity bra to help with breast tenderness. General instructions  Take over-the-counter and prescription  medicines only as told by your health care provider. There are medicines that are either safe or unsafe to take during pregnancy.  Take warm sitz baths to soothe any pain or discomfort caused by hemorrhoids. Use hemorrhoid cream or witch hazel if your health care provider approves.  Avoid cat litter boxes and soil used by cats. These carry germs that can cause birth defects in the baby. If you have a cat, ask someone to clean the litter box for you.  To prepare for the arrival of your baby:  Take prenatal classes to understand, practice, and ask questions about the labor and delivery.  Make a trial run to the hospital.  Visit the hospital and tour the maternity area.  Arrange for maternity or paternity leave through employers.  Arrange for family and friends to take care of pets while you are in the hospital.  Purchase a rear-facing car seat and make sure you know how to install it in your car.  Pack your hospital bag.  Prepare the baby's nursery. Make sure to remove all pillows and stuffed animals from the baby's crib to prevent suffocation.  Visit your dentist if you have not gone during your pregnancy. Use a soft toothbrush to brush your teeth and be gentle when you floss.  Keep all prenatal follow-up visits as told by your health care provider. This is important. Contact a health care provider if:  You are unsure if you are in labor or if your water has broken.  You become dizzy.  You have mild pelvic cramps, pelvic pressure, or nagging pain in your abdominal area.  You have lower back pain.  You have persistent nausea, vomiting, or diarrhea.  You have an unusual or bad smelling vaginal discharge.  You have pain when you urinate. Get help right away if:  You have a fever.  You are leaking fluid from your vagina.  You have spotting or bleeding from your vagina.  You have severe abdominal pain or cramping.  You have rapid weight loss or weight gain.  You have  shortness of breath with chest pain.  You notice sudden or extreme swelling of your face, hands, ankles, feet, or legs.  Your baby makes fewer than 10 movements in 2 hours.  You have severe headaches that do not go away with medicine.  You have vision changes. Summary  The third trimester is from week 29 through week 40, months 7 through 9. The third trimester is a time when the unborn baby (fetus)   is growing rapidly.  During the third trimester, your discomfort may increase as you and your baby continue to gain weight. You may have abdominal, leg, and back pain, sleeping problems, and an increased need to urinate.  During the third trimester your breasts will keep growing and they will continue to become tender. A yellow fluid (colostrum) may leak from your breasts. This is the first milk you are producing for your baby.  False labor is a condition in which you feel small, irregular tightenings of the muscles in the womb (contractions) that eventually go away. These are called Braxton Hicks contractions. Contractions may last for hours, days, or even weeks before true labor sets in.  Signs of labor can include: abdominal cramps; regular contractions that start at 10 minutes apart and become stronger and more frequent with time; watery or bloody mucus discharge that comes from the vagina; increased pelvic pressure and dull back pain; and leaking of amniotic fluid. This information is not intended to replace advice given to you by your health care provider. Make sure you discuss any questions you have with your health care provider. Document Released: 02/14/2001 Document Revised: 07/29/2015 Document Reviewed: 04/23/2012 Elsevier Interactive Patient Education  2017 [employer]  

## 2016-02-16 NOTE — Progress Notes (Signed)
G1P0 1847w2d Estimated Date of Delivery: 05/15/16  Blood pressure 120/84, pulse 80, weight 191 lb 8 oz (86.9 kg), last menstrual period 08/09/2015.   BP weight and urine results all reviewed and noted.  Please refer to the obstetrical flow sheet for the fundal height and fetal heart rate documentation:  Patient reports good fetal movement, denies any bleeding and no rupture of membranes symptoms or regular contractions. Patient stopped Diclegis 2 days ago, and started vomiting again.  Plans to restart it.  Has zofran Prn All questions were answered.  Orders Placed This Encounter  Procedures  . POCT urinalysis dipstick    Plan:  Continued routine obstetrical care, PN2 (vomited it 2minutes afterwards)  Return in about 3 weeks (around 03/08/2016) for LROB and Friday for PN2 only.

## 2016-02-17 LAB — CBC
Hematocrit: 37.9 % (ref 34.0–46.6)
Hemoglobin: 12.4 g/dL (ref 11.1–15.9)
MCH: 28.2 pg (ref 26.6–33.0)
MCHC: 32.7 g/dL (ref 31.5–35.7)
MCV: 86 fL (ref 79–97)
Platelets: 244 10*3/uL (ref 150–379)
RBC: 4.4 x10E6/uL (ref 3.77–5.28)
RDW: 13.4 % (ref 12.3–15.4)
WBC: 10.5 10*3/uL (ref 3.4–10.8)

## 2016-02-17 LAB — RPR: RPR Ser Ql: NONREACTIVE

## 2016-02-17 LAB — ANTIBODY SCREEN: Antibody Screen: NEGATIVE

## 2016-02-17 LAB — HIV ANTIBODY (ROUTINE TESTING W REFLEX): HIV Screen 4th Generation wRfx: NONREACTIVE

## 2016-02-18 ENCOUNTER — Other Ambulatory Visit: Payer: Medicaid Other

## 2016-02-18 DIAGNOSIS — Z131 Encounter for screening for diabetes mellitus: Secondary | ICD-10-CM

## 2016-02-18 DIAGNOSIS — Z3402 Encounter for supervision of normal first pregnancy, second trimester: Secondary | ICD-10-CM

## 2016-02-19 LAB — GLUCOSE TOLERANCE, 2 HOURS W/ 1HR
Glucose, 1 hour: 153 mg/dL (ref 65–179)
Glucose, 2 hour: 114 mg/dL (ref 65–152)
Glucose, Fasting: 77 mg/dL (ref 65–91)

## 2016-02-23 ENCOUNTER — Encounter: Payer: Self-pay | Admitting: Adult Health

## 2016-03-06 NOTE — L&D Delivery Note (Signed)
Delivery Note At 1:38 PM a viable female was delivered via Vaginal, Spontaneous Delivery (Presentation: vertex; ROA).  APGAR: 9, 9; weight pending .   Placenta status:spontaneously delivered , intact.  Cord: 3 vessels with no complications.  Cord pH: n/a Cord clamped after 60s Anesthesia:  Epidural + lidocaine (local) Episiotomy:  no Lacerations: Vaginal;Periurethral;Labial Suture Repair: 3.0 vicryl for vaginal repair, 4.0 monocryl for right labial and left periurethral laceration Est. Blood Loss (mL): 200  Mom to postpartum.  Baby to Couplet care / Skin to Skin.  Samantha Sandoval 05/16/2016, 2:19 PM   OB FELLOW DELIVERY ATTESTATION  I was gloved and present for the delivery in its entirety, and I agree with the above resident's note.    Jen MowElizabeth Maurico Perrell, DO OB Fellow 4:17 PM

## 2016-03-09 ENCOUNTER — Ambulatory Visit (INDEPENDENT_AMBULATORY_CARE_PROVIDER_SITE_OTHER): Payer: Medicaid Other | Admitting: Obstetrics and Gynecology

## 2016-03-09 ENCOUNTER — Encounter: Payer: Self-pay | Admitting: Obstetrics and Gynecology

## 2016-03-09 ENCOUNTER — Other Ambulatory Visit: Payer: Medicaid Other

## 2016-03-09 VITALS — BP 123/68 | HR 90 | Wt 193.4 lb

## 2016-03-09 DIAGNOSIS — Z1389 Encounter for screening for other disorder: Secondary | ICD-10-CM

## 2016-03-09 DIAGNOSIS — Z3403 Encounter for supervision of normal first pregnancy, third trimester: Secondary | ICD-10-CM

## 2016-03-09 DIAGNOSIS — Z331 Pregnant state, incidental: Secondary | ICD-10-CM

## 2016-03-09 DIAGNOSIS — Z3A30 30 weeks gestation of pregnancy: Secondary | ICD-10-CM

## 2016-03-09 LAB — POCT URINALYSIS DIPSTICK
Blood, UA: NEGATIVE
Glucose, UA: NEGATIVE
Ketones, UA: NEGATIVE
Leukocytes, UA: NEGATIVE
Nitrite, UA: NEGATIVE
Protein, UA: NEGATIVE

## 2016-03-09 NOTE — Progress Notes (Signed)
G1P0  Estimated Date of Delivery: 05/15/16 LROB 7328w3d  Blood pressure 123/68, pulse 90, weight 193 lb 6.4 oz (87.7 kg), last menstrual period 08/09/2015.   Urine results:notable for neg protein and gluc.  Chief Complaint  Patient presents with  . Routine Prenatal Visit    Patient complaints:none, wants u/s , referred to [facility] . Patient reports   good fetal movement,                           denies any bleeding , rupture of membranes,or regular contractions.   refer to the ob flow sheet for FH and FHR, ,         Fhr 126,                  Physical Examination: General appearance - alert, well appearing, and in no distress                                      Abdomen - FH 33                                                         -FHR 126                                                         soft, nontender, nondistended, no masses or organomegaly                                      Pelvic -                                             Questions were answered. Given baby booklet, referred to childbirth classes Assessment: LROB G1P0 @ 428w3d , stable  Plan:  Continued routine obstetrical care, classes encouraged.  F/u in 2 weeks for lrob

## 2016-03-09 NOTE — Patient Instructions (Signed)
(  [phone] is the phone number for Pregnancy Classes or hospital tours at [facility].  ° °You will be referred to  [email or web address]   for more information on childbirth classes   °At this site you may register for classes. You may sign up for a waiting list if classes are full. Please SIGN UP FOR THIS!.   When the waiting list becomes long, sometimes new classes can be added. ° ° ° °

## 2016-03-23 ENCOUNTER — Encounter: Payer: Medicaid Other | Admitting: Advanced Practice Midwife

## 2016-03-27 ENCOUNTER — Encounter: Payer: Self-pay | Admitting: Women's Health

## 2016-03-27 ENCOUNTER — Ambulatory Visit (INDEPENDENT_AMBULATORY_CARE_PROVIDER_SITE_OTHER): Payer: Medicaid Other | Admitting: Women's Health

## 2016-03-27 VITALS — BP 112/59 | HR 76 | Wt 197.0 lb

## 2016-03-27 DIAGNOSIS — Z3403 Encounter for supervision of normal first pregnancy, third trimester: Secondary | ICD-10-CM

## 2016-03-27 DIAGNOSIS — Z331 Pregnant state, incidental: Secondary | ICD-10-CM

## 2016-03-27 DIAGNOSIS — Z3A33 33 weeks gestation of pregnancy: Secondary | ICD-10-CM

## 2016-03-27 DIAGNOSIS — Z1389 Encounter for screening for other disorder: Secondary | ICD-10-CM

## 2016-03-27 LAB — POCT URINALYSIS DIPSTICK
Blood, UA: NEGATIVE
Glucose, UA: NEGATIVE
Ketones, UA: NEGATIVE
Nitrite, UA: NEGATIVE
Protein, UA: NEGATIVE

## 2016-03-27 NOTE — Patient Instructions (Addendum)
Call the office ([phone]) or go to [facility] if:  You begin to have strong, frequent contractions  Your water breaks.  Sometimes it is a big gush of fluid, sometimes it is just a trickle that keeps getting your panties wet or running down your legs  You have vaginal bleeding.  It is normal to have a small amount of spotting if your cervix was checked.   You don't feel your baby moving like normal.  If you don't, get you something to eat and drink and lay down and focus on feeling your baby move.  You should feel at least 10 movements in 2 hours.  If you don't, you should call the office or go to [facility].   Tdap Vaccine  It is recommended that you get the Tdap vaccine during the third trimester of EACH pregnancy to help protect your baby from getting pertussis (whooping cough)  27-36 weeks is the BEST time to do this so that you can pass the protection on to your baby. During pregnancy is better than after pregnancy, but if you are unable to get it during pregnancy it will be offered at the hospital.   You can get this vaccine at the health department or your family doctor  Everyone who will be around your baby should also be up-to-date on their vaccines. Adults (who are not pregnant) only need 1 dose of Tdap during adulthood.       [place] Pediatricians/Family Doctors:  [facility] [phone]            [facility] [phone]                 [facility] [phone] (usually not accepting new patients unless you have family there already, you are always welcome to call and ask)            [facility] ([address] [place]) [phone]   [place] Pediatricians/Family Doctors:   [facility]: [phone]  [facility]: [phone]     Preterm Labor and Birth Information The normal length of a pregnancy is 39-41 weeks. Preterm labor is when labor starts before 37  completed weeks of pregnancy. What are the risk factors for preterm labor? Preterm labor is more likely to occur in women who:  Have certain infections during pregnancy such as a bladder infection, sexually transmitted infection, or infection inside the uterus (chorioamnionitis).  Have a shorter-than-normal cervix.  Have gone into preterm labor before.  Have had surgery on their cervix.  Are younger than age 17 or older than age 35.  Are African American.  Are pregnant with twins or multiple babies (multiple gestation).  Take street drugs or smoke while pregnant.  Do not gain enough weight while pregnant.  Became pregnant shortly after having been pregnant. What are the symptoms of preterm labor? Symptoms of preterm labor include:  Cramps similar to those that can happen during a menstrual period. The cramps may happen with diarrhea.  Pain in the abdomen or lower back.  Regular uterine contractions that may feel like tightening of the abdomen.  A feeling of increased pressure in the pelvis.  Increased watery or bloody mucus discharge from the vagina.  Water breaking (ruptured amniotic sac). Why is it important to recognize signs of preterm labor? It is important to recognize signs of preterm labor because babies who are born prematurely may not be fully developed. This can put them at an increased risk for:  Long-term (  lung problems.  Difficulty immediately after birth with regulating body systems, including blood sugar, body temperature, heart rate, and breathing rate.  Bleeding in the brain.  Cerebral palsy.  Learning difficulties.  Death. These risks are highest for babies who are born before 34 weeks of pregnancy. How is preterm labor treated? Treatment depends on the length of your pregnancy, your condition, and the health of your baby. It may involve:  Having a stitch (suture) placed in your cervix to prevent your cervix from opening too early  (cerclage).  Taking or being given medicines, such as:  Hormone medicines. These may be given early in pregnancy to help support the pregnancy.  Medicine to stop contractions.  Medicines to help mature the baby's lungs. These may be prescribed if the risk of delivery is high.  Medicines to prevent your baby from developing cerebral palsy. If the labor happens before 34 weeks of pregnancy, you may need to stay in the hospital. What should I do if I think I am in preterm labor? If you think that you are going into preterm labor, call your health care provider right away. How can I prevent preterm labor in future pregnancies? To increase your chance of having a full-term pregnancy:  Do not use any tobacco products, such as cigarettes, chewing tobacco, and e-cigarettes. If you need help quitting, ask your health care provider.  Do not use street drugs or medicines that have not been prescribed to you during your pregnancy.  Talk with your health care provider before taking any herbal supplements, even if you have been taking them regularly.  Make sure you gain a healthy amount of weight during your pregnancy.  Watch for infection. If you think that you might have an infection, get it checked right away.  Make sure to tell your health care provider if you have gone into preterm labor before. This information is not intended to replace advice given to you by your health care provider. Make sure you discuss any questions you have with your health care provider. Document Released: 05/13/2003 Document Revised: 08/03/2015 Document Reviewed: 07/14/2015 Elsevier Interactive Patient Education  2017 [employer]

## 2016-03-27 NOTE — Progress Notes (Signed)
Low-risk OB appointment G1P0 32107w0d Estimated Date of Delivery: 05/15/16 BP (!) 112/59   Pulse 76   Wt 197 lb (89.4 kg)   LMP 08/09/2015   BMI 36.03 kg/m   BP, weight, and urine reviewed.  Refer to obstetrical flow sheet for FH & FHR.  Reports good fm.  Denies regular uc's, lof, vb, or uti s/s. No complaints. Declined cb classes, recommended tour. POPs for contraception Reviewed ptl s/s, fkc. Recommended Tdap at HD/PCP per CDC guidelines.  Plan:  Continue routine obstetrical care  F/U in 2wks for OB appointment

## 2016-03-28 ENCOUNTER — Encounter: Payer: Self-pay | Admitting: Advanced Practice Midwife

## 2016-03-28 ENCOUNTER — Inpatient Hospital Stay (HOSPITAL_COMMUNITY)
Admission: AD | Admit: 2016-03-28 | Discharge: 2016-03-28 | Disposition: A | Discharge status: Home / Self Care | Payer: Medicaid Other | Source: Ambulatory Visit | Attending: Family Medicine | Admitting: Family Medicine

## 2016-03-28 ENCOUNTER — Encounter: Payer: Self-pay | Admitting: Women's Health

## 2016-03-28 ENCOUNTER — Telehealth: Payer: Self-pay | Admitting: *Deleted

## 2016-03-28 ENCOUNTER — Encounter (HOSPITAL_COMMUNITY): Payer: Self-pay | Admitting: *Deleted

## 2016-03-28 DIAGNOSIS — N760 Acute vaginitis: Secondary | ICD-10-CM | POA: Diagnosis not present

## 2016-03-28 DIAGNOSIS — Z87891 Personal history of nicotine dependence: Secondary | ICD-10-CM | POA: Diagnosis not present

## 2016-03-28 DIAGNOSIS — O23593 Infection of other part of genital tract in pregnancy, third trimester: Secondary | ICD-10-CM | POA: Diagnosis not present

## 2016-03-28 DIAGNOSIS — Z3A33 33 weeks gestation of pregnancy: Secondary | ICD-10-CM

## 2016-03-28 DIAGNOSIS — B9689 Other specified bacterial agents as the cause of diseases classified elsewhere: Secondary | ICD-10-CM | POA: Diagnosis not present

## 2016-03-28 DIAGNOSIS — R109 Unspecified abdominal pain: Secondary | ICD-10-CM | POA: Diagnosis present

## 2016-03-28 LAB — URINALYSIS, ROUTINE W REFLEX MICROSCOPIC
Bilirubin Urine: NEGATIVE
Glucose, UA: NEGATIVE mg/dL
Hgb urine dipstick: NEGATIVE
Ketones, ur: NEGATIVE mg/dL
Nitrite: NEGATIVE
Protein, ur: NEGATIVE mg/dL
Specific Gravity, Urine: 1.005 (ref 1.005–1.030)
pH: 6 (ref 5.0–8.0)

## 2016-03-28 LAB — WET PREP, GENITAL
Sperm: NONE SEEN
Trich, Wet Prep: NONE SEEN
Yeast Wet Prep HPF POC: NONE SEEN

## 2016-03-28 LAB — POCT FERN TEST: POCT Fern Test: NEGATIVE

## 2016-03-28 MED ORDER — METRONIDAZOLE 500 MG PO TABS: 500.0000 mg | ORAL_TABLET | Freq: Two times a day (BID) | ORAL | 0 refills | Status: DC

## 2016-03-28 NOTE — Telephone Encounter (Signed)
Patient called stating she awoke last night and noticed her bed was wet. She has also worn a panty liner today with some leaking today. No bleeding, some mild cramping. I spoke with Genella RifeK. Booker, CNM who stated [facility] Pt verbalized understanding.

## 2016-03-28 NOTE — MAU Provider Note (Signed)
History     CSN: [id number]  Arrival date and time: 03/28/16 1702  First Provider Initiated Contact with Patient 03/28/16 1755      Chief Complaint  Patient presents with  . Rupture of Membranes  . Abdominal Pain   HPI Samantha Sandoval is a 21 y.o. G1P0 at [redacted]w[redacted]d who presents for vaginal discharge and abdominal pain. Reports increase in vaginal discharge since Saturday. Describes as watery discharge that leaves her underwear wet. No odor or irritation. Called office today & was told to come in to r/o SROM. Also reports some lower abdominal pain since this morning. Pain occurs every 20-30 minutes. Rates pain 8/10. Has not treated.  Some nausea today; no vomiting and not currently nauseated. Reports 2 loose stool this morning.  Denies vaginal bleeding or recent intercourse. Positive fetal movement.   OB History    Gravida Para Term Preterm AB Living   1             SAB TAB Ectopic Multiple Live Births                  Past Medical History:  Diagnosis Date  . Anxiety     Past Surgical History:  Procedure Laterality Date  . ABDOMINAL SURGERY    . tubes in ears    . 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 Diabetes Maternal Grandmother   . Hypertension Maternal Grandmother   . COPD Maternal Grandmother   . Scoliosis Maternal Grandmother   . Heart disease Maternal Grandmother   . Hypertension Maternal Grandfather   . Other Maternal Grandfather     2 stents in heart  . Heart attack Paternal Grandfather   . Congestive Heart Failure Neg Hx     Social History  Substance Use Topics  . Smoking status: Former Smoker    Types: Cigarettes  . Smokeless tobacco: Never Used  . Alcohol use No     Comment: none    Allergies: No Known Allergies  Prescriptions Prior to Admission  Medication Sig Dispense Refill Last Dose  . acetaminophen (TYLENOL) 500 MG tablet Take 1 tablet (500 mg total) by mouth every 6 (six) hours as needed. (Patient not  taking: Reported on 03/09/2016) 30 tablet 0 Not Taking  . Doxylamine-Pyridoxine 10-10 MG TBEC 2 PO qhs; may take 1po in am and 1po in afternoon prn nausea 120 tablet 3 Taking  . Prenatal Vit-Fe Fumarate-FA (MULTIVITAMIN-PRENATAL) 27-0.8 MG TABS tablet Take 1 tablet by mouth daily at 12 noon.   Taking    Review of Systems  Constitutional: Negative.   Gastrointestinal: Positive for abdominal pain and nausea. Negative for constipation, diarrhea and vomiting.  Genitourinary: Positive for vaginal discharge. Negative for vaginal bleeding.   Physical Exam   Blood pressure 121/65, pulse 92, temperature 98.6 F (37 C), temperature source Oral, resp. rate 16, weight 197 lb 12 oz (89.7 kg), last menstrual period 08/09/2015.  Physical Exam  Nursing note and vitals reviewed. Constitutional: She is oriented to person, place, and time. She appears well-developed and well-nourished. No distress.  HENT:  Head: Normocephalic and atraumatic.  Eyes: Conjunctivae are normal. Right eye exhibits no discharge. Left eye exhibits no discharge. No scleral icterus.  Neck: Normal range of motion.  Cardiovascular: Normal rate, regular rhythm and normal heart sounds.   No murmur heard. Respiratory: Effort normal and breath sounds normal. No respiratory distress. She has no wheezes.  GI: Soft. There  is no tenderness.  Genitourinary: No bleeding in the vagina. Vaginal discharge (small amount of thin white discharge; no pooling) found.  Neurological: She is alert and oriented to person, place, and time.  Skin: Skin is warm and dry. She is not diaphoretic.  Psychiatric: She has a normal mood and affect. Her behavior is normal. Judgment and thought content normal.     Dilation: Closed Effacement (%): Thick Cervical Position: Posterior Station: -3 Exam by:: Judeth HornErin Utuado Antolin NP  Fetal Tracing:  Baseline: 140 Variability: moderate Accelerations: 15x15 Decelerations: none  Toco: x 1   MAU Course   Procedures Results for orders placed or performed during the hospital encounter of 03/28/16 (from the past 24 hour(s))  Urinalysis, Routine w reflex microscopic     Status: Abnormal   Collection Time: 03/28/16  5:38 PM  Result Value Ref Range   Color, Urine YELLOW YELLOW   APPearance CLEAR CLEAR   Specific Gravity, Urine 1.005 1.005 - 1.030   pH 6.0 5.0 - 8.0   Glucose, UA NEGATIVE NEGATIVE mg/dL   Hgb urine dipstick NEGATIVE NEGATIVE   Bilirubin Urine NEGATIVE NEGATIVE   Ketones, ur NEGATIVE NEGATIVE mg/dL   Protein, ur NEGATIVE NEGATIVE mg/dL   Nitrite NEGATIVE NEGATIVE   Leukocytes, UA TRACE (A) NEGATIVE   RBC / HPF 0-5 0 - 5 RBC/hpf   WBC, UA 0-5 0 - 5 WBC/hpf   Bacteria, UA MANY (A) NONE SEEN   Squamous Epithelial / LPF 0-5 (A) NONE SEEN  Wet prep, genital     Status: Abnormal   Collection Time: 03/28/16  6:13 PM  Result Value Ref Range   Yeast Wet Prep HPF POC NONE SEEN NONE SEEN   Trich, Wet Prep NONE SEEN NONE SEEN   Clue Cells Wet Prep HPF POC PRESENT (A) NONE SEEN   WBC, Wet Prep HPF POC FEW (A) NONE SEEN   Sperm NONE SEEN   Fern Test     Status: None   Collection Time: 03/28/16  6:39 PM  Result Value Ref Range   POCT Fern Test Negative = intact amniotic membranes     MDM Category 1 tracing; ctx x1 Cervix closed No pooling, fern negative Wet prep + clue cells Assessment and Plan  A: 1. BV (bacterial vaginosis)    P: Discharge home Rx flagyl Preterm labor precautions Discussed reasons to return to MAU Keep f/u with OB  Judeth HornErin Prathik Aman 03/28/2016, 5:54 PM

## 2016-03-28 NOTE — Discharge Instructions (Signed)
Bacterial Vaginosis °Bacterial vaginosis is a vaginal infection that occurs when the normal balance of bacteria in the vagina is disrupted. It results from an overgrowth of certain bacteria. This is the most common vaginal infection among women ages 15-44. °Because bacterial vaginosis increases your risk for STIs (sexually transmitted infections), getting treated can help reduce your risk for chlamydia, gonorrhea, herpes, and HIV (human immunodeficiency virus). Treatment is also important for preventing complications in pregnant women, because this condition can cause an early (premature) delivery. °What are the causes? °This condition is caused by an increase in harmful bacteria that are normally present in small amounts in the vagina. However, the reason that the condition develops is not fully understood. °What increases the risk? °The following factors may make you more likely to develop this condition: °· Having a new sexual partner or multiple sexual partners. °· Having unprotected sex. °· Douching. °· Having an intrauterine device (IUD). °· Smoking. °· Drug and alcohol abuse. °· Taking certain antibiotic medicines. °· Being pregnant. °You cannot get bacterial vaginosis from toilet seats, bedding, swimming pools, or contact with objects around you. °What are the signs or symptoms? °Symptoms of this condition include: °· Grey or white vaginal discharge. The discharge can also be watery or foamy. °· A fish-like odor with discharge, especially after sexual intercourse or during menstruation. °· Itching in and around the vagina. °· Burning or pain with urination. °Some women with bacterial vaginosis have no signs or symptoms. °How is this diagnosed? °This condition is diagnosed based on: °· Your medical history. °· A physical exam of the vagina. °· Testing a sample of vaginal fluid under a microscope to look for a large amount of bad bacteria or abnormal cells. Your health care provider may use a cotton swab or a  small wooden spatula to collect the sample. °How is this treated? °This condition is treated with antibiotics. These may be given as a pill, a vaginal cream, or a medicine that is put into the vagina (suppository). If the condition comes back after treatment, a second round of antibiotics may be needed. °Follow these instructions at home: °Medicines  °· Take over-the-counter and prescription medicines only as told by your health care provider. °· Take or use your antibiotic as told by your health care provider. Do not stop taking or using the antibiotic even if you start to feel better. °General instructions  °· If you have a female sexual partner, tell her that you have a vaginal infection. She should see her health care provider and be treated if she has symptoms. If you have a female sexual partner, he does not need treatment. °· During treatment: °¨ Avoid sexual activity until you finish treatment. °¨ Do not douche. °¨ Avoid alcohol as directed by your health care provider. °¨ Avoid breastfeeding as directed by your health care provider. °· Drink enough water and fluids to keep your urine clear or pale yellow. °· Keep the area around your vagina and rectum clean. °¨ Wash the area daily with warm water. °¨ Wipe yourself from front to back after using the toilet. °· Keep all follow-up visits as told by your health care provider. This is important. °How is this prevented? °· Do not douche. °· Wash the outside of your vagina with warm water only. °· Use protection when having sex. This includes latex condoms and dental dams. °· Limit how many sexual partners you have. To help prevent bacterial vaginosis, it is best to have sex with just one   partner (monogamous). °· Make sure you and your sexual partner are tested for STIs. °· Wear cotton or cotton-lined underwear. °· Avoid wearing tight pants and pantyhose, especially during summer. °· Limit the amount of alcohol that you drink. °· Do not use any products that contain  nicotine or tobacco, such as cigarettes and e-cigarettes. If you need help quitting, ask your health care provider. °· Do not use illegal drugs. °Where to find more information: °· Centers for Disease Control and Prevention: www.cdc.gov/std °· American Sexual Health Association (ASHA): www.ashastd.org °· U.S. Department of Health and Human Services, Office on Women's Health: www.womenshealth.gov/ or https://www.womenshealth.gov/a-z-topics/bacterial-vaginosis °Contact a health care provider if: °· Your symptoms do not improve, even after treatment. °· You have more discharge or pain when urinating. °· You have a fever. °· You have pain in your abdomen. °· You have pain during sex. °· You have vaginal bleeding between periods. °Summary °· Bacterial vaginosis is a vaginal infection that occurs when the normal balance of bacteria in the vagina is disrupted. °· Because bacterial vaginosis increases your risk for STIs (sexually transmitted infections), getting treated can help reduce your risk for chlamydia, gonorrhea, herpes, and HIV (human immunodeficiency virus). Treatment is also important for preventing complications in pregnant women, because the condition can cause an early (premature) delivery. °· This condition is treated with antibiotic medicines. These may be given as a pill, a vaginal cream, or a medicine that is put into the vagina (suppository). °This information is not intended to replace advice given to you by your health care provider. Make sure you discuss any questions you have with your health care provider. °Document Released: 02/20/2005 Document Revised: 11/06/2015 Document Reviewed: 11/06/2015 °Elsevier Interactive Patient Education © 2017 [employer]vier Inc. °Preterm Labor and Birth Information °The normal length of a pregnancy is 39-41 weeks. Preterm labor is when labor starts before 37 completed weeks of pregnancy. °What are the risk factors for preterm labor? °Preterm labor is more likely to occur in  women who: °· Have certain infections during pregnancy such as a bladder infection, sexually transmitted infection, or infection inside the uterus (chorioamnionitis). °· Have a shorter-than-normal cervix. °· Have gone into preterm labor before. °· Have had surgery on their cervix. °· Are younger than age 17 or older than age 35. °· Are African American. °· Are pregnant with twins or multiple babies (multiple gestation). °· Take street drugs or smoke while pregnant. °· Do not gain enough weight while pregnant. °· Became pregnant shortly after having been pregnant. °What are the symptoms of preterm labor? °Symptoms of preterm labor include: °· Cramps similar to those that can happen during a menstrual period. The cramps may happen with diarrhea. °· Pain in the abdomen or lower back. °· Regular uterine contractions that may feel like tightening of the abdomen. °· A feeling of increased pressure in the pelvis. °· Increased watery or bloody mucus discharge from the vagina. °· Water breaking (ruptured amniotic sac). °Why is it important to recognize signs of preterm labor? °It is important to recognize signs of preterm labor because babies who are born prematurely may not be fully developed. This can put them at an increased risk for: °· Long-term (chronic) heart and lung problems. °· Difficulty immediately after birth with regulating body systems, including blood sugar, body temperature, heart rate, and breathing rate. °· Bleeding in the brain. °· Cerebral palsy. °· Learning difficulties. °· Death. °These risks are highest for babies who are born before 34 weeks of pregnancy. °How is   preterm labor treated? °Treatment depends on the length of your pregnancy, your condition, and the health of your baby. It may involve: °· Having a stitch (suture) placed in your cervix to prevent your cervix from opening too early (cerclage). °· Taking or being given medicines, such as: °¨ Hormone medicines. These may be given early in  pregnancy to help support the pregnancy. °¨ Medicine to stop contractions. °¨ Medicines to help mature the baby’s lungs. These may be prescribed if the risk of delivery is high. °¨ Medicines to prevent your baby from developing cerebral palsy. °If the labor happens before 34 weeks of pregnancy, you may need to stay in the hospital. °What should I do if I think I am in preterm labor? °If you think that you are going into preterm labor, call your health care provider right away. °How can I prevent preterm labor in future pregnancies? °To increase your chance of having a full-term pregnancy: °· Do not use any tobacco products, such as cigarettes, chewing tobacco, and e-cigarettes. If you need help quitting, ask your health care provider. °· Do not use street drugs or medicines that have not been prescribed to you during your pregnancy. °· Talk with your health care provider before taking any herbal supplements, even if you have been taking them regularly. °· Make sure you gain a healthy amount of weight during your pregnancy. °· Watch for infection. If you think that you might have an infection, get it checked right away. °· Make sure to tell your health care provider if you have gone into preterm labor before. °This information is not intended to replace advice given to you by your health care provider. Make sure you discuss any questions you have with your health care provider. °Document Released: 05/13/2003 Document Revised: 08/03/2015 Document Reviewed: 07/14/2015 °Elsevier Interactive Patient Education © 2017 Elsevier Inc. ° °

## 2016-03-28 NOTE — MAU Note (Signed)
Has had diarrhea for 2 days (loose), pain in lower back, cramping and pressure in lower abd. Passed ? Mucous plug on Saturday, since then panties have been wet.d/c is watery and clear

## 2016-04-10 ENCOUNTER — Ambulatory Visit (INDEPENDENT_AMBULATORY_CARE_PROVIDER_SITE_OTHER): Payer: Medicaid Other | Admitting: Women's Health

## 2016-04-10 ENCOUNTER — Encounter: Payer: Self-pay | Admitting: Women's Health

## 2016-04-10 VITALS — BP 110/58 | HR 80 | Wt 203.0 lb

## 2016-04-10 DIAGNOSIS — Z1389 Encounter for screening for other disorder: Secondary | ICD-10-CM

## 2016-04-10 DIAGNOSIS — Z331 Pregnant state, incidental: Secondary | ICD-10-CM

## 2016-04-10 DIAGNOSIS — Z3403 Encounter for supervision of normal first pregnancy, third trimester: Secondary | ICD-10-CM

## 2016-04-10 DIAGNOSIS — Z3A35 35 weeks gestation of pregnancy: Secondary | ICD-10-CM

## 2016-04-10 LAB — POCT URINALYSIS DIPSTICK
Blood, UA: NEGATIVE
Glucose, UA: NEGATIVE
Ketones, UA: NEGATIVE
Leukocytes, UA: NEGATIVE
Nitrite, UA: NEGATIVE

## 2016-04-10 NOTE — Progress Notes (Signed)
Low-risk OB appointment G1P0 4748w0d Estimated Date of Delivery: 05/15/16 BP (!) 110/58   Pulse 80   Wt 203 lb (92.1 kg)   LMP 08/09/2015   BMI 37.13 kg/m   BP, weight, and urine reviewed.  Refer to obstetrical flow sheet for FH & FHR.  Reports good fm.  Denies regular uc's, lof, vb, or uti s/s. Occ slightly bloody watery d/c from bilateral breasts x ~1wk at night when she gets in bathtub. 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 Plan:  Continue routine obstetrical care  F/U in 2wks for OB appointment and gbs

## 2016-04-10 NOTE — Patient Instructions (Signed)
Call the office ([phone]) or go to [facility] if:  You begin to have strong, frequent contractions  Your water breaks.  Sometimes it is a big gush of fluid, sometimes it is just a trickle that keeps getting your panties wet or running down your legs  You have vaginal bleeding.  It is normal to have a small amount of spotting if your cervix was checked.   You don't feel your baby moving like normal.  If you don't, get you something to eat and drink and lay down and focus on feeling your baby move.  You should feel at least 10 movements in 2 hours.  If you don't, you should call the office or go to [facility].     Preterm Labor and Birth Information The normal length of a pregnancy is 39-41 weeks. Preterm labor is when labor starts before 37 completed weeks of pregnancy. What are the risk factors for preterm labor? Preterm labor is more likely to occur in women who:  Have certain infections during pregnancy such as a bladder infection, sexually transmitted infection, or infection inside the uterus (chorioamnionitis).  Have a shorter-than-normal cervix.  Have gone into preterm labor before.  Have had surgery on their cervix.  Are younger than age 17 or older than age 35.  Are African American.  Are pregnant with twins or multiple babies (multiple gestation).  Take street drugs or smoke while pregnant.  Do not gain enough weight while pregnant.  Became pregnant shortly after having been pregnant. What are the symptoms of preterm labor? Symptoms of preterm labor include:  Cramps similar to those that can happen during a menstrual period. The cramps may happen with diarrhea.  Pain in the abdomen or lower back.  Regular uterine contractions that may feel like tightening of the abdomen.  A feeling of increased pressure in the pelvis.  Increased watery or bloody mucus discharge from the vagina.  Water breaking (ruptured amniotic sac). Why is it important to  recognize signs of preterm labor? It is important to recognize signs of preterm labor because babies who are born prematurely may not be fully developed. This can put them at an increased risk for:  Long-term (chronic) heart and lung problems.  Difficulty immediately after birth with regulating body systems, including blood sugar, body temperature, heart rate, and breathing rate.  Bleeding in the brain.  Cerebral palsy.  Learning difficulties.  Death. These risks are highest for babies who are born before 34 weeks of pregnancy. How is preterm labor treated? Treatment depends on the length of your pregnancy, your condition, and the health of your baby. It may involve:  Having a stitch (suture) placed in your cervix to prevent your cervix from opening too early (cerclage).  Taking or being given medicines, such as:  Hormone medicines. These may be given early in pregnancy to help support the pregnancy.  Medicine to stop contractions.  Medicines to help mature the baby's lungs. These may be prescribed if the risk of delivery is high.  Medicines to prevent your baby from developing cerebral palsy. If the labor happens before 34 weeks of pregnancy, you may need to stay in the hospital. What should I do if I think I am in preterm labor? If you think that you are going into preterm labor, call your health care provider right away. How can I prevent preterm labor in future pregnancies? To increase your chance of having a full-term pregnancy:  Do not use any tobacco products, such   as cigarettes, chewing tobacco, and e-cigarettes. If you need help quitting, ask your health care provider.  Do not use street drugs or medicines that have not been prescribed to you during your pregnancy.  Talk with your health care provider before taking any herbal supplements, even if you have been taking them regularly.  Make sure you gain a healthy amount of weight during your pregnancy.  Watch for  infection. If you think that you might have an infection, get it checked right away.  Make sure to tell your health care provider if you have gone into preterm labor before. This information is not intended to replace advice given to you by your health care provider. Make sure you discuss any questions you have with your health care provider. Document Released: 05/13/2003 Document Revised: 08/03/2015 Document Reviewed: 07/14/2015 Elsevier Interactive Patient Education  2017 Elsevier Inc.  

## 2016-04-15 ENCOUNTER — Telehealth: Payer: Self-pay | Admitting: Obstetrics and Gynecology

## 2016-04-15 ENCOUNTER — Emergency Department (HOSPITAL_COMMUNITY)
Admission: EM | Admit: 2016-04-15 | Discharge: 2016-04-16 | Disposition: A | Discharge status: Home / Self Care | Payer: Medicaid Other

## 2016-04-15 NOTE — Telephone Encounter (Signed)
Patient spoken with as I passed thru ED waiting room, she has breast d/c with blood, no erythema, no fever, no tenderness.  No Ob complaints voiced.  Has appt 2. 19. 2018 Pt offered an appt Monday if she wants assessment in the office, and she likes that suggestion in preference to option of continued wait for eval tonight.. Pt to call office Monday for time of appt.

## 2016-04-16 NOTE — ED Notes (Signed)
No answer in waiting room,  

## 2016-04-16 NOTE — ED Notes (Signed)
No answer in waiting room 

## 2016-04-17 ENCOUNTER — Ambulatory Visit (INDEPENDENT_AMBULATORY_CARE_PROVIDER_SITE_OTHER): Payer: Medicaid Other | Admitting: Obstetrics and Gynecology

## 2016-04-17 ENCOUNTER — Other Ambulatory Visit (HOSPITAL_COMMUNITY)
Admission: RE | Admit: 2016-04-17 | Discharge: 2016-04-17 | Disposition: A | Discharge status: Home / Self Care | Payer: Medicaid Other | Source: Ambulatory Visit | Attending: Obstetrics and Gynecology | Admitting: Obstetrics and Gynecology

## 2016-04-17 ENCOUNTER — Encounter: Payer: Self-pay | Admitting: Obstetrics and Gynecology

## 2016-04-17 VITALS — BP 106/58 | HR 72 | Wt 201.8 lb

## 2016-04-17 DIAGNOSIS — Z3A36 36 weeks gestation of pregnancy: Secondary | ICD-10-CM

## 2016-04-17 DIAGNOSIS — O9229 Other disorders of breast associated with pregnancy and the puerperium: Secondary | ICD-10-CM

## 2016-04-17 DIAGNOSIS — N6452 Nipple discharge: Secondary | ICD-10-CM

## 2016-04-17 DIAGNOSIS — Z1389 Encounter for screening for other disorder: Secondary | ICD-10-CM

## 2016-04-17 DIAGNOSIS — Z331 Pregnant state, incidental: Secondary | ICD-10-CM

## 2016-04-17 LAB — POCT URINALYSIS DIPSTICK
Blood, UA: NEGATIVE
Glucose, UA: NEGATIVE
Ketones, UA: NEGATIVE
Nitrite, UA: NEGATIVE

## 2016-04-17 NOTE — Progress Notes (Signed)
Patient ID: Samantha Sandoval, female   DOB: 08/08/1995, 21 y.o.   MRN: [id number]  [facility]   G1P0  Estimated Date of Delivery: 05/15/16 LROB 8458w0d  No chief complaint on file. ____  Patient complaints: Bilateral breast d/c with blood. She denies erythema, fever or tenderness. Pt is concerned as she wants to breast feed.   Patient reports good fetal movement. She denies any bleeding, rupture of membranes,or regular contractions.  Last menstrual period 08/09/2015.   Urine results:notable for trace protein, 2+ leukocytes  refer to the ob flow sheet for FH and FHR, ,                          Physical Examination: General appearance - alert, well appearing, and in no distress                                      Abdomen -FHR 138                                                         soft, nontender, nondistended, no masses or organomegaly                                    Breast- At 3 o'clock on the right nipple, there a single duct that is producing bloody discharge. No redness, dimpling or retraction.   Secretions from right breast sent for gram stain                                              Questions were answered. 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 Await cytology on pap.  Plan:  Continued routine obstetrical care  F/u in as scheduled for routine prenatal care, [facility]   By signing my name below, I, Doreatha MartinEva Mathews, attest that this documentation has been prepared under the direction and in the presence of Tilda BurrowJohn Tamya Denardo V, MD. Electronically Signed: Doreatha MartinEva Mathews, ED Scribe. 04/17/16. 2:45 PM.  I personally performed the services described in this documentation, which was SCRIBED in my presence. The recorded information has been reviewed and considered accurate. It has been edited as necessary during review. Tilda BurrowFERGUSON,Wasif Simonich V, MD    I personally performed the services described in this documentation, which was SCRIBED in my presence. The recorded  information has been reviewed and considered accurate. It has been edited as necessary during review. Tilda BurrowFERGUSON,Nyelah Emmerich V, MD

## 2016-04-22 ENCOUNTER — Encounter (HOSPITAL_COMMUNITY): Payer: Self-pay

## 2016-04-22 ENCOUNTER — Inpatient Hospital Stay (HOSPITAL_COMMUNITY)
Admission: AD | Admit: 2016-04-22 | Discharge: 2016-04-22 | Disposition: A | Discharge status: Home / Self Care | Payer: Medicaid Other | Source: Ambulatory Visit | Attending: Family Medicine | Admitting: Family Medicine

## 2016-04-22 DIAGNOSIS — Z3A36 36 weeks gestation of pregnancy: Secondary | ICD-10-CM | POA: Diagnosis not present

## 2016-04-22 DIAGNOSIS — Z3403 Encounter for supervision of normal first pregnancy, third trimester: Secondary | ICD-10-CM

## 2016-04-22 DIAGNOSIS — Z87891 Personal history of nicotine dependence: Secondary | ICD-10-CM | POA: Insufficient documentation

## 2016-04-22 DIAGNOSIS — O26893 Other specified pregnancy related conditions, third trimester: Secondary | ICD-10-CM | POA: Insufficient documentation

## 2016-04-22 DIAGNOSIS — F410 Panic disorder [episodic paroxysmal anxiety] without agoraphobia: Secondary | ICD-10-CM | POA: Diagnosis not present

## 2016-04-22 DIAGNOSIS — Z8041 Family history of malignant neoplasm of ovary: Secondary | ICD-10-CM

## 2016-04-22 DIAGNOSIS — O99343 Other mental disorders complicating pregnancy, third trimester: Secondary | ICD-10-CM | POA: Insufficient documentation

## 2016-04-22 DIAGNOSIS — Z8659 Personal history of other mental and behavioral disorders: Secondary | ICD-10-CM | POA: Diagnosis not present

## 2016-04-22 DIAGNOSIS — R51 Headache: Secondary | ICD-10-CM | POA: Insufficient documentation

## 2016-04-22 DIAGNOSIS — R87612 Low grade squamous intraepithelial lesion on cytologic smear of cervix (LGSIL): Secondary | ICD-10-CM

## 2016-04-22 LAB — URINALYSIS, ROUTINE W REFLEX MICROSCOPIC
Bilirubin Urine: NEGATIVE
Glucose, UA: NEGATIVE mg/dL
Ketones, ur: NEGATIVE mg/dL
Nitrite: NEGATIVE
Protein, ur: 30 mg/dL — AB
Specific Gravity, Urine: 1.02 (ref 1.005–1.030)
pH: 5 (ref 5.0–8.0)

## 2016-04-22 LAB — GLUCOSE, CAPILLARY: Glucose-Capillary: 87 mg/dL (ref 65–99)

## 2016-04-22 MED ORDER — ACETAMINOPHEN 325 MG PO TABS
650.0000 mg | ORAL_TABLET | Freq: Once | ORAL | Status: AC
  Administered 2016-04-22: 650 mg via ORAL
  Filled 2016-04-22: qty 2

## 2016-04-22 NOTE — MAU Provider Note (Signed)
History     CSN: [id number]  Arrival date and time: 04/22/16 0046   None     Chief Complaint  Patient presents with  . Headache  . Anxiety  . Dizziness  . Chest Pain  . Nausea  . Abdominal Pain   HPI Samantha Sandoval is a 20yo G1 @ 36.5wks who presents for eval of multiple vague complaints including sudden onset of H/A, nausea, pelvic pressure, dizziness, 'light' chest pain and possible anxiety this evening. She has a hx of panic attacks in the past but 'none in years'. Denies ctx or bldg or leaking. Her preg has been followed by the [facility] service and has been remarkable for 1) hx abnl Pap 2) hx anxiety  OB History    Gravida Para Term Preterm AB Living   1             SAB TAB Ectopic Multiple Live Births                  Past Medical History:  Diagnosis Date  . Anxiety     Past Surgical History:  Procedure Laterality Date  . ABDOMINAL SURGERY    . tubes in ears    . 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 Diabetes Maternal Grandmother   . Hypertension Maternal Grandmother   . COPD Maternal Grandmother   . Scoliosis Maternal Grandmother   . Heart disease Maternal Grandmother   . Hypertension Maternal Grandfather   . Other Maternal Grandfather     2 stents in heart  . Heart attack Paternal Grandfather   . Congestive Heart Failure Neg Hx     Social History  Substance Use Topics  . Smoking status: Former Smoker    Types: Cigarettes  . Smokeless tobacco: Never Used  . Alcohol use No     Comment: none    Allergies: No Known Allergies  Prescriptions Prior to Admission  Medication Sig Dispense Refill Last Dose  . Prenatal Vit-Fe Fumarate-FA (MULTIVITAMIN-PRENATAL) 27-0.8 MG TABS tablet Take 1 tablet by mouth daily at 12 noon.   Past Week at Unknown time  . acetaminophen (TYLENOL) 500 MG tablet Take 1 tablet (500 mg total) by mouth every 6 (six) hours as needed. (Patient not taking: Reported on 03/09/2016) 30 tablet 0 Not  Taking  . Doxylamine-Pyridoxine 10-10 MG TBEC 2 PO qhs; may take 1po in am and 1po in afternoon prn nausea (Patient not taking: Reported on 04/10/2016) 120 tablet 3 Not Taking  . metroNIDAZOLE (FLAGYL) 500 MG tablet Take 1 tablet (500 mg total) by mouth 2 (two) times daily. (Patient not taking: Reported on 04/10/2016) 14 tablet 0 Not Taking    Review of Systems No other pertinents other than what is listed in HPI Physical Exam   Blood pressure 101/56, pulse 84, temperature 97.8 F (36.6 C), temperature source Oral, resp. rate 16, height 5\' 3"  (1.6 m), weight 91.4 kg (201 lb 6.4 oz), last menstrual period 08/09/2015, SpO2 98 %.  Physical Exam  Constitutional: She is oriented to person, place, and time. She appears well-developed.  HENT:  Head: Normocephalic.  Neck: Normal range of motion.  Cardiovascular: Normal rate.   Respiratory: Effort normal.  GI:  EFM 110-120, +accels, no decels, occ variables No ctx  Genitourinary:  Genitourinary Comments: Cx C/L  Musculoskeletal: Normal range of motion.  Neurological: She is alert and oriented to person, place, and time.  Skin: Skin is  warm and dry.  Psychiatric: She has a normal mood and affect. Her behavior is normal. Thought content normal.   Urinalysis    Component Value Date/Time   COLORURINE YELLOW 04/22/2016 0105   APPEARANCEUR CLOUDY (A) 04/22/2016 0105   APPEARANCEUR Clear 10/06/2015 1632   LABSPEC 1.020 04/22/2016 0105   PHURINE 5.0 04/22/2016 0105   GLUCOSEU NEGATIVE 04/22/2016 0105   HGBUR LARGE (A) 04/22/2016 0105   BILIRUBINUR NEGATIVE 04/22/2016 0105   BILIRUBINUR Negative 10/06/2015 1632   KETONESUR NEGATIVE 04/22/2016 0105   PROTEINUR 30 (A) 04/22/2016 0105   NITRITE NEGATIVE 04/22/2016 0105   LEUKOCYTESUR MODERATE (A) 04/22/2016 0105   LEUKOCYTESUR Negative 10/06/2015 1632   Micro: many bacteria, 0-5SE  CBG: 87  EKG: essentially nl  MAU Course  Procedures  MDM UA ordered and urine to culture EKG  ordered Given Tylenol for H/A- improved   Assessment and Plan  IUP@36 .5wks H/A in preg Panic attack  D/C home with labor precautions F/U as scheduled at next FT visit  Cam HaiSHAW, Keino Placencia CNM 04/22/2016, 1:48 AM

## 2016-04-22 NOTE — Discharge Instructions (Signed)
Braxton Hicks Contractions °Contractions of the uterus can occur throughout pregnancy. Contractions are not always a sign that you are in labor.  °WHAT ARE BRAXTON HICKS CONTRACTIONS?  °Contractions that occur before labor are called Braxton Hicks contractions, or false labor. Toward the end of pregnancy (32-34 weeks), these contractions can develop more often and may become more forceful. This is not true labor because these contractions do not result in opening (dilatation) and thinning of the cervix. They are sometimes difficult to tell apart from true labor because these contractions can be forceful and people have different pain tolerances. You should not feel embarrassed if you go to the hospital with false labor. Sometimes, the only way to tell if you are in true labor is for your health care provider to look for changes in the cervix. °If there are no prenatal problems or other health problems associated with the pregnancy, it is completely safe to be sent home with false labor and await the onset of true labor. °HOW CAN YOU TELL THE DIFFERENCE BETWEEN TRUE AND FALSE LABOR? °False Labor  °· The contractions of false labor are usually shorter and not as hard as those of true labor.   °· The contractions are usually irregular.   °· The contractions are often felt in the front of the lower abdomen and in the groin.   °· The contractions may go away when you walk around or change positions while lying down.   °· The contractions get weaker and are shorter lasting as time goes on.   °· The contractions do not usually become progressively stronger, regular, and closer together as with true labor.   °True Labor  °· Contractions in true labor last 30-70 seconds, become very regular, usually become more intense, and increase in frequency.   °· The contractions do not go away with walking.   °· The discomfort is usually felt in the top of the uterus and spreads to the lower abdomen and low back.   °· True labor can be  determined by your health care provider with an exam. This will show that the cervix is dilating and getting thinner.   °WHAT TO REMEMBER °· Keep up with your usual exercises and follow other instructions given by your health care provider.   °· Take medicines as directed by your health care provider.   °· Keep your regular prenatal appointments.   °· Eat and drink lightly if you think you are going into labor.   °· If Braxton Hicks contractions are making you uncomfortable:   °¨ Change your position from lying down or resting to walking, or from walking to resting.   °¨ Sit and rest in a tub of warm water.   °¨ Drink 2-3 glasses of water. Dehydration may cause these contractions.   °¨ Do slow and deep breathing several times an hour.   °WHEN SHOULD I SEEK IMMEDIATE MEDICAL CARE? °Seek immediate medical care if: °· Your contractions become stronger, more regular, and closer together.   °· You have fluid leaking or gushing from your vagina.   °· You have a fever.   °· You pass blood-tinged mucus.   °· You have vaginal bleeding.   °· You have continuous abdominal pain.   °· You have low back pain that you never had before.   °· You feel your baby's head pushing down and causing pelvic pressure.   °· Your baby is not moving as much as it used to.   °This information is not intended to replace advice given to you by your health care provider. Make sure you discuss any questions you have with your health care   provider. °Document Released: 02/20/2005 Document Revised: 06/14/2015 Document Reviewed: 12/02/2012 °Elsevier Interactive Patient Education © 2017 Elsevier Inc. °Introduction °Patient Name: ________________________________________________ Patient Due Date: ____________________ °What is a fetal movement count? °A fetal movement count is the number of times that you feel your baby move during a certain amount of time. This may also be called a fetal kick count. A fetal movement count is recommended for every pregnant  woman. You may be asked to start counting fetal movements as early as week 28 of your pregnancy. °Pay attention to when your baby is most active. You may notice your baby's sleep and wake cycles. You may also notice things that make your baby move more. You should do a fetal movement count: °· When your baby is normally most active. °· At the same time each day. °A good time to count movements is while you are resting, after having something to eat and drink. °How do I count fetal movements? °1. Find a quiet, comfortable area. Sit, or lie down on your side. °2. Write down the date, the start time and stop time, and the number of movements that you felt between those two times. Take this information with you to your health care visits. °3. For 2 hours, count kicks, flutters, swishes, rolls, and jabs. You should feel at least 10 movements during 2 hours. °4. You may stop counting after you have felt 10 movements. °5. If you do not feel 10 movements in 2 hours, have something to eat and drink. Then, keep resting and counting for 1 hour. If you feel at least 4 movements during that hour, you may stop counting. °Contact a health care provider if: °· You feel fewer than 4 movements in 2 hours. °· Your baby is not moving like he or she usually does. °Date: ____________ Start time: ____________ Stop time: ____________ Movements: ____________ °Date: ____________ Start time: ____________ Stop time: ____________ Movements: ____________ °Date: ____________ Start time: ____________ Stop time: ____________ Movements: ____________ °Date: ____________ Start time: ____________ Stop time: ____________ Movements: ____________ °Date: ____________ Start time: ____________ Stop time: ____________ Movements: ____________ °Date: ____________ Start time: ____________ Stop time: ____________ Movements: ____________ °Date: ____________ Start time: ____________ Stop time: ____________ Movements: ____________ °Date: ____________ Start time:  ____________ Stop time: ____________ Movements: ____________ °Date: ____________ Start time: ____________ Stop time: ____________ Movements: ____________ °This information is not intended to replace advice given to you by your health care provider. Make sure you discuss any questions you have with your health care provider. °Document Released: 03/22/2006 Document Revised: 10/20/2015 Document Reviewed: 04/01/2015 °Elsevier Interactive Patient Education © 2017 Elsevier Inc. °Third Trimester of Pregnancy °The third trimester is from week 29 through week 42, months 7 through 9. This trimester is when your unborn baby (fetus) is growing very fast. At the end of the ninth month, the unborn baby is about 20 inches in length. It weighs about 6-10 pounds. °Follow these instructions at home: °· Avoid all smoking, herbs, and alcohol. Avoid drugs not approved by your doctor. °· Do not use any tobacco products, including cigarettes, chewing tobacco, and electronic cigarettes. If you need help quitting, ask your doctor. You may get counseling or other support to help you quit. °· Only take medicine as told by your doctor. Some medicines are safe and some are not during pregnancy. °· Exercise only as told by your doctor. Stop exercising if you start having cramps. °· Eat regular, healthy meals. °· Wear a good support bra if your breasts are tender. °·   Do not use hot tubs, steam rooms, or saunas.  Wear your seat belt when driving.  Avoid raw meat, uncooked cheese, and liter boxes and soil used by cats.  Take your prenatal vitamins.  Take 1500-2000 milligrams of calcium daily starting at the 20th week of pregnancy until you deliver your baby.  Try taking medicine that helps you poop (stool softener) as needed, and if your doctor approves. Eat more fiber by eating fresh fruit, vegetables, and whole grains. Drink enough fluids to keep your pee (urine) clear or pale yellow.  Take warm water baths (sitz baths) to soothe pain  or discomfort caused by hemorrhoids. Use hemorrhoid cream if your doctor approves.  If you have puffy, bulging veins (varicose veins), wear support hose. Raise (elevate) your feet for 15 minutes, 3-4 times a day. Limit salt in your diet.  Avoid heavy lifting, wear low heels, and sit up straight.  Rest with your legs raised if you have leg cramps or low back pain.  Visit your dentist if you have not gone during your pregnancy. Use a soft toothbrush to brush your teeth. Be gentle when you floss.  You can have sex (intercourse) unless your doctor tells you not to.  Do not travel far distances unless you must. Only do so with your doctor's approval.  Take prenatal classes.  Practice driving to the hospital.  Pack your hospital bag.  Prepare the baby's room.  Go to your doctor visits. Get help if:  You are not sure if you are in labor or if your water has broken.  You are dizzy.  You have mild cramps or pressure in your lower belly (abdominal).  You have a nagging pain in your belly area.  You continue to feel sick to your stomach (nauseous), throw up (vomit), or have watery poop (diarrhea).  You have bad smelling fluid coming from your vagina.  You have pain with peeing (urination). Get help right away if:  You have a fever.  You are leaking fluid from your vagina.  You are spotting or bleeding from your vagina.  You have severe belly cramping or pain.  You lose or gain weight rapidly.  You have trouble catching your breath and have chest pain.  You notice sudden or extreme puffiness (swelling) of your face, hands, ankles, feet, or legs.  You have not felt the baby move in over an hour.  You have severe headaches that do not go away with medicine.  You have vision changes. This information is not intended to replace advice given to you by your health care provider. Make sure you discuss any questions you have with your health care provider. Document Released:  05/17/2009 Document Revised: 07/29/2015 Document Reviewed: 04/23/2012 Elsevier Interactive Patient Education  2017 [employer]

## 2016-04-22 NOTE — MAU Note (Signed)
My body feels weird. Dizzy, headache, felt like was gonna have anxiety attack which has not had in yrs. Heart rate felt fast.  Having pain in lower back and lower abd with a lot of pelvic pressure. Denies LOF or bleeding. Some vag d/c that is clear

## 2016-04-23 LAB — CULTURE, OB URINE: Culture: <10000 — AB

## 2016-04-24 ENCOUNTER — Ambulatory Visit (INDEPENDENT_AMBULATORY_CARE_PROVIDER_SITE_OTHER): Payer: Medicaid Other | Admitting: Obstetrics & Gynecology

## 2016-04-24 ENCOUNTER — Encounter: Payer: Medicaid Other | Admitting: Obstetrics & Gynecology

## 2016-04-24 ENCOUNTER — Encounter: Payer: Self-pay | Admitting: Obstetrics & Gynecology

## 2016-04-24 VITALS — BP 100/70 | HR 80 | Wt 202.0 lb

## 2016-04-24 DIAGNOSIS — Z1389 Encounter for screening for other disorder: Secondary | ICD-10-CM

## 2016-04-24 DIAGNOSIS — O368130 Decreased fetal movements, third trimester, not applicable or unspecified: Secondary | ICD-10-CM

## 2016-04-24 DIAGNOSIS — Z3A37 37 weeks gestation of pregnancy: Secondary | ICD-10-CM

## 2016-04-24 DIAGNOSIS — Z331 Pregnant state, incidental: Secondary | ICD-10-CM

## 2016-04-24 DIAGNOSIS — Z3403 Encounter for supervision of normal first pregnancy, third trimester: Secondary | ICD-10-CM

## 2016-04-24 DIAGNOSIS — Z3685 Encounter for antenatal screening for Streptococcus B: Secondary | ICD-10-CM

## 2016-04-24 LAB — POCT URINALYSIS DIPSTICK
Blood, UA: NEGATIVE
Glucose, UA: 1
Ketones, UA: NEGATIVE
Leukocytes, UA: NEGATIVE
Nitrite, UA: NEGATIVE

## 2016-04-24 NOTE — Progress Notes (Signed)
G1P0 7836w0d Estimated Date of Delivery: 05/15/16  Blood pressure 100/70, pulse 80, weight 202 lb (91.6 kg), last menstrual period 08/09/2015.   BP weight and urine results all reviewed and noted.  Please refer to the obstetrical flow sheet for the fundal height and fetal heart rate documentation:  Patient reports good fetal movement, denies any bleeding and no rupture of membranes symptoms or regular contractions. Patient is without complaints. All questions were answered.  Orders Placed This Encounter  Procedures  . GC/Chlamydia Probe Amp  . Strep Gp B NAA  . POCT urinalysis dipstick    Plan:  Continued routine obstetrical care, cervical  LTC  Reactive NST/  Return in about 1 week (around 05/01/2016) for LROB.

## 2016-04-26 LAB — OB RESULTS CONSOLE GBS: GBS: POSITIVE

## 2016-04-27 ENCOUNTER — Encounter: Payer: Self-pay | Admitting: Obstetrics & Gynecology

## 2016-04-27 LAB — GC/CHLAMYDIA PROBE AMP
Chlamydia trachomatis, NAA: NEGATIVE
Neisseria gonorrhoeae by PCR: NEGATIVE

## 2016-04-27 LAB — STREP GP B NAA: Strep Gp B NAA: POSITIVE — AB

## 2016-04-28 ENCOUNTER — Encounter (HOSPITAL_COMMUNITY): Payer: Self-pay | Admitting: Certified Nurse Midwife

## 2016-04-28 ENCOUNTER — Inpatient Hospital Stay (HOSPITAL_COMMUNITY)
Admission: AD | Admit: 2016-04-28 | Discharge: 2016-04-28 | Disposition: A | Discharge status: Home / Self Care | Payer: Medicaid Other | Source: Ambulatory Visit | Attending: Obstetrics and Gynecology | Admitting: Obstetrics and Gynecology

## 2016-04-28 DIAGNOSIS — O471 False labor at or after 37 completed weeks of gestation: Secondary | ICD-10-CM

## 2016-04-28 DIAGNOSIS — Z3A00 Weeks of gestation of pregnancy not specified: Secondary | ICD-10-CM | POA: Insufficient documentation

## 2016-04-28 DIAGNOSIS — R87612 Low grade squamous intraepithelial lesion on cytologic smear of cervix (LGSIL): Secondary | ICD-10-CM

## 2016-04-28 DIAGNOSIS — Z3403 Encounter for supervision of normal first pregnancy, third trimester: Secondary | ICD-10-CM

## 2016-04-28 DIAGNOSIS — Z8041 Family history of malignant neoplasm of ovary: Secondary | ICD-10-CM

## 2016-04-28 NOTE — MAU Note (Signed)
Patient presents to MAU with complaints of contractions every 30 minutes and vaginal pressure. No LOF or vaginal bleedings. +FM.

## 2016-04-28 NOTE — MAU Note (Signed)
I have communicated with Dr Earlene Plater and reviewed vital signs:  Vitals:   04/28/16 2220  BP: 98/57  Pulse: 90  Resp: 16  Temp: 98.1 F (36.7 C)    Vaginal exam:  Dilation: Closed Effacement (%): Thick Cervical Position: Middle Station: -2 Presentation: Vertex Exam by:: Lanice Shirts RN ,   Also reviewed contraction pattern and that non-stress test is reactive.  It has been documented that patient is contracting every 0 minutes with no cervical change, not indicating active labor.  Patient denies any other complaints.  Based on this report provider has given order for discharge.  A discharge diagnosis will be entered by a provider.  Labor discharge instructions reviewed with patient.

## 2016-04-28 NOTE — Discharge Instructions (Signed)
Braxton Hicks Contractions °Contractions of the uterus can occur throughout pregnancy. Contractions are not always a sign that you are in labor.  °WHAT ARE BRAXTON HICKS CONTRACTIONS?  °Contractions that occur before labor are called Braxton Hicks contractions, or false labor. Toward the end of pregnancy (32-34 weeks), these contractions can develop more often and may become more forceful. This is not true labor because these contractions do not result in opening (dilatation) and thinning of the cervix. They are sometimes difficult to tell apart from true labor because these contractions can be forceful and people have different pain tolerances. You should not feel embarrassed if you go to the hospital with false labor. Sometimes, the only way to tell if you are in true labor is for your health care provider to look for changes in the cervix. °If there are no prenatal problems or other health problems associated with the pregnancy, it is completely safe to be sent home with false labor and await the onset of true labor. °HOW CAN YOU TELL THE DIFFERENCE BETWEEN TRUE AND FALSE LABOR? °False Labor  °· The contractions of false labor are usually shorter and not as hard as those of true labor.   °· The contractions are usually irregular.   °· The contractions are often felt in the front of the lower abdomen and in the groin.   °· The contractions may go away when you walk around or change positions while lying down.   °· The contractions get weaker and are shorter lasting as time goes on.   °· The contractions do not usually become progressively stronger, regular, and closer together as with true labor.   °True Labor  °· Contractions in true labor last 30-70 seconds, become very regular, usually become more intense, and increase in frequency.   °· The contractions do not go away with walking.   °· The discomfort is usually felt in the top of the uterus and spreads to the lower abdomen and low back.   °· True labor can be  determined by your health care provider with an exam. This will show that the cervix is dilating and getting thinner.   °WHAT TO REMEMBER °· Keep up with your usual exercises and follow other instructions given by your health care provider.   °· Take medicines as directed by your health care provider.   °· Keep your regular prenatal appointments.   °· Eat and drink lightly if you think you are going into labor.   °· If Braxton Hicks contractions are making you uncomfortable:   °¨ Change your position from lying down or resting to walking, or from walking to resting.   °¨ Sit and rest in a tub of warm water.   °¨ Drink 2-3 glasses of water. Dehydration may cause these contractions.   °¨ Do slow and deep breathing several times an hour.   °WHEN SHOULD I SEEK IMMEDIATE MEDICAL CARE? °Seek immediate medical care if: °· Your contractions become stronger, more regular, and closer together.   °· You have fluid leaking or gushing from your vagina.   °· You have a fever.   °· You pass blood-tinged mucus.   °· You have vaginal bleeding.   °· You have continuous abdominal pain.   °· You have low back pain that you never had before.   °· You feel your baby's head pushing down and causing pelvic pressure.   °· Your baby is not moving as much as it used to.   °This information is not intended to replace advice given to you by your health care provider. Make sure you discuss any questions you have with your health care   provider. °Document Released: 02/20/2005 Document Revised: 06/14/2015 Document Reviewed: 12/02/2012 °Elsevier Interactive Patient Education © 2017 [employer] ° °

## 2016-05-02 ENCOUNTER — Encounter: Payer: Self-pay | Admitting: Obstetrics & Gynecology

## 2016-05-02 ENCOUNTER — Telehealth: Payer: Self-pay | Admitting: *Deleted

## 2016-05-02 NOTE — Telephone Encounter (Signed)
Attempted to call patient

## 2016-05-04 ENCOUNTER — Encounter: Payer: Self-pay | Admitting: Advanced Practice Midwife

## 2016-05-04 ENCOUNTER — Ambulatory Visit (INDEPENDENT_AMBULATORY_CARE_PROVIDER_SITE_OTHER): Payer: Medicaid Other | Admitting: Advanced Practice Midwife

## 2016-05-04 VITALS — BP 126/70 | HR 93 | Wt 206.0 lb

## 2016-05-04 DIAGNOSIS — Z3403 Encounter for supervision of normal first pregnancy, third trimester: Secondary | ICD-10-CM

## 2016-05-04 DIAGNOSIS — Z1389 Encounter for screening for other disorder: Secondary | ICD-10-CM

## 2016-05-04 DIAGNOSIS — Z331 Pregnant state, incidental: Secondary | ICD-10-CM

## 2016-05-04 DIAGNOSIS — Z3A38 38 weeks gestation of pregnancy: Secondary | ICD-10-CM

## 2016-05-04 LAB — POCT URINALYSIS DIPSTICK
Blood, UA: NEGATIVE
Glucose, UA: NEGATIVE
Ketones, UA: NEGATIVE
Nitrite, UA: NEGATIVE

## 2016-05-04 NOTE — Progress Notes (Signed)
G1P0 7966w3d Estimated Date of Delivery: 05/15/16  Blood pressure 126/70, pulse 93, weight 206 lb (93.4 kg), last menstrual period 08/09/2015.   BP weight and urine results all reviewed and noted.  Please refer to the obstetrical flow sheet for the fundal height and fetal heart rate documentation:  Patient reports good fetal movement, denies any bleeding and no rupture of membranes symptoms or regular contractions. Patient is without complaints. All questions were answered.  Orders Placed This Encounter  Procedures  . POCT urinalysis dipstick    Plan:  Continued routine obstetrical care,   Return in about 1 week (around 05/11/2016) for LROB.

## 2016-05-05 ENCOUNTER — Encounter (HOSPITAL_COMMUNITY): Payer: Self-pay | Admitting: *Deleted

## 2016-05-05 ENCOUNTER — Inpatient Hospital Stay (HOSPITAL_COMMUNITY)
Admission: AD | Admit: 2016-05-05 | Discharge: 2016-05-05 | Disposition: A | Discharge status: Home / Self Care | Payer: Medicaid Other | Source: Ambulatory Visit | Attending: Obstetrics and Gynecology | Admitting: Obstetrics and Gynecology

## 2016-05-05 DIAGNOSIS — Z3A00 Weeks of gestation of pregnancy not specified: Secondary | ICD-10-CM | POA: Insufficient documentation

## 2016-05-05 DIAGNOSIS — O471 False labor at or after 37 completed weeks of gestation: Secondary | ICD-10-CM | POA: Insufficient documentation

## 2016-05-05 DIAGNOSIS — Z8041 Family history of malignant neoplasm of ovary: Secondary | ICD-10-CM

## 2016-05-05 DIAGNOSIS — R87612 Low grade squamous intraepithelial lesion on cytologic smear of cervix (LGSIL): Secondary | ICD-10-CM

## 2016-05-05 NOTE — MAU Note (Signed)
Pt reports her mucus plug came out and she started having contractions about 10-15 min apart. Denies any bleeding but reports decreased fetal movement

## 2016-05-05 NOTE — MAU Note (Signed)
I have communicated with Philipp DeputyKim Shaw, CNM  and reviewed vital signs:  Vitals:   05/05/16 2303  BP: 125/69  Pulse: 110  Resp: 18  Temp: 98.1 F (36.7 C)    Vaginal exam:  Dilation: Fingertip Effacement (%): 50 Station: -2 Presentation: Vertex Exam by:: K.Jury Caserta,RN,   Also reviewed contraction pattern and that non-stress test is reactive.  It has been documented that patient is contracting every 15 minutes with no cervical change over 24 hours not indicating active labor.  Patient denies any other complaints.  Based on this report provider has given order for discharge.  A discharge diagnosis will be entered by a provider.  Labor discharge instructions reviewed with patient.

## 2016-05-06 DIAGNOSIS — O471 False labor at or after 37 completed weeks of gestation: Secondary | ICD-10-CM | POA: Diagnosis not present

## 2016-05-06 DIAGNOSIS — Z3A00 Weeks of gestation of pregnancy not specified: Secondary | ICD-10-CM | POA: Diagnosis not present

## 2016-05-11 ENCOUNTER — Encounter: Payer: Self-pay | Admitting: Advanced Practice Midwife

## 2016-05-11 ENCOUNTER — Ambulatory Visit (INDEPENDENT_AMBULATORY_CARE_PROVIDER_SITE_OTHER): Payer: Medicaid Other | Admitting: Advanced Practice Midwife

## 2016-05-11 VITALS — BP 128/62 | HR 84 | Wt 208.0 lb

## 2016-05-11 DIAGNOSIS — Z3A39 39 weeks gestation of pregnancy: Secondary | ICD-10-CM

## 2016-05-11 DIAGNOSIS — Z3403 Encounter for supervision of normal first pregnancy, third trimester: Secondary | ICD-10-CM | POA: Diagnosis not present

## 2016-05-11 DIAGNOSIS — Z1389 Encounter for screening for other disorder: Secondary | ICD-10-CM

## 2016-05-11 DIAGNOSIS — Z331 Pregnant state, incidental: Secondary | ICD-10-CM

## 2016-05-11 LAB — POCT URINALYSIS DIPSTICK
Blood, UA: NEGATIVE
Glucose, UA: NEGATIVE
Ketones, UA: NEGATIVE
Nitrite, UA: NEGATIVE

## 2016-05-11 NOTE — Progress Notes (Signed)
G1P0 7775w3d Estimated Date of Delivery: 05/15/16  Blood pressure 128/62, pulse 84, weight 208 lb (94.3 kg), last menstrual period 08/09/2015.   BP weight and urine results all reviewed and noted.  Please refer to the obstetrical flow sheet for the fundal height and fetal heart rate documentation:  Patient reports good fetal movement, denies any bleeding and no rupture of membranes symptoms or regular contractions. Patient is without complaints. Rash on left antecubital today, itchy.  Try hydrocortisione.  All questions were answered.  Orders Placed This Encounter  Procedures  . POCT urinalysis dipstick    Plan:  Continued routine obstetrical care,   Return in about 1 week (around 05/18/2016) for LROB.

## 2016-05-12 ENCOUNTER — Encounter (HOSPITAL_COMMUNITY): Payer: Self-pay

## 2016-05-12 ENCOUNTER — Inpatient Hospital Stay (HOSPITAL_COMMUNITY)
Admission: AD | Admit: 2016-05-12 | Discharge: 2016-05-12 | Disposition: A | Discharge status: Home / Self Care | Payer: Medicaid Other | Source: Ambulatory Visit | Attending: Obstetrics & Gynecology | Admitting: Obstetrics & Gynecology

## 2016-05-12 ENCOUNTER — Inpatient Hospital Stay (EMERGENCY_DEPARTMENT_HOSPITAL)
Admission: AD | Admit: 2016-05-12 | Discharge: 2016-05-13 | Disposition: A | Discharge status: Home / Self Care | Payer: Medicaid Other | Source: Ambulatory Visit | Attending: Obstetrics & Gynecology | Admitting: Obstetrics & Gynecology

## 2016-05-12 DIAGNOSIS — O23593 Infection of other part of genital tract in pregnancy, third trimester: Secondary | ICD-10-CM | POA: Diagnosis not present

## 2016-05-12 DIAGNOSIS — N760 Acute vaginitis: Secondary | ICD-10-CM | POA: Insufficient documentation

## 2016-05-12 DIAGNOSIS — B9689 Other specified bacterial agents as the cause of diseases classified elsewhere: Secondary | ICD-10-CM | POA: Diagnosis not present

## 2016-05-12 DIAGNOSIS — R87612 Low grade squamous intraepithelial lesion on cytologic smear of cervix (LGSIL): Secondary | ICD-10-CM

## 2016-05-12 DIAGNOSIS — Z87891 Personal history of nicotine dependence: Secondary | ICD-10-CM | POA: Diagnosis not present

## 2016-05-12 DIAGNOSIS — O471 False labor at or after 37 completed weeks of gestation: Secondary | ICD-10-CM | POA: Diagnosis not present

## 2016-05-12 DIAGNOSIS — R109 Unspecified abdominal pain: Secondary | ICD-10-CM | POA: Diagnosis present

## 2016-05-12 DIAGNOSIS — Z3A39 39 weeks gestation of pregnancy: Secondary | ICD-10-CM | POA: Insufficient documentation

## 2016-05-12 DIAGNOSIS — Z8041 Family history of malignant neoplasm of ovary: Secondary | ICD-10-CM

## 2016-05-12 LAB — URINALYSIS, ROUTINE W REFLEX MICROSCOPIC
Bilirubin Urine: NEGATIVE
Glucose, UA: 50 mg/dL — AB
Hgb urine dipstick: NEGATIVE
Ketones, ur: NEGATIVE mg/dL
Nitrite: NEGATIVE
Protein, ur: NEGATIVE mg/dL
Specific Gravity, Urine: 1.014 (ref 1.005–1.030)
pH: 6 (ref 5.0–8.0)

## 2016-05-12 LAB — WET PREP, GENITAL
Sperm: NONE SEEN
Trich, Wet Prep: NONE SEEN
Yeast Wet Prep HPF POC: NONE SEEN

## 2016-05-12 MED ORDER — METRONIDAZOLE 500 MG PO TABS: 500.0000 mg | ORAL_TABLET | Freq: Two times a day (BID) | ORAL | 0 refills | Status: AC

## 2016-05-12 MED ORDER — ACETAMINOPHEN 325 MG PO TABS
650.0000 mg | ORAL_TABLET | Freq: Once | ORAL | Status: AC
  Administered 2016-05-12: 650 mg via ORAL
  Filled 2016-05-12: qty 2

## 2016-05-12 NOTE — Discharge Instructions (Signed)

## 2016-05-12 NOTE — Discharge Instructions (Signed)

## 2016-05-12 NOTE — MAU Provider Note (Signed)
History     CSN: [id number]  Arrival date and time: 05/12/16 1527   None     Chief Complaint  Patient presents with  . Abdominal Pain  . Dizziness  . Diarrhea   Patient is a 21 year old G1 P0 at 39 weeks and 4 days who presents with concern for rupture and some mild contractions. The delivery she notes she has had several episodes of diarrhea over the past few days and maybe an episode or 2 of dizziness. She reports the contractions started at the top of her belly feel like pins and needles go around to her vaginal area. She reports that happening every 7-10 minutes. She reports this morning when she got out of bed she had a gush of fluid and has had some minimal leaking since then. She denies fevers or chills and has no other complaints today.    OB History    Gravida Para Term Preterm AB Living   1             SAB TAB Ectopic Multiple Live Births                  Past Medical History:  Diagnosis Date  . Anxiety     Past Surgical History:  Procedure Laterality Date  . ABDOMINAL SURGERY    . tubes in ears    . 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 Diabetes Maternal Grandmother   . Hypertension Maternal Grandmother   . COPD Maternal Grandmother   . Scoliosis Maternal Grandmother   . Heart disease Maternal Grandmother   . Hypertension Maternal Grandfather   . Other Maternal Grandfather     2 stents in heart  . Heart attack Paternal Grandfather   . Congestive Heart Failure Neg Hx     Social History  Substance Use Topics  . Smoking status: Former Smoker    Types: Cigarettes  . Smokeless tobacco: Never Used  . Alcohol use No     Comment: none    Allergies: No Known Allergies  Prescriptions Prior to Admission  Medication Sig Dispense Refill Last Dose  . Prenatal Vit-Fe Fumarate-FA (MULTIVITAMIN-PRENATAL) 27-0.8 MG TABS tablet Take 1 tablet by mouth daily at 12 noon.   Taking    Review of Systems  Constitutional:  Negative for chills and fever.  HENT: Negative for congestion and rhinorrhea.   Respiratory: Negative for cough and shortness of breath.   Gastrointestinal: Positive for abdominal pain. Negative for abdominal distention, constipation, diarrhea, nausea and vomiting.  Genitourinary: Negative for difficulty urinating, dysuria and frequency.  Neurological: Negative for dizziness and weakness.   Physical Exam   Blood pressure 118/77, pulse 117, temperature 98.6 F (37 C), temperature source Oral, resp. rate 18, height 5\' 3"  (1.6 m), weight 207 lb (93.9 kg), last menstrual period 08/09/2015.  Physical Exam  Constitutional: She is oriented to person, place, and time. She appears well-developed and well-nourished.  HENT:  Head: Normocephalic and atraumatic.  Eyes: Conjunctivae are normal. Pupils are equal, round, and reactive to light.  Cardiovascular: Normal rate and intact distal pulses.   No murmur heard. Respiratory: Effort normal. No respiratory distress. She has no wheezes. She has no rales.  GI: Soft. Bowel sounds are normal. She exhibits no distension. There is no tenderness. There is no rebound and no guarding.  Genitourinary:  Genitourinary Comments: Thick white discharge in the vaginal vault, no bleeding, no pooling, negative fern  cervix 1 thick and high.  Musculoskeletal: Normal range of motion. She exhibits no edema.  Neurological: She is alert and oriented to person, place, and time. No cranial nerve deficit.  Skin: Skin is warm and dry.  Psychiatric: She has a normal mood and affect. Her behavior is normal.    MAU Course  Procedures  MDM In MA U patient underwent evaluation for rupture of membranes which revealed negative pooling and negative fern. Patient did have some watery vaginal discharge and a wet prep was sent. It was found to be positive for bacterial vaginosis. Her cervix was checked again after one hour. She had some uterine irritability but no distinct  contractions. Cervix is unchanged 1 and thick at the internal os.  Assessment and Plan  #1 bacterial vaginosis may be contributing some to patient's contractions. No episodes of diarrhea while here for observation. No dizziness while here.Marland Kitchen Discharge home with Flagyl 500 mg twice a day for 7 days.  Ernestina Penna 05/12/2016, 4:19 PM

## 2016-05-12 NOTE — MAU Note (Signed)
Was here earlier today and was told to return when ctxs closer. They are now 4-85mins apart. Some vag d/c but was told have BV

## 2016-05-12 NOTE — MAU Note (Signed)
Patient had leaking fluid around 1:30 pm today, diarrhea x 2 today, dizziness, cramping.

## 2016-05-12 NOTE — MAU Provider Note (Signed)
  History     CSN: [id number]  Arrival date and time: 05/12/16 2033   None     Chief Complaint  Patient presents with  . Contractions   HPI [name] is a 20yo G1 @ 39.4wks who presents for eval of labor. Reports ctx q 2-3 mins at home; no leaking or bldg. Her preg has [facility] and has been essentially remarkable. She has was evaluated earlier and tx for BV. Found not to be ruptured nor in labor.  OB History    Gravida Para Term Preterm AB Living   1             SAB TAB Ectopic Multiple Live Births                  Past Medical History:  Diagnosis Date  . Anxiety     Past Surgical History:  Procedure Laterality Date  . ABDOMINAL SURGERY    . tubes in ears    . 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 Diabetes Maternal Grandmother   . Hypertension Maternal Grandmother   . COPD Maternal Grandmother   . Scoliosis Maternal Grandmother   . Heart disease Maternal Grandmother   . Hypertension Maternal Grandfather   . Other Maternal Grandfather     2 stents in heart  . Heart attack Paternal Grandfather   . Congestive Heart Failure Neg Hx     Social History  Substance Use Topics  . Smoking status: Former Smoker    Types: Cigarettes  . Smokeless tobacco: Never Used  . Alcohol use No     Comment: none    Allergies: No Known Allergies  Prescriptions Prior to Admission  Medication Sig Dispense Refill Last Dose  . metroNIDAZOLE (FLAGYL) 500 MG tablet Take 1 tablet (500 mg total) by mouth 2 (two) times daily. 14 tablet 0     Review of Systems No other pertinents other than what is listed in HPI Physical Exam   Blood pressure 117/63, pulse 96, temperature 98.1 F (36.7 C), temperature source Oral, resp. rate 16, last menstrual period 08/09/2015.  Physical Exam  Constitutional: She is oriented to person, place, and time. She appears well-developed.  HENT:  Head: Normocephalic.  Neck: Normal  range of motion.  Cardiovascular: Normal rate.   Respiratory: Effort normal.  GI:  FHR 120-130s +accels, no decels Irreg ctx q 6-9 mins  Musculoskeletal: Normal range of motion.  Neurological: She is alert and oriented to person, place, and time.  Skin: Skin is warm and dry.  Psychiatric: She has a normal mood and affect. Her behavior is normal. Thought content normal.    MAU Course  Procedures  MDM Pt and family frustrated w/ lack of admission/progress. Offered an additional hour of walking, but told pt she would need to change her cx to 4cm prior to being admitted. Pt then told me to leave the room so she could get dressed to leave.  Assessment and Plan  IUP@term  False labor BV  D/C home with labor instructions F/U as scheduled at next OB visit  Cam HaiSHAW, Lillybeth Tal CNM 05/12/2016, 11:56 PM

## 2016-05-13 NOTE — Progress Notes (Signed)
I have communicated with Philipp DeputyKim Shaw CNM and reviewed vital signs:  Vitals:   05/12/16 2041 05/12/16 2103  BP: 119/67 117/63  Pulse: 102 96  Resp: 18 16  Temp: 97.7 F (36.5 C) 98.1 F (36.7 C)    Vaginal exam:  Dilation: 2 Effacement (%): 50 Cervical Position: Posterior Station: -2 Presentation: Vertex Exam by:: Avery DennisonBenji Artie Mcintyre RN,   Also reviewed contraction pattern and that non-stress test is reactive.  It has been documented that patient is contracting irregularly with no cervical change over 1 hour not indicating active labor.  Patient denies any other complaints.  Based on this report provider has given order for discharge.  A discharge order and diagnosis entered by a provider.   Labor discharge instructions reviewed with patient.

## 2016-05-15 ENCOUNTER — Encounter: Payer: Self-pay | Admitting: Women's Health

## 2016-05-16 ENCOUNTER — Inpatient Hospital Stay (HOSPITAL_COMMUNITY)
Admission: AD | Admit: 2016-05-16 | Discharge: 2016-05-18 | DRG: 775 | Disposition: A | Discharge status: Home / Self Care | Payer: Medicaid Other | Source: Ambulatory Visit | Attending: Family Medicine | Admitting: Family Medicine

## 2016-05-16 ENCOUNTER — Inpatient Hospital Stay (HOSPITAL_COMMUNITY): Payer: Medicaid Other | Admitting: Anesthesiology

## 2016-05-16 ENCOUNTER — Encounter (HOSPITAL_COMMUNITY): Payer: Self-pay

## 2016-05-16 DIAGNOSIS — Z87891 Personal history of nicotine dependence: Secondary | ICD-10-CM

## 2016-05-16 DIAGNOSIS — O4292 Full-term premature rupture of membranes, unspecified as to length of time between rupture and onset of labor: Secondary | ICD-10-CM | POA: Diagnosis present

## 2016-05-16 DIAGNOSIS — O4290 Premature rupture of membranes, unspecified as to length of time between rupture and onset of labor, unspecified weeks of gestation: Secondary | ICD-10-CM | POA: Diagnosis present

## 2016-05-16 DIAGNOSIS — O99824 Streptococcus B carrier state complicating childbirth: Secondary | ICD-10-CM | POA: Diagnosis present

## 2016-05-16 DIAGNOSIS — R87612 Low grade squamous intraepithelial lesion on cytologic smear of cervix (LGSIL): Secondary | ICD-10-CM

## 2016-05-16 DIAGNOSIS — Z3A40 40 weeks gestation of pregnancy: Secondary | ICD-10-CM

## 2016-05-16 DIAGNOSIS — O4202 Full-term premature rupture of membranes, onset of labor within 24 hours of rupture: Secondary | ICD-10-CM | POA: Diagnosis not present

## 2016-05-16 DIAGNOSIS — Z3403 Encounter for supervision of normal first pregnancy, third trimester: Secondary | ICD-10-CM

## 2016-05-16 DIAGNOSIS — Z8041 Family history of malignant neoplasm of ovary: Secondary | ICD-10-CM

## 2016-05-16 LAB — POCT FERN TEST: POCT Fern Test: POSITIVE

## 2016-05-16 LAB — CBC
HCT: 36.8 % (ref 36.0–46.0)
Hemoglobin: 12.1 g/dL (ref 12.0–15.0)
MCH: 26.5 pg (ref 26.0–34.0)
MCHC: 32.9 g/dL (ref 30.0–36.0)
MCV: 80.5 fL (ref 78.0–100.0)
Platelets: 225 10*3/uL (ref 150–400)
RBC: 4.57 MIL/uL (ref 3.87–5.11)
RDW: 14.6 % (ref 11.5–15.5)
WBC: 12.2 10*3/uL — ABNORMAL HIGH (ref 4.0–10.5)

## 2016-05-16 LAB — TYPE AND SCREEN
ABO/RH(D): O POS
Antibody Screen: NEGATIVE

## 2016-05-16 LAB — ABO/RH: ABO/RH(D): O POS

## 2016-05-16 MED ORDER — WITCH HAZEL-GLYCERIN EX PADS: 1.0000 "application " | MEDICATED_PAD | CUTANEOUS | Status: DC | PRN

## 2016-05-16 MED ORDER — EPHEDRINE 5 MG/ML INJ
10.0000 mg | INTRAVENOUS | Status: DC | PRN
  Filled 2016-05-16: qty 4

## 2016-05-16 MED ORDER — DIPHENHYDRAMINE HCL 25 MG PO CAPS: 25.0000 mg | ORAL_CAPSULE | Freq: Four times a day (QID) | ORAL | Status: DC | PRN

## 2016-05-16 MED ORDER — LACTATED RINGERS IV SOLN
500.0000 mL | Freq: Once | INTRAVENOUS | Status: AC
  Administered 2016-05-16: 500 mL via INTRAVENOUS

## 2016-05-16 MED ORDER — OXYTOCIN 40 UNITS IN LACTATED RINGERS INFUSION - SIMPLE MED
2.5000 [IU]/h | INTRAVENOUS | Status: DC
  Filled 2016-05-16: qty 1000

## 2016-05-16 MED ORDER — BENZOCAINE-MENTHOL 20-0.5 % EX AERO
1.0000 "application " | INHALATION_SPRAY | CUTANEOUS | Status: DC | PRN
  Administered 2016-05-16 – 2016-05-17 (×2): 1 via TOPICAL
  Filled 2016-05-16 (×2): qty 56

## 2016-05-16 MED ORDER — IBUPROFEN 600 MG PO TABS
600.0000 mg | ORAL_TABLET | Freq: Four times a day (QID) | ORAL | Status: DC
  Administered 2016-05-16 – 2016-05-18 (×8): 600 mg via ORAL
  Filled 2016-05-16 (×8): qty 1

## 2016-05-16 MED ORDER — PHENYLEPHRINE 40 MCG/ML (10ML) SYRINGE FOR IV PUSH (FOR BLOOD PRESSURE SUPPORT)
80.0000 ug | PREFILLED_SYRINGE | INTRAVENOUS | Status: DC | PRN
  Filled 2016-05-16: qty 10
  Filled 2016-05-16: qty 5

## 2016-05-16 MED ORDER — COCONUT OIL OIL: 1.0000 "application " | TOPICAL_OIL | Status: DC | PRN

## 2016-05-16 MED ORDER — OXYCODONE-ACETAMINOPHEN 5-325 MG PO TABS: 1.0000 | ORAL_TABLET | ORAL | Status: DC | PRN

## 2016-05-16 MED ORDER — LIDOCAINE HCL (PF) 1 % IJ SOLN
INTRAMUSCULAR | Status: DC | PRN
  Administered 2016-05-16 (×2): 4 mL

## 2016-05-16 MED ORDER — OXYTOCIN BOLUS FROM INFUSION
500.0000 mL | Freq: Once | INTRAVENOUS | Status: AC
  Administered 2016-05-16: 500 mL via INTRAVENOUS

## 2016-05-16 MED ORDER — BUTORPHANOL TARTRATE 1 MG/ML IJ SOLN
1.0000 mg | Freq: Once | INTRAMUSCULAR | Status: AC
  Administered 2016-05-16: 1 mg via INTRAVENOUS
  Filled 2016-05-16: qty 1

## 2016-05-16 MED ORDER — FLEET ENEMA 7-19 GM/118ML RE ENEM: 1.0000 | ENEMA | RECTAL | Status: DC | PRN

## 2016-05-16 MED ORDER — LACTATED RINGERS IV SOLN
INTRAVENOUS | Status: DC
  Administered 2016-05-16 (×3): via INTRAVENOUS

## 2016-05-16 MED ORDER — SOD CITRATE-CITRIC ACID 500-334 MG/5ML PO SOLN: 30.0000 mL | ORAL | Status: DC | PRN

## 2016-05-16 MED ORDER — ONDANSETRON HCL 4 MG PO TABS: 4.0000 mg | ORAL_TABLET | ORAL | Status: DC | PRN

## 2016-05-16 MED ORDER — FENTANYL 2.5 MCG/ML BUPIVACAINE 1/10 % EPIDURAL INFUSION (WH - ANES)
14.0000 mL/h | INTRAMUSCULAR | Status: DC | PRN
Start: 2016-05-16 — End: 2016-05-16
  Administered 2016-05-16 (×2): 14 mL/h via EPIDURAL
  Filled 2016-05-16: qty 100

## 2016-05-16 MED ORDER — OXYCODONE-ACETAMINOPHEN 5-325 MG PO TABS: 2.0000 | ORAL_TABLET | ORAL | Status: DC | PRN

## 2016-05-16 MED ORDER — ACETAMINOPHEN 325 MG PO TABS: 650.0000 mg | ORAL_TABLET | ORAL | Status: DC | PRN

## 2016-05-16 MED ORDER — PENICILLIN G POTASSIUM 5000000 UNITS IJ SOLR
5.0000 10*6.[IU] | Freq: Once | INTRAMUSCULAR | Status: AC
  Administered 2016-05-16: 5 10*6.[IU] via INTRAVENOUS
  Filled 2016-05-16: qty 5

## 2016-05-16 MED ORDER — PRENATAL MULTIVITAMIN CH
1.0000 | ORAL_TABLET | Freq: Every day | ORAL | Status: DC
  Administered 2016-05-17 – 2016-05-18 (×2): 1 via ORAL
  Filled 2016-05-16 (×2): qty 1

## 2016-05-16 MED ORDER — PENICILLIN G POT IN DEXTROSE 60000 UNIT/ML IV SOLN
3.0000 10*6.[IU] | INTRAVENOUS | Status: DC
  Administered 2016-05-16 (×2): 3 10*6.[IU] via INTRAVENOUS
  Filled 2016-05-16 (×5): qty 50

## 2016-05-16 MED ORDER — DIBUCAINE 1 % RE OINT: 1.0000 "application " | TOPICAL_OINTMENT | RECTAL | Status: DC | PRN

## 2016-05-16 MED ORDER — ZOLPIDEM TARTRATE 5 MG PO TABS: 5.0000 mg | ORAL_TABLET | Freq: Every evening | ORAL | Status: DC | PRN

## 2016-05-16 MED ORDER — LACTATED RINGERS IV SOLN: 500.0000 mL | INTRAVENOUS | Status: DC | PRN

## 2016-05-16 MED ORDER — OXYTOCIN 40 UNITS IN LACTATED RINGERS INFUSION - SIMPLE MED
1.0000 m[IU]/min | INTRAVENOUS | Status: DC
  Administered 2016-05-16: 2 m[IU]/min via INTRAVENOUS

## 2016-05-16 MED ORDER — DIPHENHYDRAMINE HCL 50 MG/ML IJ SOLN: 12.5000 mg | INTRAMUSCULAR | Status: DC | PRN

## 2016-05-16 MED ORDER — SIMETHICONE 80 MG PO CHEW: 80.0000 mg | CHEWABLE_TABLET | ORAL | Status: DC | PRN

## 2016-05-16 MED ORDER — ONDANSETRON HCL 4 MG/2ML IJ SOLN: 4.0000 mg | INTRAMUSCULAR | Status: DC | PRN

## 2016-05-16 MED ORDER — FENTANYL 2.5 MCG/ML BUPIVACAINE 1/10 % EPIDURAL INFUSION (WH - ANES): 14.0000 mL/h | INTRAMUSCULAR | Status: DC | PRN

## 2016-05-16 MED ORDER — PHENYLEPHRINE 40 MCG/ML (10ML) SYRINGE FOR IV PUSH (FOR BLOOD PRESSURE SUPPORT)
80.0000 ug | PREFILLED_SYRINGE | INTRAVENOUS | Status: DC | PRN
  Filled 2016-05-16: qty 5

## 2016-05-16 MED ORDER — SENNOSIDES-DOCUSATE SODIUM 8.6-50 MG PO TABS
2.0000 | ORAL_TABLET | ORAL | Status: DC
  Administered 2016-05-16 – 2016-05-18 (×2): 2 via ORAL
  Filled 2016-05-16 (×2): qty 2

## 2016-05-16 MED ORDER — TERBUTALINE SULFATE 1 MG/ML IJ SOLN
0.2500 mg | Freq: Once | INTRAMUSCULAR | Status: DC | PRN
  Filled 2016-05-16: qty 1

## 2016-05-16 MED ORDER — LIDOCAINE HCL (PF) 1 % IJ SOLN
30.0000 mL | INTRAMUSCULAR | Status: AC | PRN
  Administered 2016-05-16: 30 mL via SUBCUTANEOUS
  Filled 2016-05-16: qty 30

## 2016-05-16 MED ORDER — TETANUS-DIPHTH-ACELL PERTUSSIS 5-2.5-18.5 LF-MCG/0.5 IM SUSP: 0.5000 mL | Freq: Once | INTRAMUSCULAR | Status: DC

## 2016-05-16 MED ORDER — ONDANSETRON HCL 4 MG/2ML IJ SOLN: 4.0000 mg | Freq: Four times a day (QID) | INTRAMUSCULAR | Status: DC | PRN

## 2016-05-16 MED ORDER — FENTANYL CITRATE (PF) 100 MCG/2ML IJ SOLN: 100.0000 ug | INTRAMUSCULAR | Status: DC | PRN

## 2016-05-16 NOTE — Anesthesia Preprocedure Evaluation (Signed)
Anesthesia Evaluation  Patient identified by MRN, date of birth, ID band Patient awake    Reviewed: Allergy & Precautions, NPO status , Patient's Chart, lab work & pertinent test results  Airway Mallampati: II  TM Distance: >3 FB Neck ROM: Full    Dental no notable dental hx.    Pulmonary former smoker,    Pulmonary exam normal breath sounds clear to auscultation       Cardiovascular negative cardio ROS Normal cardiovascular exam Rhythm:Regular Rate:Normal     Neuro/Psych PSYCHIATRIC DISORDERS Anxiety negative neurological ROS     GI/Hepatic negative GI ROS, Neg liver ROS,   Endo/Other  negative endocrine ROS  Renal/GU negative Renal ROS     Musculoskeletal negative musculoskeletal ROS (+)   Abdominal   Peds  Hematology negative hematology ROS (+)   Anesthesia Other Findings   Reproductive/Obstetrics (+) Pregnancy                             Anesthesia Physical Anesthesia Plan  ASA: II  Anesthesia Plan: Epidural   Post-op Pain Management:    Induction:   Airway Management Planned:   Additional Equipment:   Intra-op Plan:   Post-operative Plan:   Informed Consent: I have reviewed the patients History and Physical, chart, labs and discussed the procedure including the risks, benefits and alternatives for the proposed anesthesia with the patient or authorized representative who has indicated his/her understanding and acceptance.     Plan Discussed with:   Anesthesia Plan Comments:         Anesthesia Quick Evaluation

## 2016-05-16 NOTE — Anesthesia Procedure Notes (Signed)
Epidural Patient location during procedure: OB Start time: 05/16/2016 9:15 AM  Staffing Anesthesiologist: Lewie LoronGERMEROTH, Gabby Rackers Performed: anesthesiologist   Preanesthetic Checklist Completed: patient identified, pre-op evaluation, timeout performed, IV checked, risks and benefits discussed and monitors and equipment checked  Epidural Patient position: sitting Prep: site prepped and draped and DuraPrep Patient monitoring: heart rate Approach: midline Location: L2-L3 Injection technique: LOR air and LOR saline  Needle:  Needle type: Tuohy  Needle gauge: 17 G Needle length: 9 cm Needle insertion depth: 8 cm Catheter type: closed end flexible Catheter size: 19 Gauge Catheter at skin depth: 14 cm Test dose: negative  Assessment Sensory level: T8 Events: blood not aspirated, injection not painful, no injection resistance, negative IV test and no paresthesia  Additional Notes Reason for block:procedure for pain

## 2016-05-16 NOTE — Progress Notes (Signed)
Samantha PonsRachel Sandoval is a 21 y.o. G1P0 at 5518w1d by LMP admitted for active labor, rupture of membranes  Subjective: Pt comfortable after epidural.   Objective: BP 113/66   Pulse 88   Temp 97.8 F (36.6 C) (Oral)   Resp 18   Ht 5\' 3"  (1.6 m)   Wt 93.9 kg (207 lb)   LMP 08/09/2015   SpO2 99%   BMI 36.67 kg/m  No intake/output data recorded. No intake/output data recorded.  FHT:  FHR: 140 basline bpm, variability: moderate,  accelerations:  Present,  decelerations:  Absent UC:   regular, every 2-3 minutes SVE:   Dilation: 4.5 Effacement (%): 80 Station: -1, Ballotable Exam by:: Samantha FarmerGail OwensbyRN  Labs: Lab Results  Component Value Date   WBC 12.2 (H) 05/16/2016   HGB 12.1 05/16/2016   HCT 36.8 05/16/2016   MCV 80.5 05/16/2016   PLT 225 05/16/2016    Assessment / Plan: Spontaneous labor, progressing normally on pitocin.   Labor: Progressing normally Preeclampsia:  none  Fetal Wellbeing:  Category I Pain Control:  Epidural I/D:  n/a Anticipated MOD:  NSVD  Samantha PopperJames Sandoval 05/16/2016, 10:02 AM  OB FELLOW MEDICAL STUDENT NOTE ATTESTATION  I have seen and examined this patient. Note this is a Psychologist, occupationalmedical student note and as such does not necessarily reflect the patient's plan of care. Please see progress note for this date of service.    Samantha MowElizabeth Jadda Hunsucker, DO OB Fellow

## 2016-05-16 NOTE — Progress Notes (Addendum)
Comfortable with epidural  BP 119/77   Pulse (!) 118   Temp 97.8 F (36.6 C) (Oral)   Resp 18   Ht 5\' 3"  (1.6 m)   Wt 207 lb (93.9 kg)   LMP 08/09/2015   SpO2 100%   BMI 36.67 kg/m  Fetal tracing: Cat I AROM @1100 , moderate amount of clear liquid.  SVE after: no cord prolapsed, 9.5 (anterior lip)/100/0, vertex, head well placed  P expect spontaneous vaginal delivery  Nehemiah SettleAna Carvalho do Silvio ClaymanAmaral MD PGY1

## 2016-05-16 NOTE — Anesthesia Pain Management Evaluation Note (Signed)
  CRNA Pain Management Visit Note  Patient: Samantha Sandoval, 21 y.o., female  "Hello I am a member of the anesthesia team at [facility]. We have an anesthesia team available at all times to provide care throughout the hospital, including epidural management and anesthesia for C-section. I don't know your plan for the delivery whether it a natural birth, water birth, IV sedation, nitrous supplementation, doula or epidural, but we want to meet your pain goals."   1.Was your pain managed to your expectations on prior hospitalizations?   No prior hospitalizations  2.What is your expectation for pain management during this hospitalization?     Labor support without medications  3.How can we help you reach that goal?   Record the patient's initial score and the patient's pain goal.   Pain: 10  Pain Goal: 1 [facility] wants you to be able to say your pain was always managed very well.  Samantha Sandoval,Samantha Sandoval 05/16/2016

## 2016-05-16 NOTE — H&P (Signed)
LABOR AND DELIVERY ADMISSION HISTORY AND PHYSICAL NOTE  Samantha Sandoval is a 21 y.o. female G1P0 with IUP at [redacted]w[redacted]d by LMP who presents to Maternity Admissions reporting lower abdominal contractions and concern for SROM. Noticed 2 small spots of blood after sensation of wetness while sitting today at home. Went to bathroom and changed wet pad prompting visit to MAU. Contractions felt like they were every 4-6 mins. Endorses good fetal movement. Denies h/o medial or prenatal complications. Denies trauma, fevers or chills, n/v, h/a, change in vision, LE edema, CP, SOB, dysuria. Fern test positive on admission.  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 Anxiety     Past Surgical History: Past Surgical History:  Procedure Laterality Date  . ABDOMINAL SURGERY    . tubes in ears    . tumor 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      Obstetrical History: OB History    Gravida Para Term Preterm AB Living   1             SAB TAB Ectopic Multiple Live Births                  Social History: Social History   Social History  . Marital status: Married    Spouse name: N/A  . Number of children: N/A  . Years of education: N/A   Social History Main Topics  . Smoking status: Former Smoker    Types: Cigarettes  . Smokeless tobacco: Never Used  . Alcohol use No     Comment: none  . Drug use: No     Comment: denies  . Sexual activity: Yes    Birth control/ protection: None   Other Topics Concern  . None   Social History Narrative  . None    Family History: Family History  Problem Relation Age of Onset  . Diabetes Maternal Grandmother   . Hypertension Maternal Grandmother   . COPD Maternal Grandmother   . Scoliosis Maternal Grandmother   . Heart disease Maternal Grandmother   . Hypertension Maternal  Grandfather   . Other Maternal Grandfather     2 stents in heart  . Heart attack Paternal Grandfather   . Congestive Heart Failure Neg Hx     Allergies: No Known Allergies  Prescriptions Prior to Admission  Medication Sig Dispense Refill Last Dose  . metroNIDAZOLE (FLAGYL) 500 MG tablet Take 1 tablet (500 mg total) by mouth 2 (two) times daily. 14 tablet 0      Review of Systems   All systems reviewed and negative except as stated in HPI  Blood pressure 103/57, pulse 87, temperature 98.3 F (36.8 C), temperature source Oral, resp. rate 18, last menstrual period 08/09/2015, SpO2 99 %. General appearance: alert, cooperative and no distress Lungs: clear to auscultation bilaterally Heart: regular rate and rhythm Abdomen: soft, non-tender; bowel sounds normal Extremities: No calf swelling or tenderness Presentation: cephalic Fetal monitoring: FHR 140 bpm, moderate variability, accelerations present, no decelerations Uterine activity: irregular q4-6 mins Dilation: 3 Effacement (%): 80 Station: -2 Exam by:: A. Stovall RN   Prenatal labs: ABO, Rh: O/Positive/-- (08/02 1632) Antibody: Negative (12/13 0941) Rubella: Immune RPR: Non Reactive (12/13 0941)  HBsAg: Negative (08/02 1632)  HIV: Non Reactive (12/13 0941)  GBS: Positive (02/20 1500)  2 hr Glucola: Normal (77/153/114) Genetic screening:  Normal Anatomy US:  Normal  Prenatal Transfer Tool  Maternal Diabetes: No Genetic Screening: Normal Maternal Ultrasounds/Referrals: Normal Fetal Ultrasounds or other Referrals:  None Maternal Substance Abuse:  No Significant Maternal Medications:  None Significant Maternal Lab Results: Lab values include: Group B Strep positive  No results found for this or any previous visit (from the past 24 hour(s)).  Patient Active Problem List   Diagnosis Date Noted  . 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 Supervision of normal  first pregnancy 10/06/2015  . Family history of ovarian cancer 10/06/2015    Assessment: Samantha Sandoval is a 21 y.o. G1P0 at 130w1d here for SOL after SROM.  #Labor:SOL #Pain: No pain medications at this time #FWB: Category I #ID:  GBS positive on PCN #MOF: Breast #MOC:Condoms #Circ:  Yes outpatient  Wendee Beaversavid J McMullen, DO, PGY-1 05/16/2016, 2:28 AM   I was present for the exam and agree with above. Fern slide positive.   [place] Liliah Dorian, CNM 05/16/2016 2:57 AM

## 2016-05-16 NOTE — MAU Note (Signed)
Pt presents complaining of contractions since 1900. States she had some leaking and then bleeding that filled a panty liner. No bleeding or leaking since. Reports good fetal movement.

## 2016-05-16 NOTE — Progress Notes (Signed)
Patient ID: Samantha Sandoval, female   DOB: 07/19/1995, 21 y.o.   MRN: [id number] Samantha Sandoval is a 21 y.o. G1P0 at 721w1d.  Subjective: Increasing discomfort w/ contractions. Coping well.   Objective: BP 113/75 (BP Location: Left Arm)   Pulse 87   Temp 98.4 F (36.9 C) (Oral)   Resp 18   Ht 5\' 3"  (1.6 m)   Wt 207 lb (93.9 kg)   LMP 08/09/2015   SpO2 99%   BMI 36.67 kg/m    FHT:  FHR: 135 bpm, variability: mod,  accelerations:  15x15,  decelerations:  none UC:   Q 3-6 minutes, moderate Dilation: 3 Effacement (%): 80 Station: -2 Presentation: Vertex Exam by:: Corbin AdeVirgnia Akiem Urieta CNM   Labs: Results for orders placed or performed during the hospital encounter of 05/16/16 (from the past 24 hour(s))  POCT fern test     Status: Abnormal   Collection Time: 05/16/16  2:00 AM  Result Value Ref Range   POCT Fern Test Positive = ruptured amniotic membanes   CBC     Status: Abnormal   Collection Time: 05/16/16  3:16 AM  Result Value Ref Range   WBC 12.2 (H) 4.0 - 10.5 K/uL   RBC 4.57 3.87 - 5.11 MIL/uL   Hemoglobin 12.1 12.0 - 15.0 g/dL   HCT 82.936.8 56.236.0 - 13.046.0 %   MCV 80.5 78.0 - 100.0 fL   MCH 26.5 26.0 - 34.0 pg   MCHC 32.9 30.0 - 36.0 g/dL   RDW 86.514.6 78.411.5 - 69.615.5 %   Platelets 225 150 - 400 K/uL    Assessment / Plan: 741w1d week IUP Labor: Early, ROM x 10 hours w/out cervical change Fetal Wellbeing:  Category I Pain Control:  None Anticipated MOD:  SVD Start pitocin  [place] Edsel Shives, CNM 05/16/2016 5:02 AM

## 2016-05-16 NOTE — Progress Notes (Signed)
20yo G1P0 at 365w1d SOL, SROM at 1900  S: More comfortable with epidural  O: BP (!) 102/57   Pulse 91   Temp 98.3 F (36.8 C) (Oral)   Resp 20   Ht 5\' 3"  (1.6 m)   Wt 207 lb (93.9 kg)   LMP 08/09/2015   SpO2 99%   BMI 36.67 kg/m  Fetal monitoring: baseline 140, moderate variability, + acels, no decels Regular contractions SVE: 4-5/80/-1  A: SOL, progressing  P: expect vaginal delivery  Adair LaundryAna Carvalho do [place]ar[place], MD PGY1

## 2016-05-17 LAB — RPR: RPR Ser Ql: NONREACTIVE

## 2016-05-17 NOTE — Progress Notes (Signed)
MOB was referred for history of depression/anxiety. * Referral screened out by Clinical Social Worker because none of the following criteria appear to apply: ~ History of anxiety/depression during this pregnancy, or of post-partum depression. ~ Diagnosis of anxiety and/or depression within last 3 years OR * MOB's symptoms currently being treated with medication and/or therapy.  CSW complete chart review and no concerns noted during prenatal care.   Please contact the Clinical Social Worker if needs arise, or if MOB requests.  Stellarose Cerny Boyd-Gilyard, MSW, LCSW Clinical Social Work [phone] 

## 2016-05-17 NOTE — Anesthesia Postprocedure Evaluation (Signed)
Anesthesia Post Note  Patient: Samantha Sandoval  Procedure(s) Performed: * No procedures listed *  Patient location during evaluation: Mother Baby Anesthesia Type: Epidural Level of consciousness: awake and alert Pain management: satisfactory to patient Vital Signs Assessment: post-procedure vital signs reviewed and stable Respiratory status: respiratory function stable Cardiovascular status: stable Postop Assessment: no headache, no backache, epidural receding, patient able to bend at knees, no signs of nausea or vomiting and adequate PO intake Anesthetic complications: no        Last Vitals:  Vitals:   05/16/16 2100 05/17/16 0511  BP: (!) 106/49 106/65  Pulse: 85 94  Resp: 18 18  Temp: 36.9 C 36.7 C    Last Pain:  Vitals:   05/17/16 0924  TempSrc:   PainSc: 0-No pain   Pain Goal:                 [name]

## 2016-05-17 NOTE — Lactation Note (Signed)
This note was copied from a baby's chart. Lactation Consultation Note: Lactation Brochure given to mother. Informed of all services. Mother has had difficulty with latching infant. She reports that she is now bottle feeding. Mother was offered support with bottle feeding. Encour[facility] mother to [facility] needed is she changes her mind.   Patient Name: Samantha Sandoval YNWGN'FToday's Date: 05/17/2016 Reason for consult: Initial assessment   Maternal Data    Feeding    LATCH Score/Interventions                      Lactation Tools Discussed/Used     Consult Status Consult Status: Complete    Michel BickersKendrick, Angeliah Wisdom McCoy 05/17/2016, 2:45 PM

## 2016-05-17 NOTE — Progress Notes (Signed)
Post Partum Day#1 Subjective: no complaints, up ad lib, voiding and tolerating PO  Objective: Blood pressure 106/65, pulse 94, temperature 98 F (36.7 C), resp. rate 18, height 5\' 3"  (1.6 m), weight 93.9 kg (207 lb), last menstrual period 08/09/2015, SpO2 100 %, unknown if currently breastfeeding.  Physical Exam:  General: alert Lochia: appropriate Uterine Fundus: firm and NT at U-1 DVT Evaluation: No evidence of DVT seen on physical exam.   Recent Labs  05/16/16 0316  HGB 12.1  HCT 36.8    Assessment/Plan: Plan for discharge tomorrow   LOS: 1 day   Samantha Sandoval C Jeannie Mallinger 05/17/2016, 7:20 AM

## 2016-05-18 ENCOUNTER — Encounter: Payer: Medicaid Other | Admitting: Women's Health

## 2016-05-18 MED ORDER — IBUPROFEN 600 MG PO TABS: 600.0000 mg | ORAL_TABLET | Freq: Four times a day (QID) | ORAL | 0 refills | Status: DC

## 2016-05-18 MED ORDER — NORETHIN ACE-ETH ESTRAD-FE 1.5-30 MG-MCG PO TABS: 1.0000 | ORAL_TABLET | Freq: Every day | ORAL | 11 refills | Status: DC

## 2016-05-18 MED ORDER — SENNOSIDES-DOCUSATE SODIUM 8.6-50 MG PO TABS: 2.0000 | ORAL_TABLET | ORAL | 1 refills | Status: DC

## 2016-05-18 NOTE — Discharge Instructions (Signed)
Home Care Instructions for Mom ° ACTIVITY °· Gradually return to your regular activities. °· Let yourself rest. Nap while your baby sleeps. °· Avoid lifting anything that is heavier than 10 lb (4.5 kg) until your health care provider says it is okay. °· Avoid activities that take a lot of effort and energy (are strenuous) until approved by your health care provider. Walking at a slow-to-moderate pace is usually safe. °· If you had a cesarean delivery: °¨ Do not vacuum, climb stairs, or drive a car for 4-6 weeks. °¨ Have someone help you at home until you feel like you can do your usual activities yourself. °¨ Do exercises as told by your health care provider, if this applies. °VAGINAL BLEEDING °You may continue to bleed for 4-6 weeks after delivery. Over time, the amount of blood usually decreases and the color of the blood usually gets lighter. However, the flow of bright red blood may increase if you have been too active. If you need to use more than one pad in an hour because your pad gets soaked, or if you pass a large clot: °· Lie down. °· Raise your feet. °· Place a cold compress on your lower abdomen. °· Rest. °· Call your health care provider. °If you are breastfeeding, your period should return anytime between 8 weeks after delivery and the time that you stop breastfeeding. If you are not breastfeeding, your period should return 6-8 weeks after delivery. °PERINEAL CARE °The perineal area, or perineum, is the part of your body between your thighs. After delivery, this area needs special care. Follow these instructions as told by your health care provider. °· Take warm tub baths for 15-20 minutes. °· Use medicated pads and pain-relieving sprays and creams as told. °· Do not use tampons or douches until vaginal bleeding has stopped. °· Each time you go to the bathroom: °¨ Use a peri bottle. °¨ Change your pad. °¨ Use towelettes in place of toilet paper until your stitches have healed. °· Do Kegel exercises  every day. Kegel exercises help to maintain the muscles that support the vagina, bladder, and bowels. You can do these exercises while you are standing, sitting, or lying down. To do Kegel exercises: °¨ Tighten the muscles of your abdomen and the muscles that surround your birth canal. °¨ Hold for a few seconds. °¨ Relax. °¨ Repeat until you have done this 5 times in a row. °· To prevent hemorrhoids from developing or getting worse: °¨ Drink enough fluid to keep your urine clear or pale yellow. °¨ Avoid straining when having a bowel movement. °¨ Take over-the-counter medicines and stool softeners as told by your health care provider. °BREAST CARE °· Wear a tight-fitting bra. °· Avoid taking over-the-counter pain medicine for breast discomfort. °· Apply ice to the breasts to help with discomfort as needed: °¨ Put ice in a plastic bag. °¨ Place a towel between your skin and the bag. °¨ Leave the ice on for 20 minutes or as told by your health care provider. °NUTRITION °· Eat a well-balanced diet. °· Do not try to lose weight quickly by cutting back on calories. °· Take your prenatal vitamins until your postpartum checkup or until your health care provider tells you to stop. °POSTPARTUM DEPRESSION °You may find yourself crying for no apparent reason and unable to cope with all of the changes that come with having a newborn. This mood is called postpartum depression. Postpartum depression happens because your hormone levels change after delivery.   If you have postpartum depression, get support from your partner, friends, and family. If the depression does not go away on its own after several weeks, contact your health care provider. BREAST SELF-EXAM Do a breast self-exam each month, at the same time of the month. If you are breastfeeding, check your breasts just after a feeding, when your breasts are less full. If you are breastfeeding and your period has started, check your breasts on day 5, 6, or 7 of your  period. Report any lumps, bumps, or discharge to your health care provider. Know that breasts are normally lumpy if you are breastfeeding. This is temporary, and it is not a health risk. INTIMACY AND SEXUALITY Avoid sexual activity for at least 3-4 weeks after delivery or until the brownish-red vaginal flow is completely gone. If you want to avoid pregnancy, use some form of birth control. You can get pregnant after delivery, even if you have not had your period. SEEK MEDICAL CARE IF:  You feel unable to cope with the changes that a child brings to your life, and these feelings do not go away after several weeks.  You notice a lump, a bump, or discharge on your breast. SEEK IMMEDIATE MEDICAL CARE IF:  Blood soaks your pad in 1 hour or less.  You have:  Severe pain or cramping in your lower abdomen.  A bad-smelling vaginal discharge.  A fever that is not controlled by medicine.  A fever, and an area of your breast is red and sore.  Pain or redness in your calf.  Sudden, severe chest pain.  Shortness of breath.  Painful or bloody urination.  Problems with your vision.  You vomit for 12 hours or longer.  You develop a severe headache.  You have serious thoughts about hurting yourself, your child, or anyone else. This information is not intended to replace advice given to you by your health care provider. Make sure you discuss any questions you have with your health care provider. Document Released: 02/18/2000 Document Revised: 07/29/2015 Document Reviewed: 08/24/2014 Elsevier Interactive Patient Education  2017 Elsevier Inc. Ball CorporationBraxton Hicks Contractions Contractions of the uterus can occur throughout pregnancy, but they are not always a sign that you are in labor. You may have practice contractions called Braxton Hicks contractions. These false labor contractions are sometimes confused with true labor. What are Deberah PeltonBraxton Hicks contractions? Braxton Hicks contractions are  tightening movements that occur in the muscles of the uterus before labor. Unlike true labor contractions, these contractions do not result in opening (dilation) and thinning of the cervix. Toward the end of pregnancy (32-34 weeks), Braxton Hicks contractions can happen more often and may become stronger. These contractions are sometimes difficult to tell apart from true labor because they can be very uncomfortable. You should not feel embarrassed if you go to the hospital with false labor. Sometimes, the only way to tell if you are in true labor is for your health care provider to look for changes in the cervix. The health care provider will do a physical exam and may monitor your contractions. If you are not in true labor, the exam should show that your cervix is not dilating and your water has not broken. If there are no prenatal problems or other health problems associated with your pregnancy, it is completely safe for you to be sent home with false labor. You may continue to have Braxton Hicks contractions until you go into true labor. How can I tell the difference between true  labor and false labor?  Differences  False labor  Contractions last 30-70 seconds.: Contractions are usually shorter and not as strong as true labor contractions.  Contractions become very regular.: Contractions are usually irregular.  Discomfort is usually felt in the top of the uterus, and it spreads to the lower abdomen and low back.: Contractions are often felt in the front of the lower abdomen and in the groin.  Contractions do not go away with walking.: Contractions may go away when you walk around or change positions while lying down.  Contractions usually become more intense and increase in frequency.: Contractions get weaker and are shorter-lasting as time goes on.  The cervix dilates and gets thinner.: The cervix usually does not dilate or become thin. Follow these instructions at home:  Take  over-the-counter and prescription medicines only as told by your health care provider.  Keep up with your usual exercises and follow other instructions from your health care provider.  Eat and drink lightly if you think you are going into labor.  If Braxton Hicks contractions are making you uncomfortable:  Change your position from lying down or resting to walking, or change from walking to resting.  Sit and rest in a tub of warm water.  Drink enough fluid to keep your urine clear or pale yellow. Dehydration may cause these contractions.  Do slow and deep breathing several times an hour.  Keep all follow-up prenatal visits as told by your health care provider. This is important. Contact a health care provider if:  You have a fever.  You have continuous pain in your abdomen. Get help right away if:  Your contractions become stronger, more regular, and closer together.  You have fluid leaking or gushing from your vagina.  You pass blood-tinged mucus (bloody show).  You have bleeding from your vagina.  You have low back pain that you never had before.  You feel your babys head pushing down and causing pelvic pressure.  Your baby is not moving inside you as much as it used to. Summary  Contractions that occur before labor are called Braxton Hicks contractions, false labor, or practice contractions.  Braxton Hicks contractions are usually shorter, weaker, farther apart, and less regular than true labor contractions. True labor contractions usually become progressively stronger and regular and they become more frequent.  Manage discomfort from [facility] contractions by changing position, resting in a warm bath, drinking plenty of water, or practicing deep breathing. This information is not intended to replace advice given to you by your health care provider. Make sure you discuss any questions you have with your health care provider. Document Released: 02/20/2005 Document  Revised: 01/10/2016 Document Reviewed: 01/10/2016 Elsevier Interactive Patient Education  2017 [employer].

## 2016-05-18 NOTE — Discharge Summary (Signed)
Obstetric Discharge Summary Reason for Admission: 40+ weeks EGA with SOL and SROM Prenatal Procedures: ultrasound Intrapartum Procedures: Pitocin augmentation and NSVD Postpartum Procedures: none Complications-Operative and Postpartum: 1st degree labial laceration degree perineal laceration Hemoglobin  Date Value Ref Range Status  05/16/2016 12.1 12.0 - 15.0 g/dL Final   HCT  Date Value Ref Range Status  05/16/2016 36.8 36.0 - 46.0 % Final   Hematocrit  Date Value Ref Range Status  02/16/2016 37.9 34.0 - 46.6 % Final    Physical Exam:  General: alert Lochia: appropriate Uterine Fundus: firm and NT at U-2 DVT Evaluation: No evidence of DVT seen on physical exam.  Discharge Diagnoses: Term Pregnancy-delivered  Discharge Information: Date: 05/18/2016 Activity: pelvic rest Diet: routine Medications: PNV, Ibuprofen and Lo Estrin to be started in 2 weeks Condition: stable Instructions: refer to practice specific booklet Discharge to: home Follow-up Information    Samantha Sandoval, Samantha Sandoval, CNM. Go on 05/18/2016.   Specialty:  Obstetrics and Gynecology Why:  Go to appt at 12:00 PM. Contact information: [address] [name], 9  Home with mother.  Samantha Sandoval 05/18/2016, 7:48 AM

## 2016-05-19 ENCOUNTER — Telehealth: Payer: Self-pay | Admitting: *Deleted

## 2016-05-19 NOTE — Telephone Encounter (Signed)
FYI: [facility] called to report Hemoglobin 10.7.  Called patient to inform her to continue taking PNV along with increasing her iron rich foods such as green leafy vegetables, beans, red meat. Pt verbalized understanding.

## 2016-06-01 ENCOUNTER — Encounter: Payer: Self-pay | Admitting: Advanced Practice Midwife

## 2016-06-01 ENCOUNTER — Inpatient Hospital Stay (HOSPITAL_COMMUNITY)
Admission: AD | Admit: 2016-06-01 | Discharge: 2016-06-02 | Disposition: A | Discharge status: Home / Self Care | Payer: Medicaid Other | Source: Ambulatory Visit | Attending: Obstetrics and Gynecology | Admitting: Obstetrics and Gynecology

## 2016-06-01 DIAGNOSIS — Z87891 Personal history of nicotine dependence: Secondary | ICD-10-CM | POA: Insufficient documentation

## 2016-06-01 DIAGNOSIS — N9089 Other specified noninflammatory disorders of vulva and perineum: Secondary | ICD-10-CM

## 2016-06-01 DIAGNOSIS — N909 Noninflammatory disorder of vulva and perineum, unspecified: Secondary | ICD-10-CM | POA: Insufficient documentation

## 2016-06-02 DIAGNOSIS — N898 Other specified noninflammatory disorders of vagina: Secondary | ICD-10-CM | POA: Diagnosis present

## 2016-06-02 DIAGNOSIS — N9089 Other specified noninflammatory disorders of vulva and perineum: Secondary | ICD-10-CM | POA: Diagnosis not present

## 2016-06-02 DIAGNOSIS — N909 Noninflammatory disorder of vulva and perineum, unspecified: Secondary | ICD-10-CM | POA: Diagnosis not present

## 2016-06-02 DIAGNOSIS — Z87891 Personal history of nicotine dependence: Secondary | ICD-10-CM | POA: Diagnosis not present

## 2016-06-02 LAB — URINALYSIS, ROUTINE W REFLEX MICROSCOPIC
Bilirubin Urine: NEGATIVE
Glucose, UA: NEGATIVE mg/dL
Ketones, ur: NEGATIVE mg/dL
Nitrite: NEGATIVE
Protein, ur: NEGATIVE mg/dL
Specific Gravity, Urine: 1.012 (ref 1.005–1.030)
pH: 5 (ref 5.0–8.0)

## 2016-06-02 LAB — WET PREP, GENITAL
Sperm: NONE SEEN
Trich, Wet Prep: NONE SEEN
Yeast Wet Prep HPF POC: NONE SEEN

## 2016-06-02 LAB — GC/CHLAMYDIA PROBE AMP (~~LOC~~) NOT AT ARMC
Chlamydia: NEGATIVE
Neisseria Gonorrhea: NEGATIVE

## 2016-06-02 NOTE — Discharge Instructions (Signed)

## 2016-06-02 NOTE — MAU Provider Note (Signed)
History     CSN: [id number]  Arrival date and time: 06/01/16 2314   First Provider Initiated Contact with Patient 06/02/16 [phone]      Chief Complaint  Patient presents with  . Vaginal Discharge   Samantha Sandoval is a 21 y.o. G1P1001 who is SP NSVD on 05/16/16. She is here today wit discharge and itching x 1 day. She reports that her bleeding has stopped and now she has a brown discharge. She is also having a lot of itching. She did have sutures after delivery. She also has some occasional pressure when she voids. She denies any fever or pain at this time.    Vaginal Discharge  The patient's primary symptoms include genital itching and vaginal discharge. The patient's pertinent negatives include no pelvic pain. This is a new problem. The current episode started today. The problem occurs constantly. The problem has been unchanged. The patient is experiencing no pain. She is not pregnant (postpartum ). Associated symptoms include dysuria. Pertinent negatives include no chills, fever, nausea or vomiting. The vaginal discharge was brown. There has been no bleeding. She has not been passing clots. She has not been passing tissue. Nothing aggravates the symptoms. She has tried nothing for the symptoms.   Past Medical History:  Diagnosis Date  . Anxiety     Past Surgical History:  Procedure Laterality Date  . ABDOMINAL SURGERY    . tubes in ears    . 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 Diabetes Maternal Grandmother   . Hypertension Maternal Grandmother   . COPD Maternal Grandmother   . Scoliosis Maternal Grandmother   . Heart disease Maternal Grandmother   . Hypertension Maternal Grandfather   . Other Maternal Grandfather     2 stents in heart  . Heart attack Paternal Grandfather   . Congestive Heart Failure Neg Hx     Social History  Substance Use Topics  . Smoking status: Former Smoker    Types: Cigarettes  . Smokeless tobacco: Never  Used  . Alcohol use No     Comment: none    Allergies: No Known Allergies  Prescriptions Prior to Admission  Medication Sig Dispense Refill Last Dose  . ibuprofen (ADVIL,MOTRIN) 600 MG tablet Take 1 tablet (600 mg total) by mouth every 6 (six) hours. 30 tablet 0   . norethindrone-ethinyl estradiol-iron (MICROGESTIN FE,GILDESS FE,LOESTRIN FE) 1.5-30 MG-MCG tablet Take 1 tablet by mouth daily. Start taking the pills daily when the baby is 52 weeks old. Recommend abstinence until after your 6 week postpartum visit. 1 Package 11   . senna-docusate (SENOKOT-S) 8.6-50 MG tablet Take 2 tablets by mouth daily. 100 tablet 1     Review of Systems  Constitutional: Negative for chills and fever.  Gastrointestinal: Negative for nausea and vomiting.  Genitourinary: Positive for dysuria and vaginal discharge. Negative for pelvic pain, vaginal bleeding and vaginal pain.   Physical Exam   unknown if currently breastfeeding.  Physical Exam  Nursing note and vitals reviewed. Constitutional: She is oriented to person, place, and time. She appears well-developed and well-nourished. No distress.  HENT:  Head: Normocephalic.  Cardiovascular: Normal rate.   Respiratory: Effort normal.  GI: Soft. There is no tenderness. There is no rebound.  Genitourinary:  Genitourinary Comments: Patient points to an area where there is still some suture on the right labia. She reports that this is the area that is itching.  Small amount  of normal appearing brown lochia Perineal sutures intact and healing well.   Neurological: She is alert and oriented to person, place, and time.  Skin: Skin is warm and dry.    MAU Course  Procedures  MDM   Assessment and Plan   1. Irritation of vulva    DC home Comfort measures reviewed  RX: none  Return to MAU as needed FU with OB as planned  Follow-up Information    [facility].   Specialty:  Obstetrics and Gynecology Contact  information: [address] [place] [address] [phone]           Tawnya Crook 06/02/2016, 12:36 AM

## 2016-06-03 LAB — URINE CULTURE

## 2016-06-05 ENCOUNTER — Telehealth: Payer: Self-pay | Admitting: *Deleted

## 2016-06-05 NOTE — Telephone Encounter (Signed)
Patient was seen on 06/01/16 at [facility]. DC home.

## 2016-06-11 ENCOUNTER — Encounter (HOSPITAL_COMMUNITY): Payer: Self-pay | Admitting: Emergency Medicine

## 2016-06-11 ENCOUNTER — Emergency Department (HOSPITAL_COMMUNITY)
Admission: EM | Admit: 2016-06-11 | Discharge: 2016-06-11 | Disposition: A | Discharge status: Home / Self Care | Payer: Medicaid Other | Attending: Emergency Medicine | Admitting: Emergency Medicine

## 2016-06-11 DIAGNOSIS — J111 Influenza due to unidentified influenza virus with other respiratory manifestations: Secondary | ICD-10-CM

## 2016-06-11 DIAGNOSIS — Z87891 Personal history of nicotine dependence: Secondary | ICD-10-CM | POA: Diagnosis not present

## 2016-06-11 DIAGNOSIS — R05 Cough: Secondary | ICD-10-CM | POA: Diagnosis present

## 2016-06-11 DIAGNOSIS — Z79899 Other long term (current) drug therapy: Secondary | ICD-10-CM | POA: Insufficient documentation

## 2016-06-11 MED ORDER — IBUPROFEN 800 MG PO TABS: 800.0000 mg | ORAL_TABLET | Freq: Three times a day (TID) | ORAL | 0 refills | Status: DC

## 2016-06-11 MED ORDER — KETOROLAC TROMETHAMINE 60 MG/2ML IM SOLN
60.0000 mg | Freq: Once | INTRAMUSCULAR | Status: AC
Start: 2016-06-11 — End: 2016-06-11
  Administered 2016-06-11: 60 mg via INTRAMUSCULAR
  Filled 2016-06-11: qty 2

## 2016-06-11 MED ORDER — BENZONATATE 100 MG PO CAPS: 100.0000 mg | ORAL_CAPSULE | Freq: Three times a day (TID) | ORAL | 0 refills | Status: DC

## 2016-06-11 NOTE — ED Triage Notes (Signed)
Patient c/o fever and cough that started yesterday. Per patient chills, body aches, sore throat, and fatigue. Denies any vomiting, diarrhea, or urinary symptoms. Per patient nonproductive cough. Husband recently had flu.

## 2016-06-11 NOTE — Discharge Instructions (Signed)
Motrin for fever / aches / headache and sore throat - 3 times daily as needed Tessalon every 8 hours for coughing as needed  Please obtain all of your results from medical records or have your doctors office obtain the results - share them with your doctor - you should be seen at your doctors office in the next 2 days. Call today to arrange your follow up. Take the medications as prescribed. Please review all of the medicines and only take them if you do not have an allergy to them. Please be aware that if you are taking birth control pills, taking other prescriptions, ESPECIALLY ANTIBIOTICS may make the birth control ineffective - if this is the case, either do not engage in sexual activity or use alternative methods of birth control such as condoms until you have finished the medicine and your family doctor says it is OK to restart them. If you are on a blood thinner such as COUMADIN, be aware that any other medicine that you take may cause the coumadin to either work too much, or not enough - you should have your coumadin level rechecked in next 7 days if this is the case.  ?  It is also a possibility that you have an allergic reaction to any of the medicines that you have been prescribed - Everybody reacts differently to medications and while MOST people have no trouble with most medicines, you may have a reaction such as nausea, vomiting, rash, swelling, shortness of breath. If this is the case, please stop taking the medicine immediately and contact your physician.  ?  You should return to the ER if you develop severe or worsening symptoms.

## 2016-06-11 NOTE — ED Provider Notes (Signed)
[facility]-EM[facility]Y DEPT Provider Note   CSN: [id number] Arrival date & time: 06/11/16  9528  By signing my name below, I, Samantha Sandoval, attest that this documentation has been prepared under the direction and in the presence of Eber Hong, MD.  Electronically Signed: Octavia Heir, ED Scribe. 06/11/16. 10:24 AM.   History   Chief Complaint Chief Complaint  Patient presents with  . Cough   The history is provided by the patient. No language interpreter was used.   HPI Comments: Samantha Sandoval is a 21 y.o. female who presents to the Emergency Department complaining of gradual worsening, moderate, non-productive cough x yesterday. She reports associated generalized myalgias, fever (tmax 100.4) chills, sore throat. Pt says she has been around her husband who was seen in the ED and tested positive for strep throat. She has not taken any OTC medication to alleviate her symptoms. She does not have any other symptoms or complaints.   Past Medical History:  Diagnosis Date  . Anxiety     Patient Active Problem List   Diagnosis Date Noted  . Leakage of amniotic fluid 05/16/2016  . 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 Supervision of normal first pregnancy 10/06/2015  . Family history of ovarian cancer 10/06/2015    Past Surgical History:  Procedure Laterality Date  . ABDOMINAL SURGERY    . tubes in ears    . tumor 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      OB History    Gravida Para Term Preterm AB Living   SAB TAB Ectopic Multiple Live Births         0 1       Home Medications    Prior to Admission medications   Medication Sig Start Date End Date Taking? Authorizing Provider  benzonatate (TESSALON) 100 MG capsule Take 1 capsule (100 mg total) by mouth every 8 (eight) hours. 06/11/16   Eber Hong, MD  ibuprofen (ADVIL,MOTRIN) 800 MG tablet Take 1 tablet (800 mg total) by mouth 3 (three) times daily. 06/11/16   Eber Hong, MD    norethindrone-ethinyl estradiol-iron (MICROGESTIN FE,GILDESS FE,LOESTRIN FE) 1.5-30 MG-MCG tablet Take 1 tablet by mouth daily. Start taking the pills daily when the baby is 89 weeks old. Recommend abstinence until after your 6 week postpartum visit. 05/18/16   Allie Bossier, MD  senna-docusate (SENOKOT-S) 8.6-50 MG tablet Take 2 tablets by mouth daily. 05/19/16   Allie Bossier, MD    Family History Family History  Problem Relation Age of Onset  . Diabetes Maternal Grandmother   . Hypertension Maternal Grandmother   . COPD Maternal Grandmother   . Scoliosis Maternal Grandmother   . Heart disease Maternal Grandmother   . Hypertension Maternal Grandfather   . Other Maternal Grandfather     2 stents in heart  . Heart attack Paternal Grandfather   . Congestive Heart Failure Neg Hx     Social History Social History  Substance Use Topics  . Smoking status: Former Smoker    Packs/day: 0.50    Types: Cigarettes  . Smokeless tobacco: Never Used  . Alcohol use No     Comment: none     Allergies   Patient has no known allergies.   Review of Systems Review of Systems  Constitutional: Positive for chills and fever.  HENT: Positive for sore throat.   Musculoskeletal: Positive for myalgias.  Neurological: Positive for headaches.  Physical Exam Updated Vital Signs BP 117/68 (BP Location: Left Arm)   Pulse (!) 110   Temp 98.6 F (37 C) (Oral)   Resp 18   Ht  (1.6 m)   Wt 170 lb (77.1 kg)   SpO2 99%   BMI 30.11 kg/m   Physical Exam  Constitutional: She appears well-developed and well-nourished.  HENT:  Head: Normocephalic and atraumatic.  Mouth/Throat: No oropharyngeal exudate.  oropharynx clear with no exudates or hypertrophy  Eyes: Conjunctivae are normal. Right eye exhibits no discharge. Left eye exhibits no discharge.  Cardiovascular: Tachycardia present.   Mild tachycardia  Pulmonary/Chest: Effort normal. No respiratory distress.  Neurological: She is alert.  Coordination normal.  Skin: Skin is warm and dry. No rash noted. She is not diaphoretic. No erythema.  Psychiatric: She has a normal mood and affect.  Nursing note and vitals reviewed.    ED Treatments / Results  DIAGNOSTIC STUDIES: Oxygen Saturation is 99% on RA, normal by my interpretation.  COORDINATION OF CARE:  10:21 AM Consulted pt about flu medication and she declined. Discussed treatment plan with pt at bedside and pt agreed to plan.   Labs (all labs ordered are listed, but only abnormal results are displayed) Labs Reviewed - No data to display  Radiology No results found.  Procedures Procedures (including critical care time)  Medications Ordered in ED Medications  ketorolac (TORADOL) injection 60 mg (not administered)     Initial Impression / Assessment and Plan / ED Course  I have reviewed the triage vital signs and the nursing notes.  Pertinent labs & imaging results that were available during my care of the patient were reviewed by me and considered in my medical decision making (see chart for details).    URI - / Viral URI / Flu like illness, otherwise in no distress and well appearing for d/c.  Discussed merits of tamiflu - pt declined.  Motrin given for home Tessalon given for home    Toradol -  IM for headache / aches / fevers  Final Clinical Impressions(s) / ED Diagnoses   Final diagnoses:  Influenza   I personally performed the services described in this documentation, which was scribed in my presence. The recorded information has been reviewed and is accurate.     New Prescriptions New Prescriptions   BENZONATATE (TESSALON) 100 MG CAPSULE    Take 1 capsule (100 mg total) by mouth every 8 (eight) hours.   IBUPROFEN (ADVIL,MOTRIN) 800 MG TABLET    Take 1 tablet (800 mg total) by mouth 3 (three) times daily.     Eber Hong, MD 06/11/16 1027

## 2016-06-21 ENCOUNTER — Encounter (HOSPITAL_COMMUNITY): Payer: Self-pay

## 2016-06-21 ENCOUNTER — Inpatient Hospital Stay (HOSPITAL_COMMUNITY)
Admission: AD | Admit: 2016-06-21 | Discharge: 2016-06-21 | Disposition: A | Discharge status: Home / Self Care | Payer: Medicaid Other | Source: Ambulatory Visit | Attending: Obstetrics and Gynecology | Admitting: Obstetrics and Gynecology

## 2016-06-21 DIAGNOSIS — N939 Abnormal uterine and vaginal bleeding, unspecified: Secondary | ICD-10-CM | POA: Insufficient documentation

## 2016-06-21 DIAGNOSIS — N920 Excessive and frequent menstruation with regular cycle: Secondary | ICD-10-CM

## 2016-06-21 DIAGNOSIS — Z87891 Personal history of nicotine dependence: Secondary | ICD-10-CM | POA: Insufficient documentation

## 2016-06-21 LAB — URINALYSIS, ROUTINE W REFLEX MICROSCOPIC
Glucose, UA: 100 mg/dL — AB
Ketones, ur: NEGATIVE mg/dL
Nitrite: POSITIVE — AB
Protein, ur: 100 mg/dL — AB
Specific Gravity, Urine: 1.025 (ref 1.005–1.030)
pH: 5 (ref 5.0–8.0)

## 2016-06-21 LAB — URINALYSIS, MICROSCOPIC (REFLEX)

## 2016-06-21 LAB — POCT PREGNANCY, URINE: Preg Test, Ur: NEGATIVE

## 2016-06-21 NOTE — MAU Provider Note (Signed)
  History     CSN: [id number]  Arrival date and time: 06/21/16 1945   First Provider Initiated Contact with Patient 06/21/16 2030      Chief Complaint  Patient presents with  . Vaginal Bleeding   HPI 21 yo G1P1 s/p TSVD on 05/16/16 without problems. Presents to MAU with c/o VB which started yesterday. 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  She denies any bowel or bladder dysfunction. Bottle feeding. No contraception. Has been sexual active x 1 a few weeks ago.      Past Medical History:  Diagnosis Date  . Anxiety     Past Surgical History:  Procedure Laterality Date  . ABDOMINAL SURGERY    . tubes in ears    . 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 Diabetes Maternal Grandmother   . Hypertension Maternal Grandmother   . COPD Maternal Grandmother   . Scoliosis Maternal Grandmother   . Heart disease Maternal Grandmother   . Hypertension Maternal Grandfather   . Other Maternal Grandfather     2 stents in heart  . Heart attack Paternal Grandfather   . Congestive Heart Failure Neg Hx     Social History  Substance Use Topics  . Smoking status: Former Smoker    Packs/day: 0.50    Types: Cigarettes  . Smokeless tobacco: Never Used  . Alcohol use No     Comment: none    Allergies: No Known Allergies  Prescriptions Prior to Admission  Medication Sig Dispense Refill Last Dose  . benzonatate (TESSALON) 100 MG capsule Take 1 capsule (100 mg total) by mouth every 8 (eight) hours. 21 capsule 0   . ibuprofen (ADVIL,MOTRIN) 800 MG tablet Take 1 tablet (800 mg total) by mouth 3 (three) times daily. 21 tablet 0   . norethindrone-ethinyl estradiol-iron (MICROGESTIN FE,GILDESS FE,LOESTRIN FE) 1.5-30 MG-MCG tablet Take 1 tablet by mouth daily. Start taking the pills daily when the baby is 19 weeks old. Recommend abstinence until after your 6 week postpartum visit. 1 Package 11   . senna-docusate (SENOKOT-S) 8.6-50 MG tablet Take 2  tablets by mouth daily. 100 tablet 1     Review of Systems  Constitutional: Negative.   Respiratory: Negative.   Cardiovascular: Negative.   Gastrointestinal: Negative.    Physical Exam   Blood pressure 105/64, pulse 71, resp. rate 18, unknown if currently breastfeeding.  Physical Exam  Constitutional: She appears well-developed and well-nourished.  Cardiovascular: Normal rate and normal heart sounds.   Respiratory: Effort normal and breath sounds normal.  GI: Soft. Bowel sounds are normal.  Genitourinary:  Genitourinary Comments: Nl EGBUS, menses noted, uterus small, mobile non tender, no adnexal masses or tenderness    MAU Course  Procedures none    Assessment and Plan  Normal cycle  Pt reassured. OK to use tampons. Reframe from IC until on contraception. Keep PP visit next week.   Hermina Staggers 06/21/2016, 8:47 PM

## 2016-06-21 NOTE — MAU Note (Addendum)
Patient presents with c/o vaginal bleeding that started yesterday. Patient states that it is very heavy and she is passing clots. Patient says she stopped bleeding 2 weeks postpartum and hasn't bled since yesterday. Patient denies any pain.

## 2016-06-29 ENCOUNTER — Encounter: Payer: Self-pay | Admitting: Advanced Practice Midwife

## 2016-06-29 ENCOUNTER — Ambulatory Visit (INDEPENDENT_AMBULATORY_CARE_PROVIDER_SITE_OTHER): Payer: Medicaid Other | Admitting: Advanced Practice Midwife

## 2016-06-29 DIAGNOSIS — Z3202 Encounter for pregnancy test, result negative: Secondary | ICD-10-CM

## 2016-06-29 LAB — POCT URINE PREGNANCY: Preg Test, Ur: NEGATIVE

## 2016-06-29 MED ORDER — PRENATAL 27-0.8 MG PO TABS: 1.0000 | ORAL_TABLET | Freq: Every day | ORAL | 11 refills | Status: DC

## 2016-06-29 NOTE — Progress Notes (Signed)
Samantha Sandoval is a 21 y.o. who presents for a postpartum visit. She is 5 weeks postpartum following a spontaneous vaginal delivery. I have fully reviewed the prenatal and intrapartum course. The delivery was at 40.1 gestational weeks.  Anesthesia: epidural. Postpartum course has been uneventful except for a few ED visits for strep throat, heavy period, vaginal pain r/t a suture. . Baby's course has been uneventful. Baby is feeding by bottle. Bleeding: no bleeding. Had a period 4/18.  Thinks she is and want to be pregnant. UPT neg today Bowel function is normal. Bladder function is normal. Patient is sexually active. Contraception method is none. Postpartum depression screening: negative.   Current Outpatient Prescriptions:  .  norethindrone-ethinyl estradiol-iron (MICROGESTIN FE,GILDESS FE,LOESTRIN FE) 1.5-30 MG-MCG tablet, Take 1 tablet by mouth daily. Start taking the pills daily when the baby is 42 weeks old. Recommend abstinence until after your 6 week postpartum visit., Disp: 1 Package, Rfl: 11 .  ibuprofen (ADVIL,MOTRIN) 800 MG tablet, Take 1 tablet (800 mg total) by mouth 3 (three) times daily. (Patient not taking: Reported on 06/29/2016), Disp: 21 tablet, Rfl: 0  Review of Systems   Constitutional: Negative for fever and chills Eyes: Negative for visual disturbances Respiratory: Negative for shortness of breath, dyspnea Cardiovascular: Negative for chest pain or palpitations  Gastrointestinal: Negative for vomiting, diarrhea and constipation Genitourinary: Negative for dysuria and urgency Musculoskeletal: Negative for back pain, joint pain, myalgias  Neurological: Negative for dizziness and headaches    Objective:     Vitals:   06/29/16 1002  BP: 100/60  Pulse: 76   General:  alert, cooperative and no distress   Breasts:  negative  Lungs: clear to auscultation bilaterally  Heart:  regular rate and rhythm  Abdomen: Soft, nontender   Vulva:  normal  Vagina: normal vagina   Cervix:  closed  Corpus: Well involuted     Rectal Exam: no hemorrhoids        Assessment:    normal postpartum exam.  Plan:   1. Contraception: none. Wants to get pregnant right away.  Advised against this.  PNV refilled.  2. Follow up in:   or as needed.

## 2016-07-13 ENCOUNTER — Encounter: Payer: Medicaid Other | Admitting: Obstetrics & Gynecology

## 2016-07-17 ENCOUNTER — Encounter: Payer: Self-pay | Admitting: Obstetrics & Gynecology

## 2016-07-17 ENCOUNTER — Ambulatory Visit (INDEPENDENT_AMBULATORY_CARE_PROVIDER_SITE_OTHER): Payer: Medicaid Other | Admitting: Obstetrics & Gynecology

## 2016-07-17 ENCOUNTER — Other Ambulatory Visit: Payer: Self-pay | Admitting: Obstetrics & Gynecology

## 2016-07-17 VITALS — BP 110/64 | HR 75 | Wt 200.0 lb

## 2016-07-17 DIAGNOSIS — N870 Mild cervical dysplasia: Secondary | ICD-10-CM | POA: Diagnosis not present

## 2016-07-17 DIAGNOSIS — Z3202 Encounter for pregnancy test, result negative: Secondary | ICD-10-CM

## 2016-07-17 LAB — POCT URINE PREGNANCY: Preg Test, Ur: NEGATIVE

## 2016-07-17 NOTE — Progress Notes (Signed)
Colposcopy Procedure Note:  Colposcopy Procedure Note  Indications: Pap smear 8 months ago showed: low-grade squamous intraepithelial neoplasia (LGSIL - encompassing HPV,mild dysplasia,CIN I). The prior pap showed no abnormalities.  Prior cervical/vaginal disease: . Prior cervical treatment: .  Smoker:  Yes.   New sexual partner:  No.  : time frame:    History of abnormal Pap: no  Procedure Details  The risks and benefits of the procedure and Written informed consent obtained.  Speculum placed in vagina and excellent visualization of cervix achieved, cervix swabbed x 3 with acetic acid solution.  Findings: Cervix: acetowhite lesion(s) noted at 12 o'clock, punctation noted at 12 o'clock and mosaicism noted at 12 o'clock; SCJ visualized - lesion at 12o'clock, cervical biopsies taken at 12 o'clock, specimen labelled and sent to pathology and hemostasis achieved with Monsel's solution. Vaginal inspection: normal without visible lesions. Vulvar colposcopy: vulvar colposcopy not performed.  Specimens: cervical biopsy  Complications: none.  Plan: Specimens labelled and sent to Pathology. Will base further treatment on Pathology findings. Return to discuss pathology results 1 week

## 2016-07-17 NOTE — Addendum Note (Signed)
Addended by: Colen DarlingYOUNG, Kyndal Heringer S on: 07/17/2016 04:56 PM   Modules accepted: Orders

## 2016-07-25 ENCOUNTER — Encounter: Payer: Self-pay | Admitting: Obstetrics & Gynecology

## 2016-07-28 ENCOUNTER — Ambulatory Visit (INDEPENDENT_AMBULATORY_CARE_PROVIDER_SITE_OTHER): Payer: Medicaid Other | Admitting: Obstetrics & Gynecology

## 2016-07-28 ENCOUNTER — Encounter: Payer: Self-pay | Admitting: Obstetrics & Gynecology

## 2016-07-28 VITALS — BP 100/52 | HR 72 | Wt 202.0 lb

## 2016-07-28 DIAGNOSIS — N870 Mild cervical dysplasia: Secondary | ICD-10-CM

## 2016-07-28 DIAGNOSIS — Z3202 Encounter for pregnancy test, result negative: Secondary | ICD-10-CM | POA: Diagnosis not present

## 2016-07-28 LAB — POCT URINE PREGNANCY: Preg Test, Ur: NEGATIVE

## 2016-07-28 NOTE — Progress Notes (Signed)
Follow up appointment for results  Chief Complaint  Patient presents with  . Follow-up    results from Colpo    Blood pressure (!) 100/52, pulse 72, weight 202 lb (91.6 kg), last menstrual period 07/16/2016, not currently breastfeeding.    Discussed results with patient and family Has LSIL on biopsy  Follow up 6 months for colposcopy, repeat Pap and colpo at 1 year  MEDS ordered this encounter: No orders of the defined types were placed in this encounter.   Orders for this encounter: Orders Placed This Encounter  Procedures  . POCT urine pregnancy    Impression: Dysplasia of cervix, low grade (CIN 1)  Pregnancy examination or test, negative result - Plan: POCT urine pregnancy   - POCT urine pregnancy   Plan: 6 months colpo only  Follow Up: Return in about 6 months (around 01/28/2017) for colpo , with Dr Despina HiddenEure.       Face to face time:  10 minutes  Greater than 50% of the visit time was spent in counseling and coordination of care with the patient.  The summary and outline of the counseling and care coordination is summarized in the note above.   All questions were answered.  Past Medical History:  Diagnosis Date  . Anxiety     Past Surgical History:  Procedure Laterality Date  . ABDOMINAL SURGERY    . tubes in ears    . tumor 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      OB History    Gravida Para Term Preterm AB Living   1 1 1     1    SAB TAB Ectopic Multiple Live Births         0 1      No Known Allergies  Social History   Social History  . Marital status: Married    Spouse name: N/A  . Number of children: N/A  . Years of education: N/A   Social History Main Topics  . Smoking status: Current Every Day Smoker    Packs/day: 0.25    Types: Cigarettes  . Smokeless tobacco: Never Used     Comment: less than  . Alcohol use No     Comment: none  . Drug use: No     Comment: denies  . Sexual activity: Yes    Birth control/ protection: None   Other Topics  Concern  . None   Social History Narrative  . None    Family History  Problem Relation Age of Onset  . Diabetes Maternal Grandmother   . Hypertension Maternal Grandmother   . COPD Maternal Grandmother   . Scoliosis Maternal Grandmother   . Heart disease Maternal Grandmother   . Hypertension Maternal Grandfather   . Other Maternal Grandfather        2 stents in heart  . Heart attack Paternal Grandfather   . Congestive Heart Failure Neg Hx

## 2017-01-29 ENCOUNTER — Encounter: Payer: Self-pay | Admitting: Obstetrics & Gynecology

## 2017-01-29 ENCOUNTER — Other Ambulatory Visit: Payer: Self-pay | Admitting: Obstetrics & Gynecology

## 2017-01-29 ENCOUNTER — Ambulatory Visit (INDEPENDENT_AMBULATORY_CARE_PROVIDER_SITE_OTHER): Payer: Medicaid Other | Admitting: Obstetrics & Gynecology

## 2017-01-29 VITALS — BP 124/76 | HR 78 | Ht 63.0 in | Wt 219.0 lb

## 2017-01-29 DIAGNOSIS — N871 Moderate cervical dysplasia: Secondary | ICD-10-CM

## 2017-01-29 DIAGNOSIS — Z3202 Encounter for pregnancy test, result negative: Secondary | ICD-10-CM | POA: Diagnosis not present

## 2017-01-29 DIAGNOSIS — R87613 High grade squamous intraepithelial lesion on cytologic smear of cervix (HGSIL): Secondary | ICD-10-CM

## 2017-01-29 LAB — POCT URINE PREGNANCY: Preg Test, Ur: NEGATIVE

## 2017-01-29 NOTE — Addendum Note (Signed)
Addended by: Tish FredericksonLANCASTER, Kambrey Hagger A on: 01/29/2017 10:30 AM   Modules accepted: Orders

## 2017-01-29 NOTE — Progress Notes (Signed)
Colposcopy Procedure Note:  Colposcopy Procedure Note  Indications: Pap smear 13 months ago showed: low-grade squamous intraepithelial neoplasia (LGSIL - encompassing HPV,mild dysplasia,CIN I). The prior pap showed n/a.   All of this was while Fleet ContrasRachel was pregnant I performed a colposcopy about 8 weeks after delivery which revealed low-grade squamous intraepithelial lesion She is here today for reevaluation with colposcopy biopsy if needed  Smoker:  Yes.   New sexual partner:  No.    History of abnormal Pap: yes  Procedure Details  The risks and benefits of the procedure and Written informed consent obtained.  Speculum placed in vagina and excellent visualization of cervix achieved, cervix swabbed x 3 with acetic acid solution.  Findings: Cervix:   there is widespread acetowhite changes with punctation and also areas of mosaicism which is concerning for the possibility of progression to high-grade dysplasia As a result a biopsy is taken today The lab biopsy was taken at 12:00 and this biopsy is taken more at 11:00 of the cervix The area of acetowhite changes is large but the entire squamocolumnar junction is seen easily ; . Vaginal inspection: normal without visible lesions. Vulvar colposcopy: vulvar colposcopy not performed.  Specimens: Cervical biopsy   Complications: none.  Plan: Specimens labelled and sent to Pathology. Will base further treatment on Pathology findings. Return to discuss Pathology results in 1 week.

## 2017-02-09 ENCOUNTER — Ambulatory Visit (INDEPENDENT_AMBULATORY_CARE_PROVIDER_SITE_OTHER): Payer: Medicaid Other | Admitting: Obstetrics & Gynecology

## 2017-02-09 ENCOUNTER — Encounter: Payer: Self-pay | Admitting: Obstetrics & Gynecology

## 2017-02-09 ENCOUNTER — Ambulatory Visit: Payer: Medicaid Other | Admitting: Obstetrics & Gynecology

## 2017-02-09 VITALS — BP 92/58 | HR 110 | Ht 63.0 in | Wt 220.0 lb

## 2017-02-09 DIAGNOSIS — R87613 High grade squamous intraepithelial lesion on cytologic smear of cervix (HGSIL): Secondary | ICD-10-CM | POA: Diagnosis not present

## 2017-02-09 NOTE — Progress Notes (Signed)
Preoperative History and Physical  Samantha Sandoval is a 21 y.o. G1P1001 with Patient's last menstrual period was 01/25/2017. 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    PMH:    Past Medical History:  Diagnosis Date  . Anxiety   . Vaginal Pap smear, abnormal     PSH:     Past Surgical History:  Procedure Laterality Date  . ABDOMINAL SURGERY    . tubes in ears    . tumor 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      POb/GynH:      OB History    Gravida Para Term Preterm AB Living   1 1 1     1    SAB TAB Ectopic Multiple Live Births         0 1      SH:   Social History   Tobacco Use  . Smoking status: Former Smoker    Packs/day: 0.25    Types: Cigarettes  . Smokeless tobacco: Never Used  Substance Use Topics  . Alcohol use: No    Comment: none  . Drug use: No    Comment: denies    FH:    Family History  Problem Relation Age of Onset  . Diabetes Maternal Grandmother   . Hypertension Maternal Grandmother   . COPD Maternal Grandmother   . Scoliosis Maternal Grandmother   . Heart disease Maternal Grandmother   . Hypertension Maternal Grandfather   . Other Maternal Grandfather        2 stents in heart  . Heart attack Paternal Grandfather   . Congestive Heart Failure Neg Hx      Allergies: No Known Allergies  Medications:      No current outpatient medications on file.  Review of Systems:   Review of Systems  Constitutional: Negative for fever, chills, weight loss, malaise/fatigue and diaphoresis.  HENT: Negative for hearing loss, ear pain, nosebleeds, congestion, sore throat, neck pain, tinnitus and ear discharge.   Eyes: Negative for blurred vision, double vision, photophobia, pain, discharge and redness.  Respiratory: Negative for cough, hemoptysis, sputum production, shortness of breath, wheezing and stridor.   Cardiovascular: Negative for chest pain, palpitations, orthopnea,  claudication, leg swelling and PND.  Gastrointestinal: Positive for abdominal pain. Negative for heartburn, nausea, vomiting, diarrhea, constipation, blood in stool and melena.  Genitourinary: Negative for dysuria, urgency, frequency, hematuria and flank pain.  Musculoskeletal: Negative for myalgias, back pain, joint pain and falls.  Skin: Negative for itching and rash.  Neurological: Negative for dizziness, tingling, tremors, sensory change, speech change, focal weakness, seizures, loss of consciousness, weakness and headaches.  Endo/Heme/Allergies: Negative for environmental allergies and polydipsia. Does not bruise/bleed easily.  Psychiatric/Behavioral: Negative for depression, suicidal ideas, hallucinations, memory loss and substance abuse. The patient is not nervous/anxious and does not have insomnia.      PHYSICAL EXAM:  Blood pressure (!) 92/58, pulse (!) 110, height 5\' 3"  (1.6 m), weight 220 lb (99.8 kg), last menstrual period 01/25/2017, not currently breastfeeding.    Vitals reviewed. Constitutional: She is oriented to person, place, and time. She appears well-developed and well-nourished.  HENT:  Head: Normocephalic and atraumatic.  Right Ear: External ear normal.  Left Ear: External ear normal.  Nose: Nose normal.  Mouth/Throat: Oropharynx is clear and moist.  Eyes: Conjunctivae and EOM are normal. Pupils are equal, round, and reactive to light. Right eye exhibits no discharge. Left eye exhibits no  discharge. No scleral icterus.  Neck: Normal range of motion. Neck supple. No tracheal deviation present. No thyromegaly present.  Cardiovascular: Normal rate, regular rhythm, normal heart sounds and intact distal pulses.  Exam reveals no gallop and no friction rub.   No murmur heard. Respiratory: Effort normal and breath sounds normal. No respiratory distress. She has no wheezes. She has no rales. She exhibits no tenderness.  GI: Soft. Bowel sounds are normal. She exhibits no  distension and no mass. There is tenderness. There is no rebound and no guarding.  Genitourinary:       Vulva is normal without lesions Vagina is pink moist without discharge Cervix per colposcopy report Uterus is normal size, contour, position, consistency, mobility, non-tender Adnexa is negative with normal sized ovaries by sonogram  Musculoskeletal: Normal range of motion. She exhibits no edema and no tenderness.  Neurological: She is alert and oriented to person, place, and time. She has normal reflexes. She displays normal reflexes. No cranial nerve deficit. She exhibits normal muscle tone. Coordination normal.  Skin: Skin is warm and dry. No rash noted. No erythema. No pallor.  Psychiatric: She has a normal mood and affect. Her behavior is normal. Judgment and thought content normal.    Labs: Results for orders placed or performed in visit on 01/29/17 (from the past 336 hour(s))  POCT urine pregnancy   Collection Time: 01/29/17  9:41 AM  Result Value Ref Range   Preg Test, Ur Negative Negative    EKG: Orders placed or performed during the hospital encounter of 04/22/16  . ED EKG  . ED EKG    Imaging Studies: No results found.    Assessment: HSIL of the cervix, large expansive lesion   Plan: Laser ablation of the cervix 03/14/2017  Pt understands the risk of cervical stenosis and incompetence  Lazaro ArmsLuther H Dempsy Damiano 02/09/2017 9:30 AM     Face to face time:  15 minutes  Greater than 50% of the visit time was spent in counseling and coordination of care with the patient.  The summary and outline of the counseling and care coordination is summarized in the note above.   All questions were answered.

## 2017-02-23 ENCOUNTER — Encounter: Payer: Self-pay | Admitting: Obstetrics & Gynecology

## 2017-03-06 NOTE — L&D Delivery Note (Signed)
Delivery Note Samantha Sandoval is a 22 y.o. G3P1011 at [redacted]w[redacted]d admitted for early labor.  Labor course: augmented w/ pitocin and arom At 2203 a viable female was delivered via spontaneous vaginal delivery (Presentation:LOA) w/ loose nuchal x 1 reduced immediately after birth.  Infant placed directly on mom's abdomen for bonding/skin-to-skin. Delayed cord clamping x <MEASURClauSci-Waymart Forensic Treatment CenteMere63St2[facility] REvang51096el(5Freida BusmaMu908-MLakeside Ambulatory Surgical Clent [place]<MEASUBaptist Medical CenteMere(3S7Anderson EndoscopEvang096Freida BusmaMu662Clent [place]<MEASUREMClauSaunders Medical CenteMere83(St5Endocentre At QuarterfieldEvang8096el9Freida BusmaMu765-MIndiana University Health BloomingtoClent Ridge<MEASUREMClauColiseum Psychiatric HospitaMere1St57Monteflore Nyack Evang19096el9Freida BusmaMu30MThree RivClent Ridge<MEASUREMClauAtlantic Coastal Surgery CenteMere28St44North Oaks MedicaEvang38096el2Freida BusmaMu669-MDelray Beach SurgiClent Ridgescal Suitesexficlamped x 2, and cut by FOB and grandma.  APGAR: 9,9 ; weight: pending at time of note.  40 units of pitocin diluted in 1000cc LR was infused rapidly IV per protocol<MEASUREMClauRidgeview Medical CenteMereSt72[facility] Evang57096el4Freida BusmaMu63MColumbus Endoscopy Clent Ridge<MEASUREMClauCrawford County Memorial HospitaMere71St15Methodist Extended Care Evang70096el(41Freida BusmaMu223-MBeaumont Surgery Center LLC Dba Highland Springs SurgiClent Ridgescal Centerexficeparated spontaneously and delivered via CCT and maternal pushing effort.  It was inspected and appears to be intact with a 3 VC.  Placenta/Cord with the following complications<MEASUREMClauRegional Health Lead-Deadwood HospitaMere24(St66[facility] Moses Evang87096el3Freida BusmaMu(434)MMedstar Franklin Square MediClent Ridgescal CenterexficH: not done  Intrapartum complications:  None Anesthesia:  epidural Episiotomy: none Lacerations:  none Suture Repair: none Est. Blood Loss (mL): 52JaUnited Stat<MEASUREMClauHill Crest Behavioral Health ServiceMere41St76Sparrow Ionia Evang18096el4Freida BusmaMu506 MPort Orange Endoscopy And SurgClent Ridge<MEASUREMClauSouthwestern State HospitaMere79(St5Desert Parkway Behavioral Healthcare HospiEvang096el(6Freida BusmaMu(210) MThe Rehabilitation Hospital Of SouthwesClent Ridge<MEASUREMClauBig Island Endoscopy CenteMerSt41Riverside SurgerEvang3096el(7Freida BusmaMu(236)MUniversity MediClent Ridge<MEASUREMClauEndoscopy Center Of Bucks County LMere10St6Bluffton Okatie Surgery CeEvang7096el2Freida BusmaMu810-MC S Medical LLC Dba Delaware SuClent Ridgesgical ArtsexficeDVanOrChildren'S Mercy HosMarylene65784(5Signe CoMarland KitchenltGwynCascade Endoscopy CeBronson [id number] Surgery Center LLMicah 415mJaUnited [place] Virgin Isl<MEASUREMClauGenesis Behavioral HospitaMere26St17Saint Francis Gi EndosEvang2096el(4Freida BusmaMu832-MAcmClent Ridge<MEASUREMClauBaylor Scott And White PavilioMere58St64William [facility] Veterans Evang5096el(6Freida BusmaMu(667) MBanner Health Mountain Vista SurgClent Ridge<MEASUREMClauChinese HospitaMere88St3Select Specialty Hospital - WyandoEvang6096el9Freida BusmaMu651-MGrady MemoriaClent Ridge<MEASUREMClauGrisell Memorial Hospital LtcMere16St7Eleanor Slater Evang1096el2Freida BusmaMu251-MCass Regional MediClent Ridgescal CenterexficKam Memorial Hospital ([phone] CoMarland KitchenltGwynMercy Medical Center-[phone] And Clark Specialty HospitaMicah (8250mJaUnited States Virgin IslMeleDVan<MEASUREMClauAscension Seton Medical Center WilliamsoMere59St4Pam Rehabilitation Hospital Of CentenniEvang78096el2Freida BusmaMu(650) MToledo Clinic Dba Toledo Clinic Outpatient SurgClent Ridge<MEASUREMClauValley Eye Surgical CenteMere32(St5St. Peter'S Addiction RecoverEvang5096el(5Freida BusmaMu334 MSilver Oaks BehavoriaClent Ridge<MEASUREMClauCurahealth Heritage ValleMereSt41Encompass Health Rehabilitation Hospital Of SEvang48096el3Freida BusmaMu443-MArrowhead Regional MediClent Ridge<MEASUSwedish American HospitaMerHoly Cross Germantown Evang096Freida Bus(831Clent [place]<MEASUREMClauSalinas Surgery CenteMere57(2St44Southeastern Ambulatory Surgery CeEvang39096el5Freida BusmaMu763-MRoxborough MemoriaClent Ridge<MEASUREMClauLitchfield Hills Surgery CenteMere35(St6W. G. (Bill) Hefner Va MedicaEvang56096el(6Freida BusmaMu(254)MOcean Surgical PClent Ridgesavilion Pcexfican Mem MeMarylene657849Signe CoMarland KitchenltGwynSheridan Va MedicaBig Island [id number] County HospitaMicah 4843mJaUnited States Virgin IslMeleDVanOrGood Samaritan H<MEASUREMClauSsm St. Joseph Health CenteMere12(9St19Encompass Health Sunrise Rehabilitation Hospital OfEvang096el4Freida BusmaMu820-MNorton CommunitClent Ridge<MEASUREMClauAvera Holy Family HospitaMerSt1Surgery Center Of Pembroke Pines LLC Dba Broward Specialty SurgicaEvang11096el(6Freida BusmaMu51MFirst Gi Endoscopy And Surgery Clent [place]<MEASUREMClauProvidence - Park HospitaMere35St96Methodist Surgery Center GermaEvang80096el6Freida BusmaMu(804) MMetropolitan HospiClent Ridgestal CenterexficSigne CoMarland KitchenltGwynReagan Memorial South Central Regional [phone] Human Resource InstitutMicah (58018mJaUnited States Virgin IslMeleDVanOrCamden Clark Medical CMarylene657843Signe CoMarland KitchenltGwynKell West Regional Eye Surgery Center Of [id number] Endoscopy And Pain Center LLMicah 2259mJaUnited States Virgin IslMeleDVanOrPam Rehabilitation <MEASUREMClauSt John Vianney CenteMere12St84Kindred HospiEvang78096el7Freida BusmaMu443-MJourney Lite Of CincClent Ridgesinnati LLCexficarylene657847Signe CoMarland KitchenltGwynJonesboro Surgery CeSaint Lukes Gi [phone] IncorporateMicah (3039mJaUnited States Virgin IslMeleDVanOrNewport HosMarylene65784(7Signe CoMarland KitchenltGwynGeising<MEASUREMClauWhite Flint Surgery LLMereSt46Children'S Hospital Of SanEvang11096el(21Freida BusmaMu873-MToms River Ambulatory SurgiClent Ridge<MEASUREMClauSan Leandro Surgery Center Ltd A California Limited PartnershiMere43St73Bellville MedicaEvang6096el3Freida BusmaMu531-MCentro De Salud Integral DClent Ridge<MEASUREMClauSchaumburg Surgery CenteMereSt2The Endoscopy CeEvang60096el(3Freida BusmaMu531 MWilson SClent Ridge<MEASUREMClauOdessa Regional Medical CenteMere65St7Salem Va MedicaEvang5096el8Freida BusmaMu(346)MHemetClent Ridge<MEASUREMClauPlantation General HospitaMere26St53Encompass Health Rehabilitation Hospital Of TEvang1096el(3Freida BusmaMu339-MGrace Hospital SoClent Ridgesuth PointeexficSt Luke'S Miners [phone] Valley HospitaMicah (8776mJaUnited States Virgin IslMeleDVanOrDepartment Of Veterans Affairs Medical CMarylene657846Signe CoMarland KitchenltGwynBaylor Scott & White Medical Center -Mercy San [phone] Community HospitaMicah 3888mJaUnited States Virgin IslMeleDVanOrMedstar Franklin Square Medical CMarylene657849Signe CoMarland KitchenltGwynFoothill Presbyterian Hospital-Johnston Clay [phone] Ambulatory Surgery Center Dba The Surgery CenteMicah 8652mJaUnited States Virgin IslMeleDVanOrPresence Chicago Hospitals Network Db<MEASUREMClauBetsy Johnson HospitaMere37St82James H. Quillen Va MedicaEvang1096el7Freida BusmaMu318-MHebrew Home And HoClent Ridge<MEASUREMClauSurgicare Surgical Associates Of Fairlawn LLMere41St83Albany Regional Eye Surgery CeEvang20096el3Freida BusmaMu(929) MSt Lukes Surgical Clent RidgesCenter Incexfic Francis HosMarylene65784(7Signe CoMarland KitchenltGwynPost Acute Medical Specialty Hospital Of MMercy Medical [phone] County HospitaMicah 3166mJaUnited States Virgin IslMeleDVanOrMethodist Texsan HosMarylene657843Signe CoMarland KitchenltGwynEast Memphis [phone] Jackson Memorial HospitaMicah (9728mJaUnited States Virgin IslMeleDVanOrDignity Health-St. Rose Dominican Sahara CMarylene657846Signe CoMarland KitchenltGwynTri <MEASUREMClauSpartan Health Surgicenter LLMere43St8Meridian ServiEvang4096el(56Freida BusmaMu(480)MF. W. Huston MediClent Ridgescal Centerexficrology AsHosp312 389 087St Francis HospitaMicah 76mJaUnited States Virgin IslMeleDVanOrCentral New York Asc Dba Omni Outpatient Surgery CMarylene65784(7Signe CoMarland KitchenltGwynBaylor Scott & White Surgical Hospital AtOhsu [phone] [address] Chemical Dependency Recovery HospitaMicah 7121mJaUnited States Virgin IsDVanVal Verde Regional Medical657Signe CoMarland KitchenEast Campus Surgery <MEASUREMClauCarolina Continuecare At UniversitMere57St67Holland Eye CEvang7096el(41Freida BusmaMu215-MMccurtain MemoriaClent Ridge<MEASUREMClauSaint Marys HospitaMere31StThe Hand CeEvang66096el8Freida BusmaMu312-MPoudre ValleClent Ridge<MEASUREMClauJackson Hospital And CliniMere8St4Pearl River County Evang14096el8Freida BusmaMu484 MMercy Hospital - Mercy Hospital Orchard PaClent Ridgesk Divisionexficpper Extremity Surgery Center Of [phone] Diagnostic And Surgery Center LLMicah (5136mJaUnited States Virgin IslMeleDVanOrBeaumont Hospital DeaMarylene657846Signe CoMarland KitchenltGwy<MEASUREMClauSouthwest General HospitaMere44St45Adventhealth Dehavioral HealtEvang096el(7Freida BusmaMu940-MLindenhurst Surgery Cl[place]<MEASUREMClauPalacios Community Medical CenteMere76(St41Hill Crest Behavioral Health Evang50096el(6Freida BusmaMu94MMarshfield CliniClent Ridge<MEASUREMClauMercy Hospital Fort ScotMere24St1WakemEvang2096eFreida BusmaMu778-MHca Houston HealtClent Ridge<MEASUREMClauScl Health Community Hospital - SouthwesMere34(St48Adams Memorial Evang6096el9Freida BusmaMu931 MSanford Medical CeClent Ridge<MEASUREMClauVibra Hospital Of Western Mass Central CampuMere6St83Transformations SurgerEvang51096el(70Freida BusmaMu(423)MFranklin Surgical Clent [place]<MEASUREMClauGreenwood County HospitaMere69St96Northern [place] Eye Surgery CeEvang69096el6Freida BusmaMu61MSaint Luke'S CushinClent Ridge<MEASUREMClauRidgeview Institute MonroMere63(St11Marion General Evang5096el(80Freida BusmaMu956-MAdventist Health SonoClent Ridge<MEASUREMClauSaint Josephs Wayne HospitaMere65St61Gastrointestinal HealtEvang2096el5Freida BusmaMu(780)MSoutheastern Ohio Regional MediClent Ridge<MEASUREMClauGeorgiana Medical CenteMere53St57Cottonwoodsouthwestern EyEvang6096el2Freida BusmaMu51MSouth Austin Surgery Clent [place]<MEASUREMClauUpstate University Hospital - Community CampuMere24StCommunity Memorial Hospital-San BuenEvang37096el(2Freida BusmaMu630-MWills MemoriaClent Ridge<MEASUREMClauBaptist Medical Center YazoMereSt4Rehabilitation Institute Of [facility] - Dba Shirley Ryan AbEvang5096el6Freida BusmaMu425-MHot Springs RehabilitatClent Ridge<MEASUREMClauEisenhower Army Medical CenteMere53(St6Central Coast Endoscopy CeEvang4096el6Freida BusmaMu(367)MAnderson Regional Medical CeClent Ridge<MEASUWestside Endoscopy CenteMereS2Houston Physicians' Evang67096Freida BusmaMu(902Clent [place]<MEASURiverside Ambulatory Surgery CenteMereS2Woodlands EndoscopEvang7096Freida BusmaMu907Cl[place]<MEASUREMClauUpmc LititMere33(3St83Forsyth Eye SurgerEvang19096el7Freida BusmaMu(872)MMobile Infirmary MediClent Ridge<MEASUREMClauSurgery Center Of Long BeacMere63(St5Fresno Surgical Evang49096el(7Freida BusmaMu620-MMaine Eye Care Clent RidgesAssociatesexfic MedicaShore [phone] Behavioral HealthcarMicah (711mJaUnited States Virgin IslMeleDVanOrVa North Florida/South Georgia Healthcare System - LakeMarylene65784(2Signe CoMarland KitchenltGwynSt Lukes Behavioral Sweetwater [phone] Hospital-South Florida-HollywooMicah (413mJaUnited States Virgin IslMeleDVanOrVa Southern Nevada Healthcare SMarylene65784(46Signe CoMarland KitchenltGwynTruman Medical Center - HospiCitrus Valley Medical [phone] Hospital SoutMicah [phone] Mantlerument count were correct x2.  Mom to postpartum.  Baby to Couplet care / Skin to Skin. Placenta to L&D. Plans to breastfeed Contraception: POPs Circ: n/a  Govanni Plemons R Lajada Janes CNM, WHNP-BC 01/22/2018 10:17 PM   Samantha Sandoval R, CNM  Stallings, Angela M, RMA        Please schedule this patient for PP visit in: 6 weeks  Low risk pregnancy complicated by: none  Delivery mode: SVD  Anticipated Birth Control: POPs  PP Procedures needed: none  Schedule Integrated BH visit: no  Provider: Any provider

## 2017-03-07 ENCOUNTER — Ambulatory Visit: Payer: Medicaid Other | Admitting: Adult Health

## 2017-03-07 ENCOUNTER — Other Ambulatory Visit (HOSPITAL_COMMUNITY): Payer: Self-pay

## 2017-03-14 ENCOUNTER — Ambulatory Visit: Admit: 2017-03-14 | Payer: Self-pay | Admitting: Obstetrics & Gynecology

## 2017-03-14 SURGERY — ABLATION, CERVIX
Anesthesia: General

## 2017-03-15 ENCOUNTER — Encounter: Payer: Medicaid Other | Admitting: Obstetrics & Gynecology

## 2017-03-15 ENCOUNTER — Encounter: Payer: Self-pay | Admitting: Obstetrics & Gynecology

## 2017-03-19 NOTE — Patient Instructions (Signed)
Samantha Sandoval  03/19/2017     @PREFPERIOPPHARMACY @   Your procedure is scheduled on  03/28/2017   Report to [facility] at  1150   A.M.  Call this number if you have problems the morning of surgery:  [phone]   Remember:  Do not eat food or drink liquids after midnight.  Take these medicines the morning of surgery with A SIP OF WATER  None   Do not wear jewelry, make-up or nail polish.  Do not wear lotions, powders, or perfumes, or deodorant.  Do not shave 48 hours prior to surgery.  Men may shave face and neck.  Do not bring valuables to the hospital.  [facility] is not responsible for any belongings or valuables.  Contacts, dentures or bridgework may not be worn into surgery.  Leave your suitcase in the car.  After surgery it may be brought to your room.  For patients admitted to the hospital, discharge time will be determined by your treatment team.  Patients discharged the day of surgery will not be allowed to drive home.   Name and phone number of your driver:   family Special instructions:  None  Please read over the following fact sheets that you were given. Anesthesia Post-op Instructions and Care and Recovery After Surgery       Cervical Laser Surgery Cervical laser surgery is a procedure that uses a focused beam of light (laser) to shrink, destroy, or remove tissue from the opening of the uterus (cervix). You may have this surgery if:  You have abnormal cells (dysplasia) in your cervix that are an early sign of cancer.  Tissue from your cervix is to be tested for signs of disease.  Tell a health care provider about:  Any allergies you have.  All medicines you are taking, including vitamins, herbs, eye drops, creams, and over-the-counter medicines.  Any problems you or family members have had with anesthetic medicines.  Any blood disorders you have.  Any surgeries you have had.  Any medical conditions you have.  Whether you are  pregnant or may be pregnant. What are the risks? Generally, this is a safe procedure. However, problems may occur, including:  Bleeding.  Infection.  Allergic reactions to medicines or dyes.  Damage to other structures or organs.  Narrowing of the cervix (cervical stenosis). This can lead to infertility, cervical incompetency, and miscarriage or preterm labor in a future pregnancy.  What happens before the procedure?  Ask your health care provider about changing or stopping your regular medicines. This is especially important if you are taking diabetes medicines or blood thinners.  For at least 24 hours before the procedure: ? Do not have sex. ? Do not use tampons. ? Do not douche. ? Do not use vaginal creams or medicines. What happens during the procedure?  To lower your risk of infection, your health care team will wash or sanitize their hands.  You will lie on your back with your feet raised in footrests (stirrups).  A device called a speculum will be put into your vagina to hold it open.  You will be given a medicine to numb the area (local anesthetic). You may feel cramping when the medicine is injected.  A magnifying instrument called a colposcope will be placed into your vagina. It will shine a light and allow your health care provider to see your vagina and cervix.  A solution will be swabbed on  your cervix to help your health care provider see the tissue.  A thin, flexible tube called an endoscope will be inserted into your vagina. The laser will be on the end of the endoscope.  The laser will be used to shrink, destroy, or remove the tissue.  A cotton swab soaked in solution may be used to remove any burned or destroyed tissue.  A paste may be applied to your cervix to stop bleeding. The procedure may vary among health care providers and hospitals. What happens after the procedure?  You may feel some pain or cramping for a few days.  You may have some  bleeding and brownish discharge.  Wear sanitary pads for discharge.  Do not have sex or put anything in your vagina until your health care provider says it is okay.  Your health care provider may recommend that you limit your physical activity for a few days. Ask your health care provider what activities are safe for you. Summary  Cervical laser surgery is a procedure that uses a focused beam of light (laser) to shrink, destroy, or remove tissue from the cervix.  You may have this procedure if you have abnormal cells in your cervix that are an early sign of cancer, or if tissue from your cervix is to be tested for signs of disease.  Generally, this is a safe procedure. However, problems may occur.  Do not have sex, use tampons, douche, or use vaginal creams or medicines for at least 24 hours before the procedure.  You may have pain, cramping, bleeding, and discharge for a few days after the procedure. This information is not intended to replace advice given to you by your health care provider. Make sure you discuss any questions you have with your health care provider. Document Released: 01/10/2016 Document Revised: 01/10/2016 Document Reviewed: 01/10/2016 Elsevier Interactive Patient Education  2018 [employer]  Cervical Laser Surgery, Care After This sheet gives you information about how to care for yourself after your procedure. Your health care provider may also give you more specific instructions. If you have problems or questions, contact your health care provider. What can I expect after the procedure? After the procedure, it is common to have:  Pain or discomfort.  Mild cramping.  Bleeding, spotting, or brownish discharge from your vagina.  Follow these instructions at home: Activity  Return to your normal activities as told by your health care provider. Ask your health care provider what activities are safe for you.  Do not lift anything that is heavier than 10 lb  (4.5 kg), or the limit that your health care provider tells you, until he or she says that it is safe.  Do not have sex or put anything in your vagina until your health care provider says it is okay. General instructions  Take over-the-counter and prescription medicines only as told by your health care provider.  Do not drive or use heavy machinery while taking prescription pain medicine.  Wear sanitary pads to protect from bleeding, spotting, and discharge.  Do not use tampons or douche until your health care provider says it is okay.  It is up to you to get the results of your procedure. Ask your health care provider, or the department that is doing the procedure, when your results will be ready.  Keep all follow-up visits as told by your health care provider. This is important. Contact a health care provider if:  Your pain or cramping does not improve.  Your periods  are more painful than usual.  You do not get your period as expected. Get help right away if:  You have any symptoms of infection, such as: ? A fever. ? Chills. ? Discharge that smells bad.  You have severe pain in your abdomen.  You have heavy bleeding from your vagina (more than a normal period).  You have vaginal bleeding with clumps of blood (blood clots). Summary  After this procedure, it is common to have pain or discomfort and mild cramping. It is also common to have bleeding, spotting, or brownish discharge from your vagina.  You may need to wear sanitary pads to protect from bleeding, spotting, and discharge.  Do not have sex, use tampons, or douche until your health care provider says it is okay.  Return to your normal activities as told by your health care provider. Ask your health care provider what activities are safe for you.  Take over-the-counter and prescription medicines only as told by your health care provider. These include medicines for pain. This information is not intended to  replace advice given to you by your health care provider. Make sure you discuss any questions you have with your health care provider. Document Released: 01/10/2016 Document Revised: 01/10/2016 Document Reviewed: 01/10/2016 Elsevier Interactive Patient Education  2018 [employer]  General Anesthesia, Adult General anesthesia is the use of medicines to make a person "go to sleep" (be unconscious) for a medical procedure. General anesthesia is often recommended when a procedure:  Is long.  Requires you to be still or in an unusual position.  Is major and can cause you to lose blood.  Is impossible to do without general anesthesia.  The medicines used for general anesthesia are called general anesthetics. In addition to making you sleep, the medicines:  Prevent pain.  Control your blood pressure.  Relax your muscles.  Tell a health care provider about:  Any allergies you have.  All medicines you are taking, including vitamins, herbs, eye drops, creams, and over-the-counter medicines.  Any problems you or family members have had with anesthetic medicines.  Types of anesthetics you have had in the past.  Any bleeding disorders you have.  Any surgeries you have had.  Any medical conditions you have.  Any history of heart or lung conditions, such as heart failure, sleep apnea, or chronic obstructive pulmonary disease (COPD).  Whether you are pregnant or may be pregnant.  Whether you use tobacco, alcohol, marijuana, or street drugs.  Any history of Financial plannermilitary service.  Any history of depression or anxiety. What are the risks? Generally, this is a safe procedure. However, problems may occur, including:  Allergic reaction to anesthetics.  Lung and heart problems.  Inhaling food or liquids from your stomach into your lungs (aspiration).  Injury to nerves.  Waking up during your procedure and being unable to move (rare).  Extreme agitation or a state of mental  confusion (delirium) when you wake up from the anesthetic.  Air in the bloodstream, which can lead to stroke.  These problems are more likely to develop if you are having a major surgery or if you have an advanced medical condition. You can prevent some of these complications by answering all of your health care provider's questions thoroughly and by following all pre-procedure instructions. General anesthesia can cause side effects, including:  Nausea or vomiting  A sore throat from the breathing tube.  Feeling cold or shivery.  Feeling tired, washed out, or achy.  Sleepiness or drowsiness.  Confusion or agitation.  What happens before the procedure? Staying hydrated Follow instructions from your health care provider about hydration, which may include:  Up to 2 hours before the procedure - you may continue to drink clear liquids, such as water, clear fruit juice, black coffee, and plain tea.  Eating and drinking restrictions Follow instructions from your health care provider about eating and drinking, which may include:  8 hours before the procedure - stop eating heavy meals or foods such as meat, fried foods, or fatty foods.  6 hours before the procedure - stop eating light meals or foods, such as toast or cereal.  6 hours before the procedure - stop drinking milk or drinks that contain milk.  2 hours before the procedure - stop drinking clear liquids.  Medicines  Ask your health care provider about: ? Changing or stopping your regular medicines. This is especially important if you are taking diabetes medicines or blood thinners. ? Taking medicines such as aspirin and ibuprofen. These medicines can thin your blood. Do not take these medicines before your procedure if your health care provider instructs you not to. ? Taking new dietary supplements or medicines. Do not take these during the week before your procedure unless your health care provider approves them.  If you  are told to take a medicine or to continue taking a medicine on the day of the procedure, take the medicine with sips of water. General instructions   Ask if you will be going home the same day, the following day, or after a longer hospital stay. ? Plan to have someone take you home. ? Plan to have someone stay with you for the first 24 hours after you leave the hospital or clinic.  For 3-6 weeks before the procedure, try not to use any tobacco products, such as cigarettes, chewing tobacco, and e-cigarettes.  You may brush your teeth on the morning of the procedure, but make sure to spit out the toothpaste. What happens during the procedure?  You will be given anesthetics through a mask and through an IV tube in one of your veins.  You may receive medicine to help you relax (sedative).  As soon as you are asleep, a breathing tube may be used to help you breathe.  An anesthesia specialist will stay with you throughout the procedure. He or she will help keep you comfortable and safe by continuing to give you medicines and adjusting the amount of medicine that you get. He or she will also watch your blood pressure, pulse, and oxygen levels to make sure that the anesthetics do not cause any problems.  If a breathing tube was used to help you breathe, it will be removed before you wake up. The procedure may vary among health care providers and hospitals. What happens after the procedure?  You will wake up, often slowly, after the procedure is complete, usually in a recovery area.  Your blood pressure, heart rate, breathing rate, and blood oxygen level will be monitored until the medicines you were given have worn off.  You may be given medicine to help you calm down if you feel anxious or agitated.  If you will be going home the same day, your health care provider may check to make sure you can stand, drink, and urinate.  Your health care providers will treat your pain and side effects  before you go home.  Do not drive for 24 hours if you received a sedative.  You may: ?  Feel nauseous and vomit. ? Have a sore throat. ? Have mental slowness. ? Feel cold or shivery. ? Feel sleepy. ? Feel tired. ? Feel sore or achy, even in parts of your body where you did not have surgery. This information is not intended to replace advice given to you by your health care provider. Make sure you discuss any questions you have with your health care provider. Document Released: 05/30/2007 Document Revised: 08/03/2015 Document Reviewed: 02/04/2015 Elsevier Interactive Patient Education  2018 [place] Anesthesia, Adult, Care After These instructions provide you with information about caring for yourself after your procedure. Your health care provider may also give you more specific instructions. Your treatment has been planned according to current medical practices, but problems sometimes occur. Call your health care provider if you have any problems or questions after your procedure. What can I expect after the procedure? After the procedure, it is common to have:  Vomiting.  A sore throat.  Mental slowness.  It is common to feel:  Nauseous.  Cold or shivery.  Sleepy.  Tired.  Sore or achy, even in parts of your body where you did not have surgery.  Follow these instructions at home: For at least 24 hours after the procedure:  Do not: ? Participate in activities where you could fall or become injured. ? Drive. ? Use heavy machinery. ? Drink alcohol. ? Take sleeping pills or medicines that cause drowsiness. ? Make important decisions or sign legal documents. ? Take care of children on your own.  Rest. Eating and drinking  If you vomit, drink water, juice, or soup when you can drink without vomiting.  Drink enough fluid to keep your urine clear or pale yellow.  Make sure you have little or no nausea before eating solid foods.  Follow the diet  recommended by your health care provider. General instructions  Have a responsible adult stay with you until you are awake and alert.  Return to your normal activities as told by your health care provider. Ask your health care provider what activities are safe for you.  Take over-the-counter and prescription medicines only as told by your health care provider.  If you smoke, do not smoke without supervision.  Keep all follow-up visits as told by your health care provider. This is important. Contact a health care provider if:  You continue to have nausea or vomiting at home, and medicines are not helpful.  You cannot drink fluids or start eating again.  You cannot urinate after 8-12 hours.  You develop a skin rash.  You have fever.  You have increasing redness at the site of your procedure. Get help right away if:  You have difficulty breathing.  You have chest pain.  You have unexpected bleeding.  You feel that you are having a life-threatening or urgent problem. This information is not intended to replace advice given to you by your health care provider. Make sure you discuss any questions you have with your health care provider. Document Released: 05/29/2000 Document Revised: 07/26/2015 Document Reviewed: 02/04/2015 Elsevier Interactive Patient Education  [employer].

## 2017-03-21 ENCOUNTER — Other Ambulatory Visit: Payer: Self-pay | Admitting: Obstetrics & Gynecology

## 2017-03-22 ENCOUNTER — Other Ambulatory Visit (HOSPITAL_COMMUNITY): Payer: Self-pay

## 2017-03-22 ENCOUNTER — Other Ambulatory Visit: Payer: Self-pay

## 2017-03-22 ENCOUNTER — Encounter: Payer: Medicaid Other | Admitting: Obstetrics & Gynecology

## 2017-03-22 ENCOUNTER — Telehealth: Payer: Self-pay | Admitting: Obstetrics & Gynecology

## 2017-03-22 ENCOUNTER — Encounter (HOSPITAL_COMMUNITY)
Admission: RE | Admit: 2017-03-22 | Discharge: 2017-03-22 | Disposition: A | Discharge status: Home / Self Care | Payer: Medicaid Other | Source: Ambulatory Visit | Attending: Obstetrics & Gynecology | Admitting: Obstetrics & Gynecology

## 2017-03-22 ENCOUNTER — Encounter (HOSPITAL_COMMUNITY): Payer: Self-pay

## 2017-03-22 DIAGNOSIS — D069 Carcinoma in situ of cervix, unspecified: Secondary | ICD-10-CM | POA: Diagnosis not present

## 2017-03-22 DIAGNOSIS — Z01812 Encounter for preprocedural laboratory examination: Secondary | ICD-10-CM | POA: Diagnosis not present

## 2017-03-22 LAB — URINALYSIS, ROUTINE W REFLEX MICROSCOPIC
Bilirubin Urine: NEGATIVE
Glucose, UA: NEGATIVE mg/dL
Hgb urine dipstick: NEGATIVE
Ketones, ur: 5 mg/dL — AB
Nitrite: NEGATIVE
Protein, ur: NEGATIVE mg/dL
Specific Gravity, Urine: 1.031 — ABNORMAL HIGH (ref 1.005–1.030)
pH: 5 (ref 5.0–8.0)

## 2017-03-22 LAB — COMPREHENSIVE METABOLIC PANEL
ALT: 17 U/L (ref 14–54)
AST: 15 U/L (ref 15–41)
Albumin: 3.9 g/dL (ref 3.5–5.0)
Alkaline Phosphatase: 87 U/L (ref 38–126)
Anion gap: 8 (ref 5–15)
BUN: 9 mg/dL (ref 6–20)
CO2: 22 mmol/L (ref 22–32)
Calcium: 8.8 mg/dL — ABNORMAL LOW (ref 8.9–10.3)
Chloride: 106 mmol/L (ref 101–111)
Creatinine, Ser: 0.74 mg/dL (ref 0.44–1.00)
GFR calc Af Amer: >60 mL/min (ref >60)
GFR calc non Af Amer: >60 mL/min (ref >60)
Glucose, Bld: 99 mg/dL (ref 65–99)
Potassium: 3.8 mmol/L (ref 3.5–5.1)
Sodium: 136 mmol/L (ref 135–145)
Total Bilirubin: 0.5 mg/dL (ref 0.3–1.2)
Total Protein: 7 g/dL (ref 6.5–8.1)

## 2017-03-22 LAB — RAPID HIV SCREEN (HIV 1/2 AB+AG)
HIV 1/2 Antibodies: NONREACTIVE
HIV-1 P24 Antigen - HIV24: NONREACTIVE

## 2017-03-22 LAB — CBC
HCT: 40.6 % (ref 36.0–46.0)
Hemoglobin: 12.9 g/dL (ref 12.0–15.0)
MCH: 27.4 pg (ref 26.0–34.0)
MCHC: 31.8 g/dL (ref 30.0–36.0)
MCV: 86.2 fL (ref 78.0–100.0)
Platelets: 256 10*3/uL (ref 150–400)
RBC: 4.71 MIL/uL (ref 3.87–5.11)
RDW: 13.2 % (ref 11.5–15.5)
WBC: 7 10*3/uL (ref 4.0–10.5)

## 2017-03-22 LAB — HCG, QUANTITATIVE, PREGNANCY: hCG, Beta Chain, Quant, S: <1 m[IU]/mL (ref <5)

## 2017-03-22 NOTE — Telephone Encounter (Signed)
Patient called stating that she is having surgery and needs to know how long she is going to be out of work. Pt states that she might need a note for work if she can not return the next day. Please contact pt

## 2017-03-23 ENCOUNTER — Encounter: Payer: Self-pay | Admitting: Obstetrics & Gynecology

## 2017-03-23 NOTE — Telephone Encounter (Signed)
LMOVM that she should only be out of work no more than a couple of days.  Informed she could also send Dr Despina HiddenEure a mychart message to get more details on that as well.

## 2017-03-25 IMAGING — US US OB LIMITED
1 series · 13 of 13 positions shown · non-contrast
Comparison: none

CLINICAL DATA: Pelvic pressure and pain since yesterday. Estimated
gestational age by LMP is 25 weeks 0 days.

EXAM:
LIMITED OBSTETRIC ULTRASOUND

[Series 1: us ob limited · 0.23mm/px · 13 of 13 slices shown]
[im 1/13]
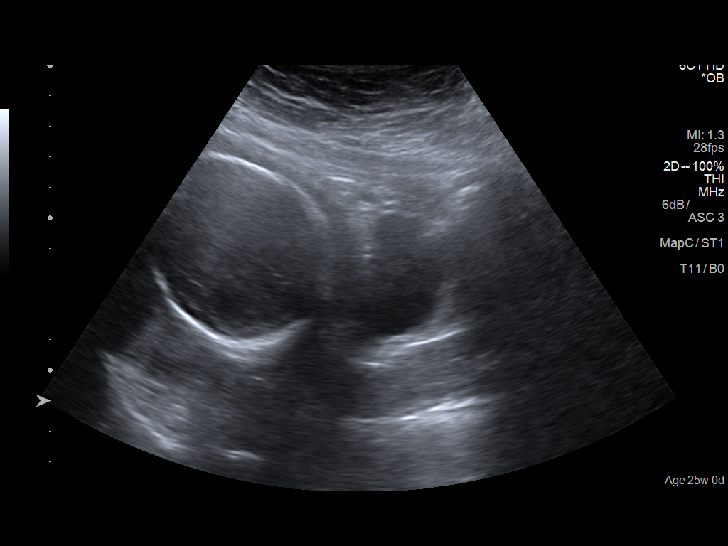
[im 2/13]
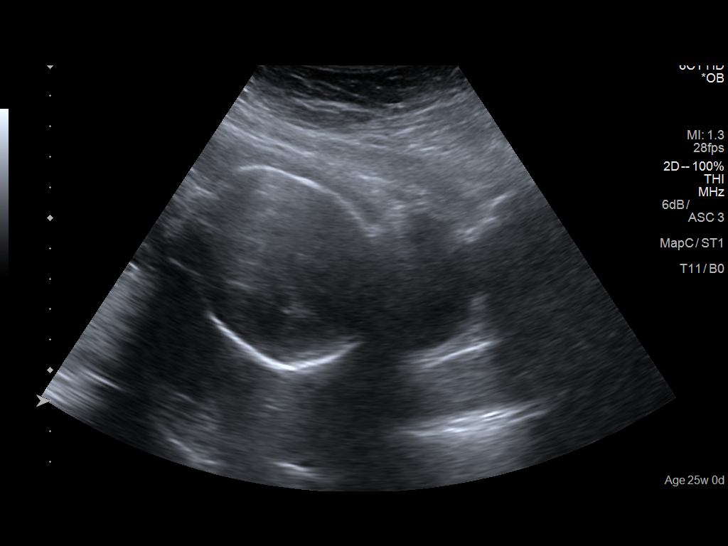
[im 3/13]
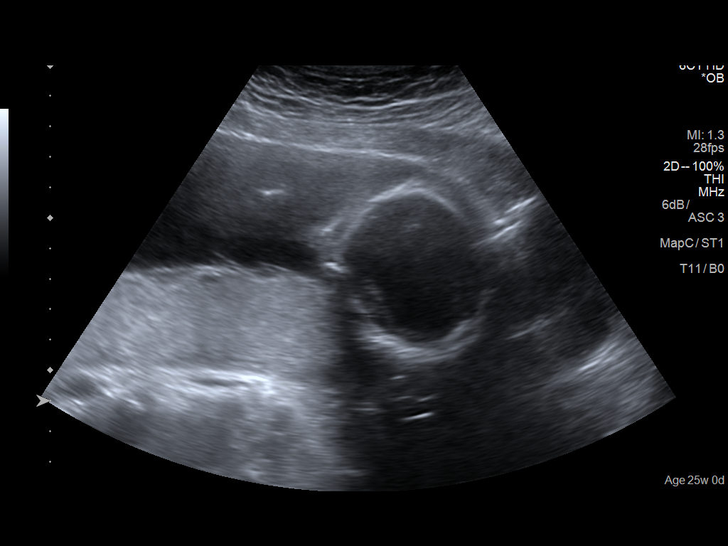
[im 4/13]
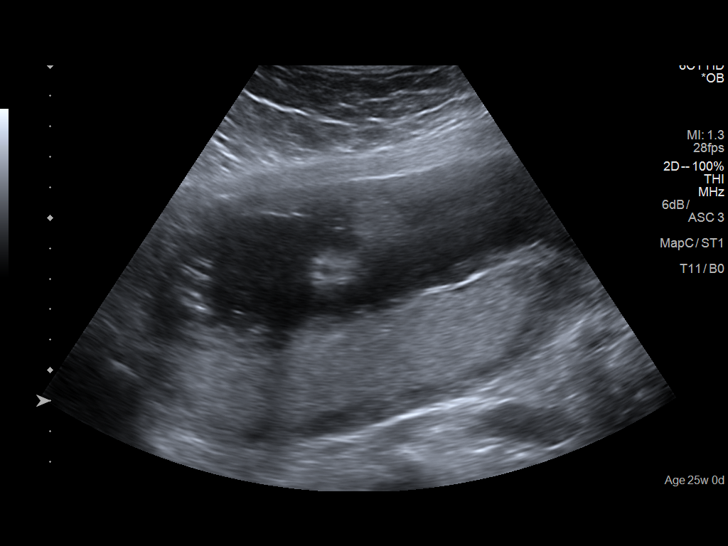
[im 5/13]
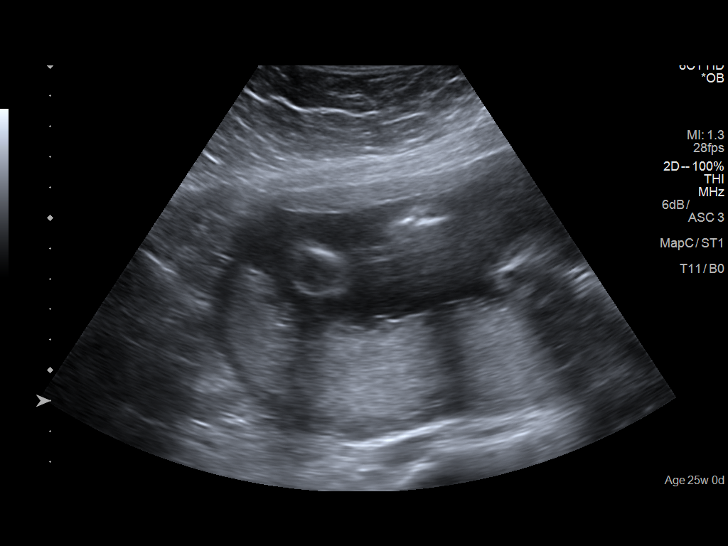
[im 6/13]
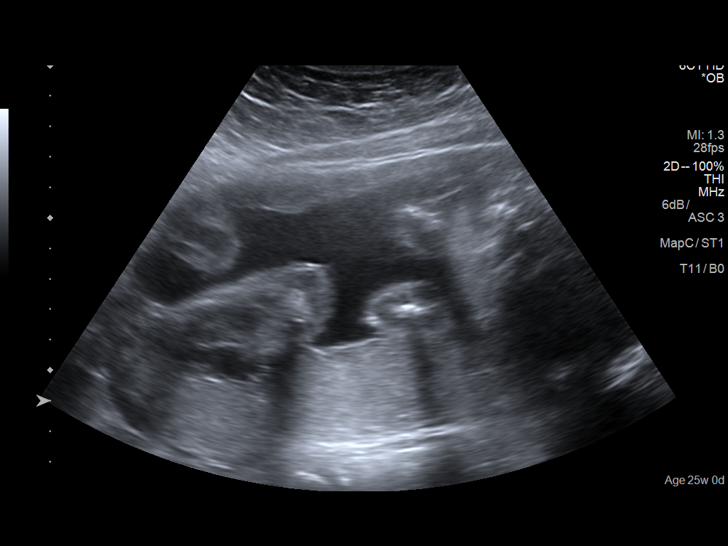
[im 7/13]
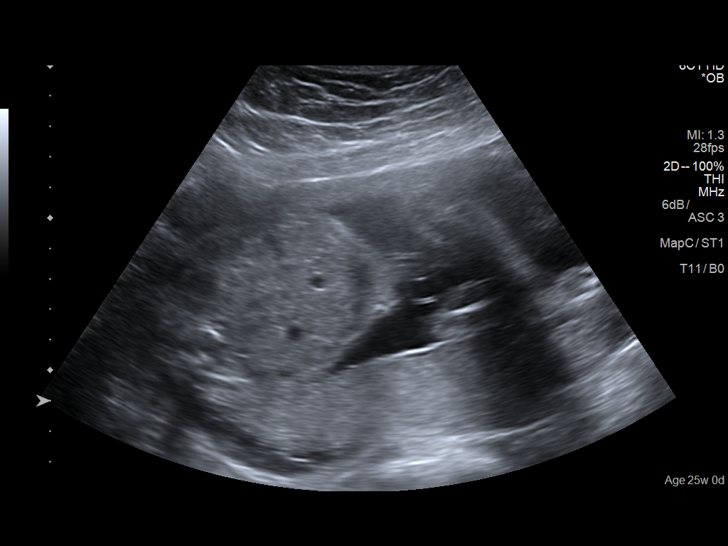
[im 8/13]
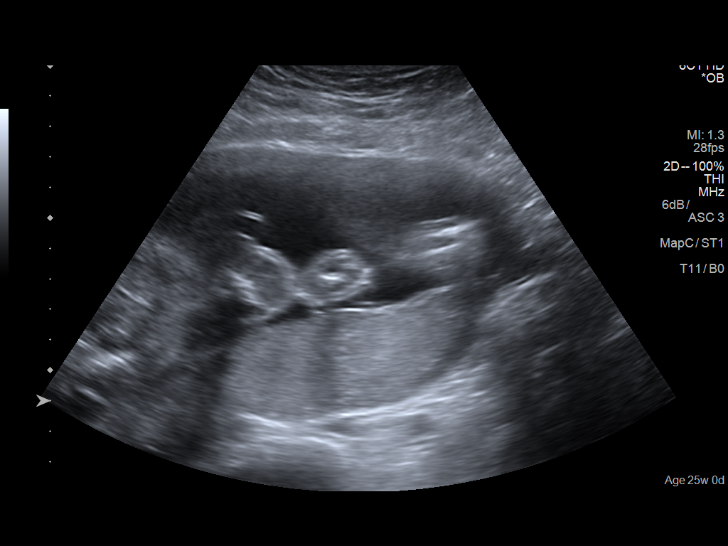
[im 9/13]
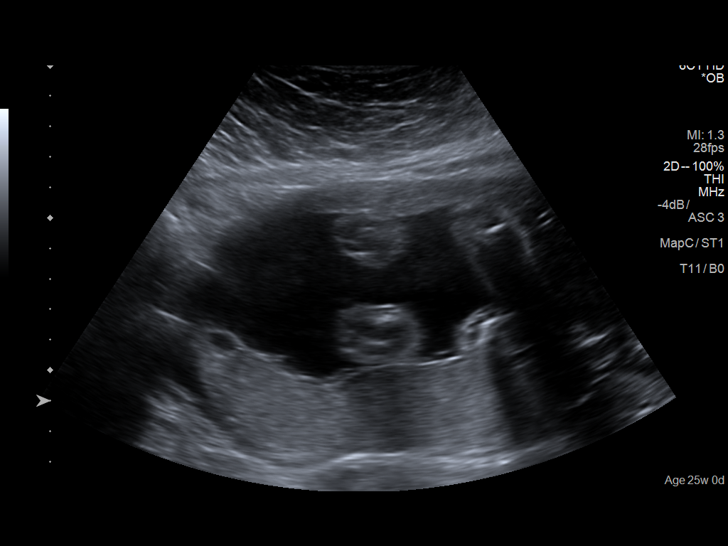
[im 10/13]
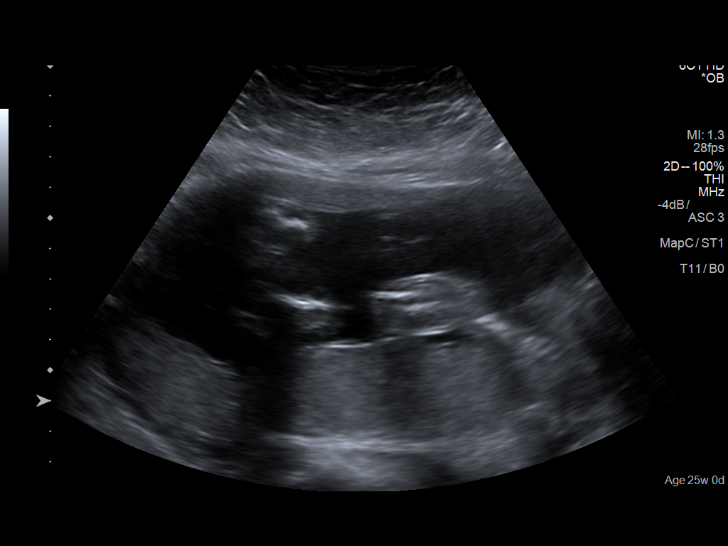
[im 11/13]
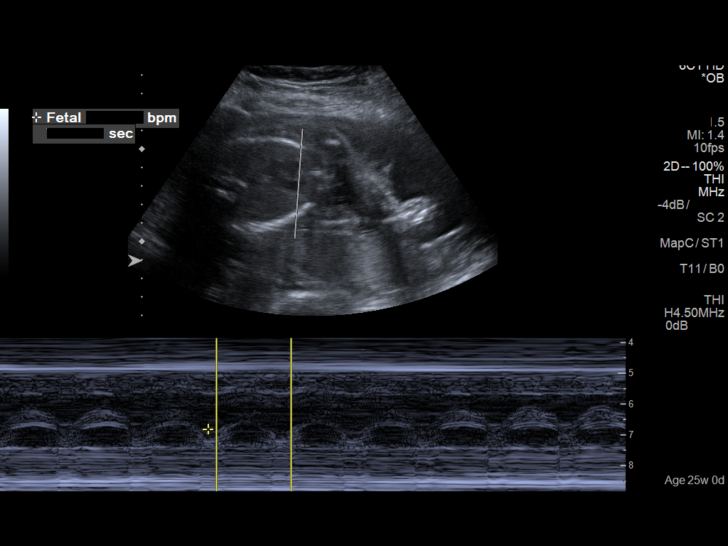
[im 12/13]
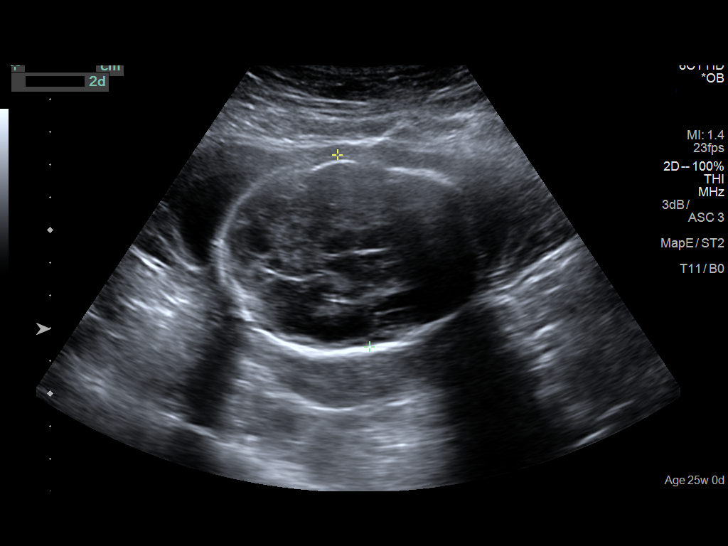
[im 13/13]
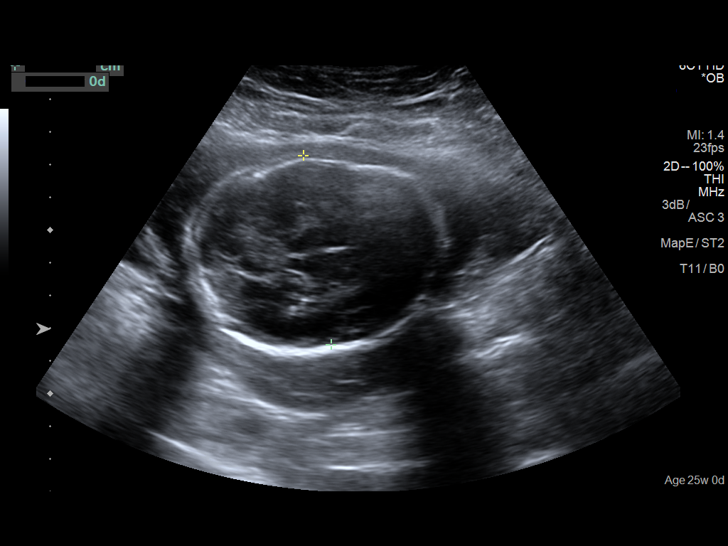

[13 of 13 positions shown; findings below may reference images not displayed]

FINDINGS: Number of Fetuses: 1

Heart Rate:  137 bpm

Movement: Fetal movement is observed.

Presentation: Fetus is shown in cephalic presentation.

Placental Location: Placenta is posterior and homogeneous.

Previa: No previa.  Placenta is not low lying.

Amniotic Fluid (Subjective):  Within normal limits.

FL:  5.9cm 24w  1d

MATERNAL FINDINGS:

Cervix:  Appears closed.

Uterus/Adnexae:  Limited visualization.  Ovaries are not identified.
IMPRESSION: Single intrauterine pregnancy in cephalic presentation. No acute
complication is identified on limited imaging.

This exam is performed on an emergent basis and does not
comprehensively evaluate fetal size, dating, or anatomy; follow-up
complete OB US should be considered if further fetal assessment is
warranted.

## 2017-03-25 IMAGING — US US ABDOMEN COMPLETE
1 series · 14 of 25 positions shown · non-contrast
Comparison: None.

CLINICAL DATA: 20 y/o F; right upper and lower quadrant abdominal
pain since yesterday. Pregnant patient.

EXAM:
ABDOMEN ULTRASOUND COMPLETE

[Series 1: us abdomen complete · 0.23mm/px · 14 of 83 slices shown]
[im 1/83]
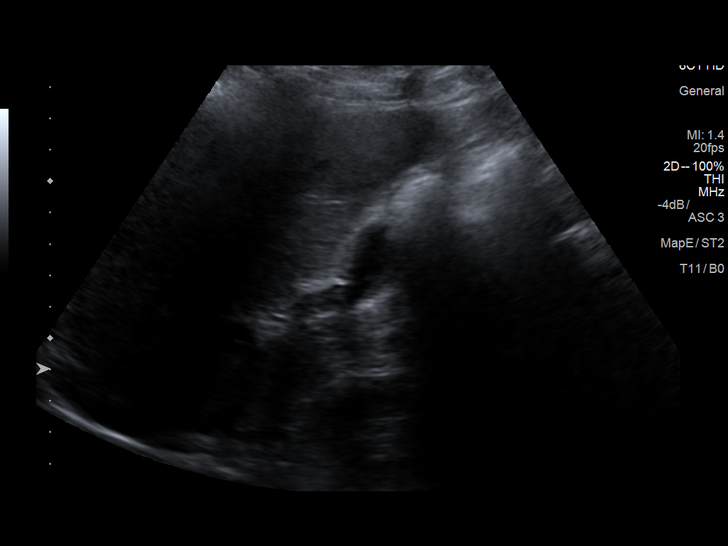
[im 7/83]
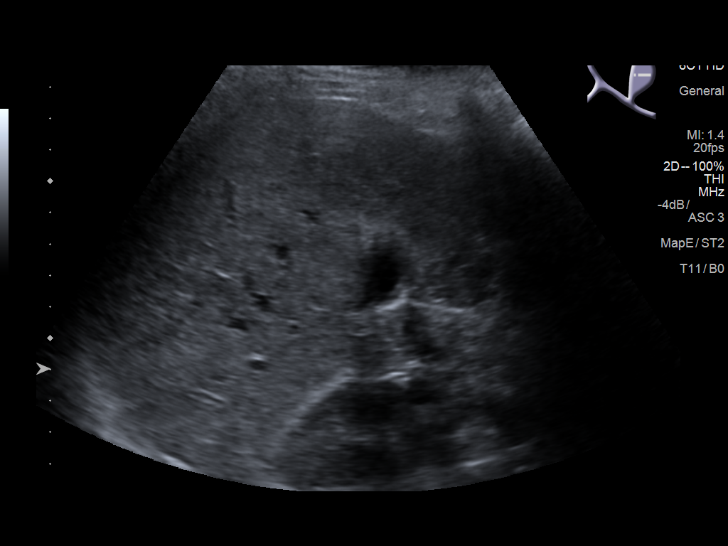
[im 14/83]
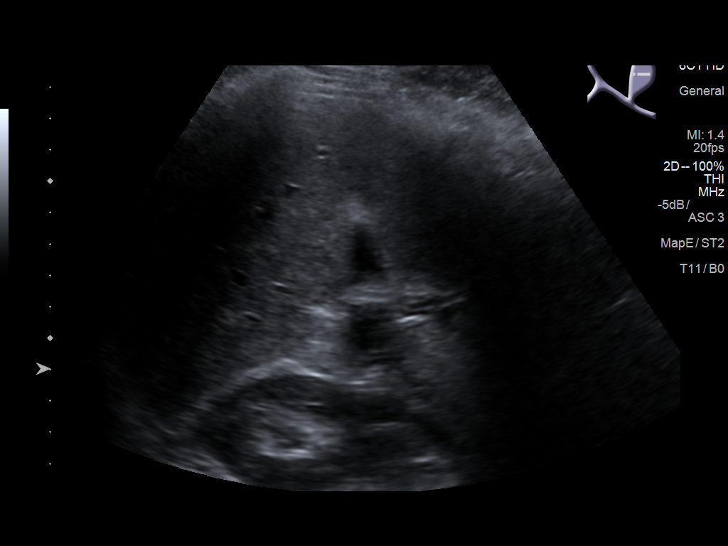
[im 21/83]
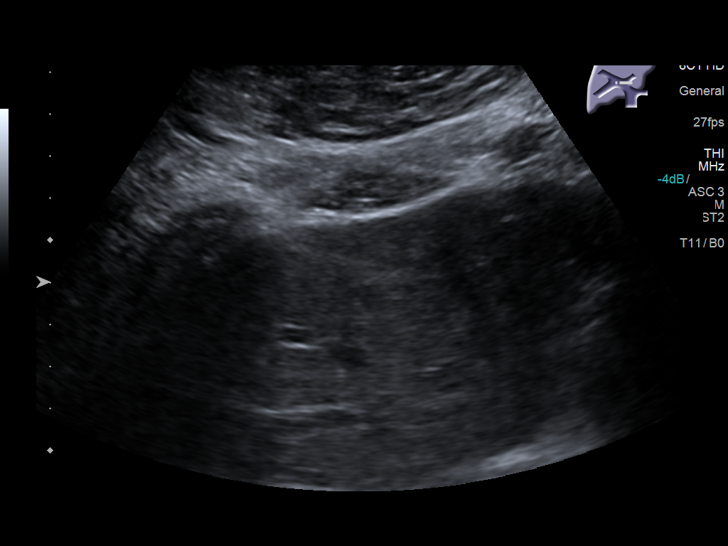
[im 28/83]
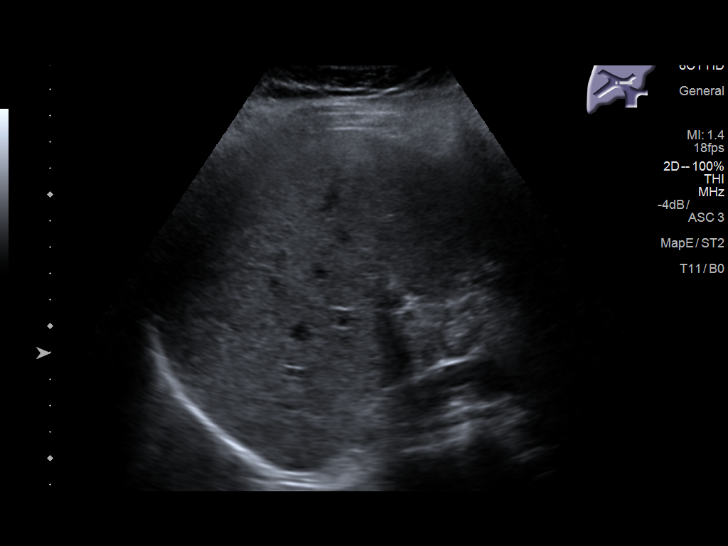
[im 31/83]
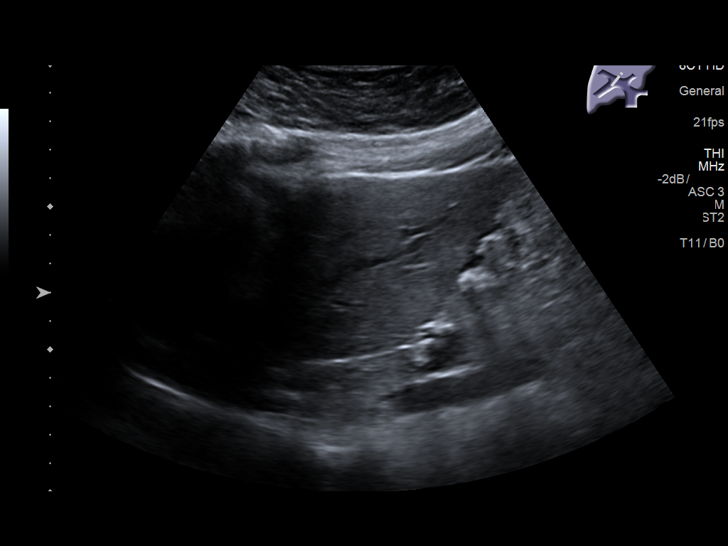
[im 38/83]
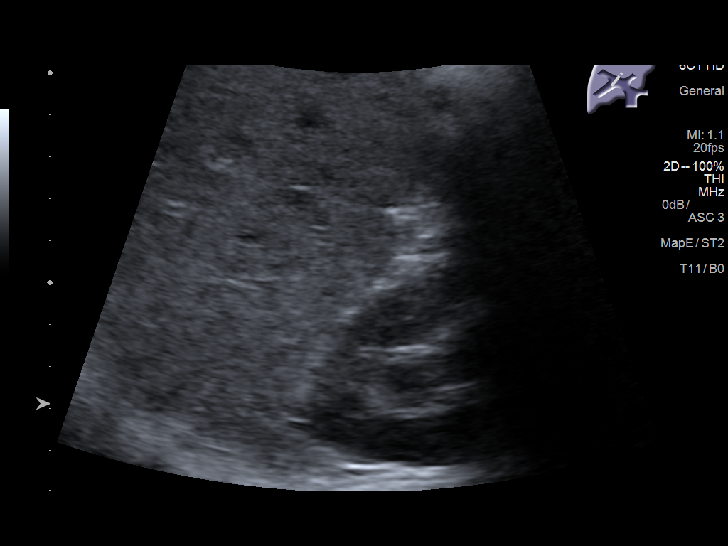
[im 45/83]
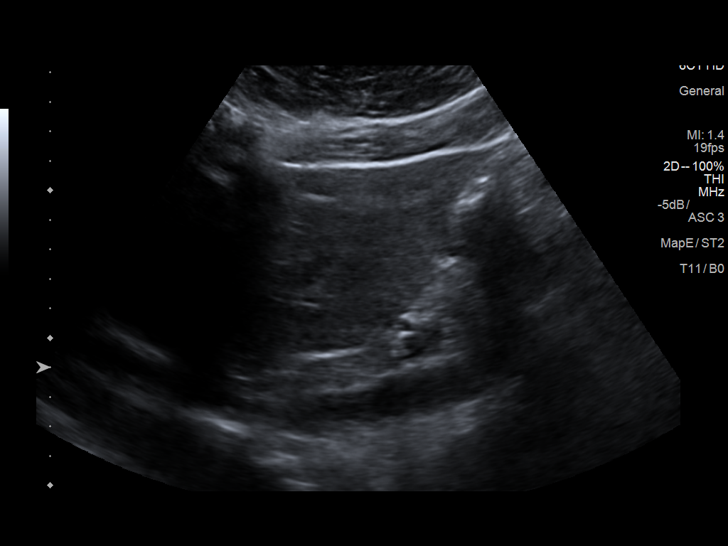
[im 52/83]
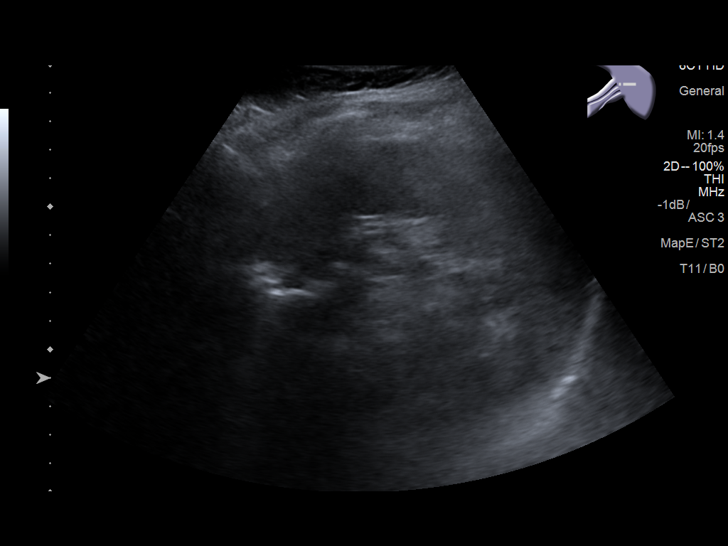
[im 55/83]
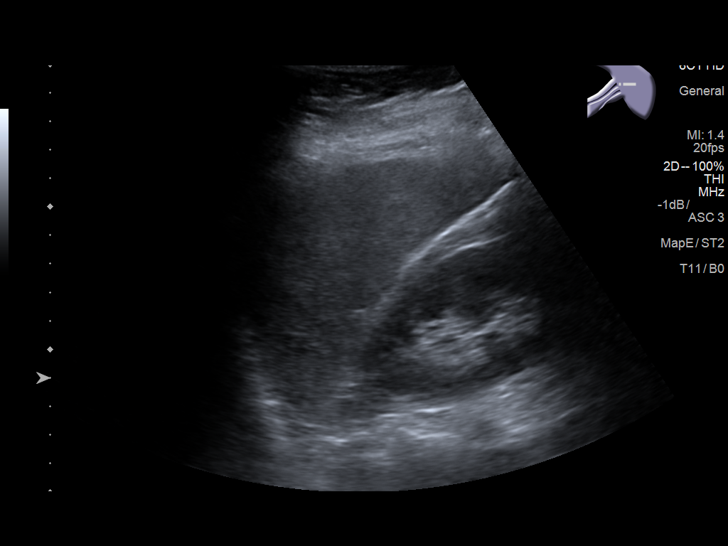
[im 62/83]
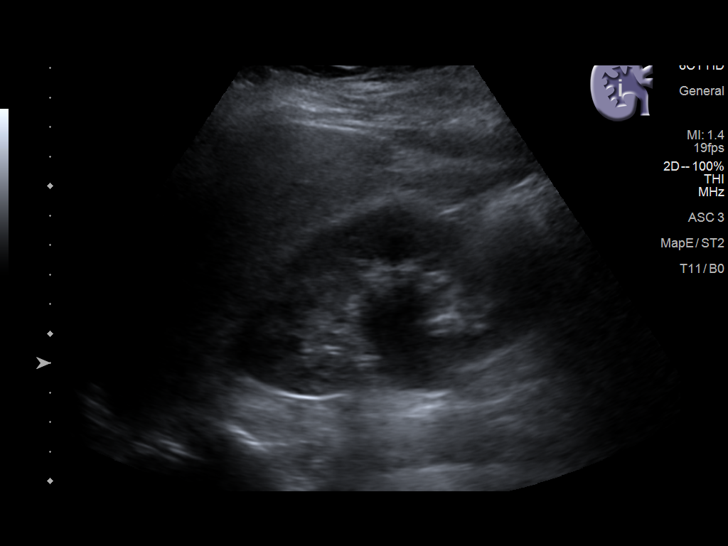
[im 69/83]
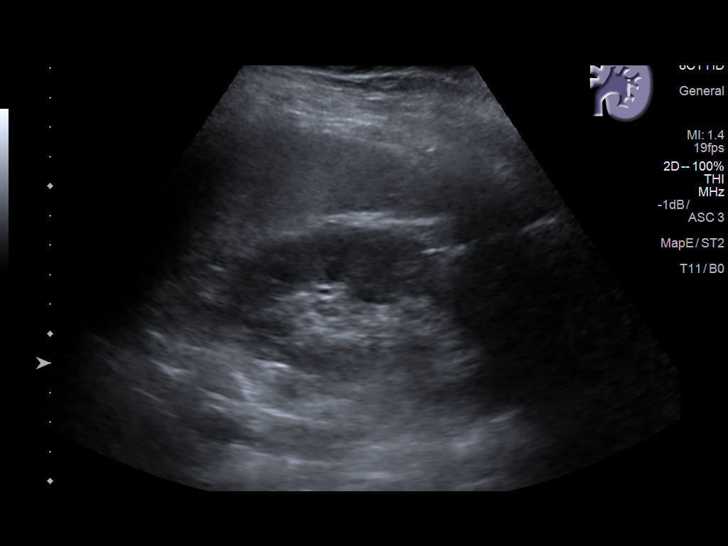
[im 76/83]
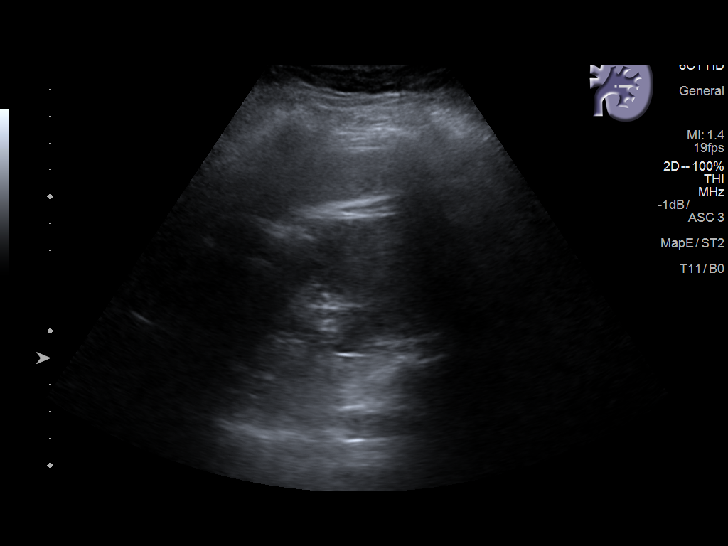
[im 83/83]
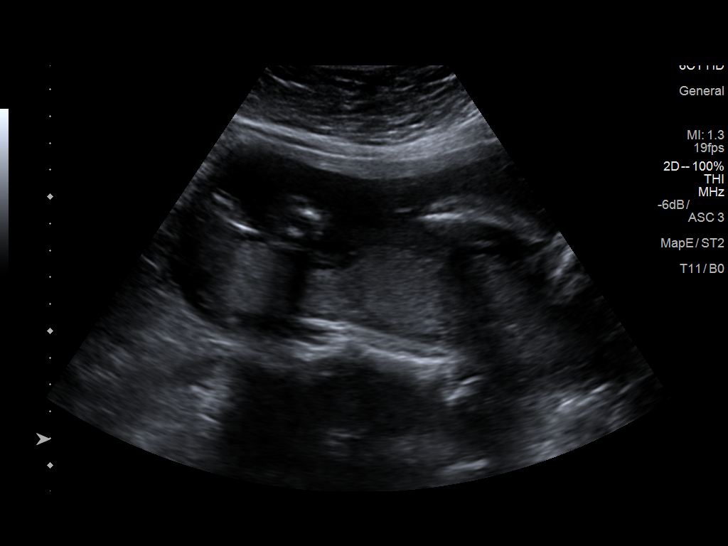

[14 of 25 positions shown; findings below may reference images not displayed]

FINDINGS: Gallbladder: Contracted gallbladder. No secondary signs of
cholecystitis.

Common bile duct: Diameter: 3 mm

Liver: No focal lesion identified. Within normal limits in
parenchymal echogenicity.

IVC: No abnormality visualized.

Pancreas: Obscured by bowel gas.

Spleen: Size and appearance within normal limits.

Right Kidney: Length: 10.9. Echogenicity within normal limits. No
mass. Mild right-sided hydronephrosis.

Left Kidney: Length: 10.9. Echogenicity within normal limits. No
mass or hydronephrosis visualized.

Abdominal aorta: No aneurysm visualized.

Other findings: None.
IMPRESSION: Mild right-sided hydronephrosis probably related to pregnant uterus.

By: Mbongwa Sidelnikov M.D.

## 2017-03-28 ENCOUNTER — Ambulatory Visit (HOSPITAL_COMMUNITY): Payer: Medicaid Other | Admitting: Anesthesiology

## 2017-03-28 ENCOUNTER — Ambulatory Visit (HOSPITAL_COMMUNITY)
Admission: RE | Admit: 2017-03-28 | Discharge: 2017-03-28 | Disposition: A | Discharge status: Home / Self Care | Payer: Medicaid Other | Source: Ambulatory Visit | Attending: Obstetrics & Gynecology | Admitting: Obstetrics & Gynecology

## 2017-03-28 ENCOUNTER — Encounter (HOSPITAL_COMMUNITY)
Admission: RE | Disposition: A | Discharge status: Home / Self Care | Payer: Self-pay | Source: Ambulatory Visit | Attending: Obstetrics & Gynecology

## 2017-03-28 ENCOUNTER — Encounter (HOSPITAL_COMMUNITY): Payer: Self-pay | Admitting: Anesthesiology

## 2017-03-28 DIAGNOSIS — Z87891 Personal history of nicotine dependence: Secondary | ICD-10-CM | POA: Diagnosis not present

## 2017-03-28 DIAGNOSIS — F419 Anxiety disorder, unspecified: Secondary | ICD-10-CM | POA: Diagnosis not present

## 2017-03-28 DIAGNOSIS — N870 Mild cervical dysplasia: Secondary | ICD-10-CM | POA: Diagnosis not present

## 2017-03-28 DIAGNOSIS — R87613 High grade squamous intraepithelial lesion on cytologic smear of cervix (HGSIL): Secondary | ICD-10-CM | POA: Diagnosis not present

## 2017-03-28 DIAGNOSIS — Z6838 Body mass index (BMI) 38.0-38.9, adult: Secondary | ICD-10-CM | POA: Insufficient documentation

## 2017-03-28 DIAGNOSIS — Z8249 Family history of ischemic heart disease and other diseases of the circulatory system: Secondary | ICD-10-CM | POA: Diagnosis not present

## 2017-03-28 HISTORY — PX: CERVICAL ABLATION: SHX5771

## 2017-03-28 SURGERY — ABLATION, CERVIX
Anesthesia: General | Site: Cervix

## 2017-03-28 MED ORDER — DEXAMETHASONE SODIUM PHOSPHATE 4 MG/ML IJ SOLN
4.0000 mg | INTRAMUSCULAR | Status: AC
  Administered 2017-03-28: 4 mg via INTRAVENOUS

## 2017-03-28 MED ORDER — FERRIC SUBSULFATE 259 MG/GM EX SOLN
CUTANEOUS | Status: DC | PRN
Start: 2017-03-28 — End: 2017-03-28
  Administered 2017-03-28: 1

## 2017-03-28 MED ORDER — KETOROLAC TROMETHAMINE 30 MG/ML IJ SOLN
30.0000 mg | Freq: Once | INTRAMUSCULAR | Status: AC
  Administered 2017-03-28: 30 mg via INTRAVENOUS

## 2017-03-28 MED ORDER — LACTATED RINGERS IV SOLN
INTRAVENOUS | Status: DC
  Administered 2017-03-28: 12:00:00 via INTRAVENOUS

## 2017-03-28 MED ORDER — CEFAZOLIN SODIUM-DEXTROSE 2-4 GM/100ML-% IV SOLN
2.0000 g | INTRAVENOUS | Status: AC
  Administered 2017-03-28: 2 g via INTRAVENOUS

## 2017-03-28 MED ORDER — SUCCINYLCHOLINE CHLORIDE 20 MG/ML IJ SOLN
INTRAMUSCULAR | Status: AC
  Filled 2017-03-28: qty 1

## 2017-03-28 MED ORDER — ONDANSETRON HCL 4 MG/2ML IJ SOLN
4.0000 mg | Freq: Once | INTRAMUSCULAR | Status: AC
  Administered 2017-03-28: 4 mg via INTRAVENOUS

## 2017-03-28 MED ORDER — MIDAZOLAM HCL 2 MG/2ML IJ SOLN
1.0000 mg | INTRAMUSCULAR | Status: AC
  Administered 2017-03-28: 2 mg via INTRAVENOUS

## 2017-03-28 MED ORDER — LIDOCAINE HCL (CARDIAC) 20 MG/ML IV SOLN
INTRAVENOUS | Status: DC | PRN
  Administered 2017-03-28: 40 mg via INTRAVENOUS

## 2017-03-28 MED ORDER — FENTANYL CITRATE (PF) 100 MCG/2ML IJ SOLN
25.0000 ug | INTRAMUSCULAR | Status: DC | PRN
  Administered 2017-03-28 (×2): 50 ug via INTRAVENOUS
  Filled 2017-03-28: qty 2

## 2017-03-28 MED ORDER — ACETIC ACID 4% SOLUTION
Status: DC | PRN
  Administered 2017-03-28: 1 via TOPICAL

## 2017-03-28 MED ORDER — CEFAZOLIN SODIUM-DEXTROSE 2-4 GM/100ML-% IV SOLN
INTRAVENOUS | Status: AC
  Filled 2017-03-28: qty 100

## 2017-03-28 MED ORDER — FERRIC SUBSULFATE 259 MG/GM EX SOLN
CUTANEOUS | Status: DC | PRN
  Administered 2017-03-28: 1

## 2017-03-28 MED ORDER — PROPOFOL 10 MG/ML IV BOLUS
INTRAVENOUS | Status: DC | PRN
  Administered 2017-03-28: 30 mg via INTRAVENOUS
  Administered 2017-03-28: 40 mg via INTRAVENOUS
  Administered 2017-03-28: 160 mg via INTRAVENOUS

## 2017-03-28 MED ORDER — DIPHENHYDRAMINE HCL 50 MG/ML IJ SOLN
INTRAMUSCULAR | Status: AC
  Filled 2017-03-28: qty 1

## 2017-03-28 MED ORDER — KETOROLAC TROMETHAMINE 10 MG PO TABS: 10.0000 mg | ORAL_TABLET | Freq: Three times a day (TID) | ORAL | 0 refills | Status: DC | PRN

## 2017-03-28 MED ORDER — DEXAMETHASONE SODIUM PHOSPHATE 4 MG/ML IJ SOLN
INTRAMUSCULAR | Status: AC
  Filled 2017-03-28: qty 1

## 2017-03-28 MED ORDER — FENTANYL CITRATE (PF) 100 MCG/2ML IJ SOLN
INTRAMUSCULAR | Status: DC | PRN
  Administered 2017-03-28: 50 ug via INTRAVENOUS
  Administered 2017-03-28 (×2): 25 ug via INTRAVENOUS

## 2017-03-28 MED ORDER — HYDROCODONE-ACETAMINOPHEN 5-325 MG PO TABS: 1.0000 | ORAL_TABLET | Freq: Four times a day (QID) | ORAL | 0 refills | Status: DC | PRN

## 2017-03-28 MED ORDER — ONDANSETRON HCL 4 MG/2ML IJ SOLN
INTRAMUSCULAR | Status: AC
  Filled 2017-03-28: qty 2

## 2017-03-28 MED ORDER — FENTANYL CITRATE (PF) 100 MCG/2ML IJ SOLN
INTRAMUSCULAR | Status: AC
  Filled 2017-03-28: qty 2

## 2017-03-28 MED ORDER — KETOROLAC TROMETHAMINE 30 MG/ML IJ SOLN
INTRAMUSCULAR | Status: AC
  Filled 2017-03-28: qty 1

## 2017-03-28 MED ORDER — LIDOCAINE HCL (PF) 1 % IJ SOLN
INTRAMUSCULAR | Status: AC
  Filled 2017-03-28: qty 5

## 2017-03-28 MED ORDER — PROPOFOL 10 MG/ML IV BOLUS
INTRAVENOUS | Status: AC
  Filled 2017-03-28: qty 20

## 2017-03-28 MED ORDER — MIDAZOLAM HCL 2 MG/2ML IJ SOLN
INTRAMUSCULAR | Status: AC
  Filled 2017-03-28: qty 2

## 2017-03-28 MED ORDER — DIPHENHYDRAMINE HCL 50 MG/ML IJ SOLN
25.0000 mg | Freq: Once | INTRAMUSCULAR | Status: AC
  Administered 2017-03-28: 25 mg via INTRAVENOUS

## 2017-03-28 MED ORDER — FERRIC SUBSULFATE 259 MG/GM EX SOLN
CUTANEOUS | Status: AC
  Filled 2017-03-28: qty 8

## 2017-03-28 MED ORDER — ONDANSETRON 8 MG PO TBDP: 8.0000 mg | ORAL_TABLET | Freq: Three times a day (TID) | ORAL | 0 refills | Status: DC | PRN

## 2017-03-28 SURGICAL SUPPLY — 23 items
APPLICATOR COTTON TIP 6IN STRL (MISCELLANEOUS) ×4 IMPLANT
BAG HAMPER (MISCELLANEOUS) ×2 IMPLANT
CLOTH BEACON ORANGE TIMEOUT ST (SAFETY) ×2 IMPLANT
COVER LIGHT HANDLE STERIS (MISCELLANEOUS) ×4 IMPLANT
COVER TABLE BACK 60X90 (DRAPES) ×2 IMPLANT
GAUZE SPONGE 4X4 16PLY XRAY LF (GAUZE/BANDAGES/DRESSINGS) ×2 IMPLANT
GLOVE BIOGEL PI IND STRL 7.0 (GLOVE) ×1 IMPLANT
GLOVE BIOGEL PI IND STRL 8 (GLOVE) ×1 IMPLANT
GLOVE BIOGEL PI INDICATOR 7.0 (GLOVE) ×1
GLOVE BIOGEL PI INDICATOR 8 (GLOVE) ×1
GLOVE ECLIPSE 8.0 STRL XLNG CF (GLOVE) ×2 IMPLANT
GOWN STRL REUS W/TWL LRG LVL3 (GOWN DISPOSABLE) ×2 IMPLANT
GOWN STRL REUS W/TWL XL LVL3 (GOWN DISPOSABLE) ×2 IMPLANT
KIT ROOM TURNOVER APOR (KITS) ×2 IMPLANT
LASER FIBER DISP 1000U (UROLOGICAL SUPPLIES) ×2 IMPLANT
MANIFOLD NEPTUNE II (INSTRUMENTS) ×2 IMPLANT
MARKER SKIN DUAL TIP RULER LAB (MISCELLANEOUS) ×2 IMPLANT
PAD ARMBOARD 7.5X6 YLW CONV (MISCELLANEOUS) ×2 IMPLANT
PREFILTER SMOKE EVAC (FILTER) ×2 IMPLANT
SET BASIN LINEN APH (SET/KITS/TRAYS/PACK) ×2 IMPLANT
SWAB PROCTOSCOPIC (MISCELLANEOUS) ×12 IMPLANT
TUBING SMOKE EVAC CO2 (TUBING) ×2 IMPLANT
WATER STERILE IRR 1000ML POUR (IV SOLUTION) ×4 IMPLANT

## 2017-03-28 NOTE — Op Note (Signed)
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  Pathology report returned as high Grade SIL.  The colposcopy was adequate.  As a result, the patient is admitted for laser ablation of the cervix.  Description of Note:  Patient was taken to the OR and placed in the supine position where she underwent laryngeal mask airway anaesthesia.  She was placed in the dorsal lithotomy position.  She was draped for laser.  Graves speculum was placed and 3% acetic acid used and the laser microscope employed to perform colposcopy which confirmed the office findings.  Laser was used on typical cervical settings and used to vaporized the squamocolumnar junction to  depth of  5-7 mm peripherally and 7-9 mm centrally.  Surgical margin of several mm was employed beyond the acetowhite epithelium.  Hemostasis was achieved with the laser and Monsel's solution.  Patient was awakened from anaesthesia in good stable condition and all counts were correct.  She received Ancef 2 gram and Toradol 30 mg IV preoperatively prophylactically.  Lazaro ArmsLuther H Khalib Fendley, MD 03/28/2017 1:58 PM

## 2017-03-28 NOTE — Transfer of Care (Signed)
Immediate Anesthesia Transfer of Care Note  Patient: Shawnie PonsRachel Dworkin  Procedure(s) Performed: Laser Ablation of Cervix (N/A Cervix)  Patient Location: PACU  Anesthesia Type:General  Level of Consciousness: awake, alert , oriented and patient cooperative  Airway & Oxygen Therapy: Patient Spontanous Breathing  Post-op Assessment: Report given to RN and Post -op Vital signs reviewed and stable  Post vital signs: Reviewed and stable  Last Vitals:  Vitals:   03/28/17 1245 03/28/17 1250  BP: 105/68 109/62  Pulse:    Resp: (!) 21 (!) 21  Temp:    SpO2: 98% 100%    Last Pain:  Vitals:   03/28/17 1217  TempSrc: Oral      Patients Stated Pain Goal: 5 (03/28/17 1217)  Complications: No apparent anesthesia complications

## 2017-03-28 NOTE — Anesthesia Postprocedure Evaluation (Signed)
Anesthesia Post Note  Patient: Samantha Sandoval  Procedure(s) Performed: Laser Ablation of Cervix (N/A Cervix)  Patient location during evaluation: Other Anesthesia Type: General Level of consciousness: awake and alert, oriented and patient cooperative Pain management: pain level controlled Vital Signs Assessment: post-procedure vital signs reviewed and stable Respiratory status: spontaneous breathing Cardiovascular status: stable Postop Assessment: no apparent nausea or vomiting Anesthetic complications: no     Last Vitals:  Vitals:   03/28/17 1400 03/28/17 1420  BP: 117/78 116/80  Pulse: 72 79  Resp: 16 18  Temp:  36.8 C  SpO2: 94% 95%    Last Pain:  Vitals:   03/28/17 1420  TempSrc: Oral  PainSc:                  Gearlene Godsil A

## 2017-03-28 NOTE — H&P (Signed)
Preoperative History and Physical  Samantha Sandoval is a 22 y.o. G1P1001 with Patient's last menstrual period was 01/25/2017. 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    PMH:        Past Medical History:  Diagnosis Date  . Anxiety   . Vaginal Pap smear, abnormal     PSH:          Past Surgical History:  Procedure Laterality Date  . ABDOMINAL SURGERY    . tubes in ears    . tumor 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      POb/GynH:              OB History    Gravida Para Term Preterm AB Living   1 1 1     1    SAB TAB Ectopic Multiple Live Births         0 1      SH:   Social History        Tobacco Use  . Smoking status: Former Smoker    Packs/day: 0.25    Types: Cigarettes  . Smokeless tobacco: Never Used  Substance Use Topics  . Alcohol use: No    Comment: none  . Drug use: No    Comment: denies    FH:         Family History  Problem Relation Age of Onset  . Diabetes Maternal Grandmother   . Hypertension Maternal Grandmother   . COPD Maternal Grandmother   . Scoliosis Maternal Grandmother   . Heart disease Maternal Grandmother   . Hypertension Maternal Grandfather   . Other Maternal Grandfather        2 stents in heart  . Heart attack Paternal Grandfather   . Congestive Heart Failure Neg Hx      Allergies: No Known Allergies  Medications:      No current outpatient medications on file.  Review of Systems:   Review of Systems  Constitutional: Negative for fever, chills, weight loss, malaise/fatigue and diaphoresis.  HENT: Negative for hearing loss, ear pain, nosebleeds, congestion, sore throat, neck pain, tinnitus and ear discharge.   Eyes: Negative for blurred vision, double vision, photophobia, pain, discharge and redness.  Respiratory: Negative for cough, hemoptysis, sputum production, shortness of breath, wheezing and  stridor.   Cardiovascular: Negative for chest pain, palpitations, orthopnea, claudication, leg swelling and PND.  Gastrointestinal: Positive for abdominal pain. Negative for heartburn, nausea, vomiting, diarrhea, constipation, blood in stool and melena.  Genitourinary: Negative for dysuria, urgency, frequency, hematuria and flank pain.  Musculoskeletal: Negative for myalgias, back pain, joint pain and falls.  Skin: Negative for itching and rash.  Neurological: Negative for dizziness, tingling, tremors, sensory change, speech change, focal weakness, seizures, loss of consciousness, weakness and headaches.  Endo/Heme/Allergies: Negative for environmental allergies and polydipsia. Does not bruise/bleed easily.  Psychiatric/Behavioral: Negative for depression, suicidal ideas, hallucinations, memory loss and substance abuse. The patient is not nervous/anxious and does not have insomnia.      PHYSICAL EXAM:  Blood pressure (!) 92/58, pulse (!) 110, height 5\' 3"  (1.6 m), weight 220 lb (99.8 kg), last menstrual period 01/25/2017, not currently breastfeeding.    Vitals reviewed. Constitutional: She is oriented to person, place, and time. She appears well-developed and well-nourished.  HENT:  Head: Normocephalic and atraumatic.  Right Ear: External ear normal.  Left Ear: External ear normal.  Nose: Nose normal.  Mouth/Throat: Oropharynx is  clear and moist.  Eyes: Conjunctivae and EOM are normal. Pupils are equal, round, and reactive to light. Right eye exhibits no discharge. Left eye exhibits no discharge. No scleral icterus.  Neck: Normal range of motion. Neck supple. No tracheal deviation present. No thyromegaly present.  Cardiovascular: Normal rate, regular rhythm, normal heart sounds and intact distal pulses.  Exam reveals no gallop and no friction rub.   No murmur heard. Respiratory: Effort normal and breath sounds normal. No respiratory distress. She has no wheezes. She has no rales.  She exhibits no tenderness.  GI: Soft. Bowel sounds are normal. She exhibits no distension and no mass. There is tenderness. There is no rebound and no guarding.  Genitourinary:       Vulva is normal without lesions Vagina is pink moist without discharge Cervix per colposcopy report Uterus is normal size, contour, position, consistency, mobility, non-tender Adnexa is negative with normal sized ovaries by sonogram  Musculoskeletal: Normal range of motion. She exhibits no edema and no tenderness.  Neurological: She is alert and oriented to person, place, and time. She has normal reflexes. She displays normal reflexes. No cranial nerve deficit. She exhibits normal muscle tone. Coordination normal.  Skin: Skin is warm and dry. No rash noted. No erythema. No pallor.  Psychiatric: She has a normal mood and affect. Her behavior is normal. Judgment and thought content normal.    Labs:      Results for orders placed or performed in visit on 01/29/17 (from the past 336 hour(s))  POCT urine pregnancy   Collection Time: 01/29/17  9:41 AM  Result Value Ref Range   Preg Test, Ur Negative Negative   Results for orders placed or performed during the hospital encounter of 03/22/17 (from the past 336 hour(s))  CBC   Collection Time: 03/22/17  7:50 AM  Result Value Ref Range   WBC 7.0 4.0 - 10.5 K/uL   RBC 4.71 3.87 - 5.11 MIL/uL   Hemoglobin 12.9 12.0 - 15.0 g/dL   HCT 40.340.6 47.436.0 - 25.946.0 %   MCV 86.2 78.0 - 100.0 fL   MCH 27.4 26.0 - 34.0 pg   MCHC 31.8 30.0 - 36.0 g/dL   RDW 56.313.2 87.511.5 - 64.315.5 %   Platelets 256 150 - 400 K/uL  Comprehensive metabolic panel   Collection Time: 03/22/17  7:50 AM  Result Value Ref Range   Sodium 136 135 - 145 mmol/L   Potassium 3.8 3.5 - 5.1 mmol/L   Chloride 106 101 - 111 mmol/L   CO2 22 22 - 32 mmol/L   Glucose, Bld 99 65 - 99 mg/dL   BUN 9 6 - 20 mg/dL   Creatinine, Ser 3.290.74 0.44 - 1.00 mg/dL   Calcium 8.8 (L) 8.9 - 10.3 mg/dL   Total Protein 7.0 6.5 -  8.1 g/dL   Albumin 3.9 3.5 - 5.0 g/dL   AST 15 15 - 41 U/L   ALT 17 14 - 54 U/L   Alkaline Phosphatase 87 38 - 126 U/L   Total Bilirubin 0.5 0.3 - 1.2 mg/dL   GFR calc non Af Amer >60 >60 mL/min   GFR calc Af Amer >60 >60 mL/min   Anion gap 8 5 - 15  hCG, quantitative, pregnancy   Collection Time: 03/22/17  7:50 AM  Result Value Ref Range   hCG, Beta Chain, Quant, S <1 <5 mIU/mL  Rapid HIV screen (HIV 1/2 Ab+Ag)   Collection Time: 03/22/17  7:50 AM  Result Value Ref  Range   HIV-1 P24 Antigen - HIV24 NON REACTIVE NON REACTIVE   HIV 1/2 Antibodies NON REACTIVE NON REACTIVE   Interpretation (HIV Ag Ab)      A non reactive test result means that HIV 1 or HIV 2 antibodies and HIV 1 p24 antigen were not detected in the specimen.  Urinalysis, Routine w reflex microscopic   Collection Time: 03/22/17  7:51 AM  Result Value Ref Range   Color, Urine YELLOW YELLOW   APPearance HAZY (A) CLEAR   Specific Gravity, Urine 1.031 (H) 1.005 - 1.030   pH 5.0 5.0 - 8.0   Glucose, UA NEGATIVE NEGATIVE mg/dL   Hgb urine dipstick NEGATIVE NEGATIVE   Bilirubin Urine NEGATIVE NEGATIVE   Ketones, ur 5 (A) NEGATIVE mg/dL   Protein, ur NEGATIVE NEGATIVE mg/dL   Nitrite NEGATIVE NEGATIVE   Leukocytes, UA TRACE (A) NEGATIVE   RBC / HPF 6-30 0 - 5 RBC/hpf   WBC, UA 6-30 0 - 5 WBC/hpf   Bacteria, UA RARE (A) NONE SEEN   Squamous Epithelial / LPF 0-5 (A) NONE SEEN   Mucus PRESENT      EKG:    Orders placed or performed during the hospital encounter of 04/22/16  . ED EKG  . ED EKG    Imaging Studies: ImagingResults  No results found.      Assessment: HSIL of the cervix, large expansive lesion   Plan: Laser ablation of the cervix 03/14/2017  Pt understands the risk of cervical stenosis and incompetence  Lazaro Arms 02/09/2017 9:30 AM      All questions were answered.

## 2017-03-28 NOTE — Interval H&P Note (Signed)
History and Physical Interval Note:  03/28/2017 12:29 PM  Samantha Sandoval  has presented today for surgery, with the diagnosis of CERVICAL DYSPLASIA  The various methods of treatment have been discussed with the patient and family. After consideration of risks, benefits and other options for treatment, the patient has consented to  Procedure(s): Laser Ablation of Cervix (N/A) as a surgical intervention .  The patient's history has been reviewed, patient examined, no change in status, stable for surgery.  I have reviewed the patient's chart and labs.  Questions were answered to the patient's satisfaction.     Lazaro ArmsLuther H Burnette Valenti

## 2017-03-28 NOTE — Anesthesia Procedure Notes (Addendum)
Procedure Name: LMA Insertion Date/Time: 03/28/2017 1:02 PM Performed by: Pernell DupreAdams, Mable Lashley A, CRNA Pre-anesthesia Checklist: Patient identified, Timeout performed, Emergency Drugs available, Suction available and Patient being monitored Patient Re-evaluated:Patient Re-evaluated prior to induction Oxygen Delivery Method: Circle system utilized Preoxygenation: Pre-oxygenation with 100% oxygen Induction Type: IV induction Ventilation: Mask ventilation without difficulty LMA: LMA inserted LMA Size: 3.0 Number of attempts: 1 Placement Confirmation: positive ETCO2 and breath sounds checked- equal and bilateral Tube secured with: Tape Dental Injury: Teeth and Oropharynx as per pre-operative assessment

## 2017-03-28 NOTE — Anesthesia Preprocedure Evaluation (Signed)
Anesthesia Evaluation  Patient identified by MRN, date of birth, ID band Patient awake    Reviewed: Allergy & Precautions, NPO status , Patient's Chart, lab work & pertinent test results  Airway Mallampati: II  TM Distance: >3 FB Neck ROM: Full    Dental  (+) Teeth Intact   Pulmonary Current Smoker,    breath sounds clear to auscultation       Cardiovascular negative cardio ROS   Rhythm:Regular Rate:Normal     Neuro/Psych PSYCHIATRIC DISORDERS Anxiety negative neurological ROS     GI/Hepatic negative GI ROS, Neg liver ROS,   Endo/Other  negative endocrine ROSMorbid obesity  Renal/GU negative Renal ROS     Musculoskeletal negative musculoskeletal ROS (+)   Abdominal   Peds  Hematology negative hematology ROS (+)   Anesthesia Other Findings   Reproductive/Obstetrics                             Anesthesia Physical Anesthesia Plan  ASA: II  Anesthesia Plan: General   Post-op Pain Management:    Induction: Intravenous  PONV Risk Score and Plan:   Airway Management Planned: LMA  Additional Equipment:   Intra-op Plan:   Post-operative Plan: Extubation in OR  Informed Consent: I have reviewed the patients History and Physical, chart, labs and discussed the procedure including the risks, benefits and alternatives for the proposed anesthesia with the patient or authorized representative who has indicated his/her understanding and acceptance.     Plan Discussed with:   Anesthesia Plan Comments:         Anesthesia Quick Evaluation

## 2017-03-28 NOTE — Discharge Instructions (Signed)
Cervical Laser Surgery, Care After °This sheet gives you information about how to care for yourself after your procedure. Your health care provider may also give you more specific instructions. If you have problems or questions, contact your health care provider. °What can I expect after the procedure? °After the procedure, it is common to have: °· Pain or discomfort. °· Mild cramping. °· Bleeding, spotting, or brownish discharge from your vagina. ° °Follow these instructions at home: °Activity °· Return to your normal activities as told by your health care provider. Ask your health care provider what activities are safe for you. °· Do not lift anything that is heavier than 10 lb (4.5 kg), or the limit that your health care provider tells you, until he or she says that it is safe. °· Do not have sex or put anything in your vagina until your health care provider says it is okay. °General instructions °· Take over-the-counter and prescription medicines only as told by your health care provider. °· Do not drive or use heavy machinery while taking prescription pain medicine. °· Wear sanitary pads to protect from bleeding, spotting, and discharge. °· Do not use tampons or douche until your health care provider says it is okay. °· It is up to you to get the results of your procedure. Ask your health care provider, or the department that is doing the procedure, when your results will be ready. °· Keep all follow-up visits as told by your health care provider. This is important. °Contact a health care provider if: °· Your pain or cramping does not improve. °· Your periods are more painful than usual. °· You do not get your period as expected. °Get help right away if: °· You have any symptoms of infection, such as: °? A fever. °? Chills. °? Discharge that smells bad. °· You have severe pain in your abdomen. °· You have heavy bleeding from your vagina (more than a normal period). °· You have vaginal bleeding with clumps of  blood (blood clots). °Summary °· After this procedure, it is common to have pain or discomfort and mild cramping. It is also common to have bleeding, spotting, or brownish discharge from your vagina. °· You may need to wear sanitary pads to protect from bleeding, spotting, and discharge. °· Do not have sex, use tampons, or douche until your health care provider says it is okay. °· Return to your normal activities as told by your health care provider. Ask your health care provider what activities are safe for you. °· Take over-the-counter and prescription medicines only as told by your health care provider. These include medicines for pain. °This information is not intended to replace advice given to you by your health care provider. Make sure you discuss any questions you have with your health care provider. °Document Released: 01/10/2016 Document Revised: 01/10/2016 Document Reviewed: 01/10/2016 °Elsevier Interactive Patient Education © 2018 [employer] ° °

## 2017-03-29 NOTE — Addendum Note (Signed)
Addendum  created 03/29/17 0726 by Laurene FootmanGonzalez, Dary Dilauro, MD   Intraprocedure Attestations filed

## 2017-03-30 ENCOUNTER — Encounter (HOSPITAL_COMMUNITY): Payer: Self-pay | Admitting: Obstetrics & Gynecology

## 2017-04-09 ENCOUNTER — Encounter: Payer: Self-pay | Admitting: Obstetrics & Gynecology

## 2017-04-09 ENCOUNTER — Ambulatory Visit (INDEPENDENT_AMBULATORY_CARE_PROVIDER_SITE_OTHER): Payer: BLUE CROSS/BLUE SHIELD | Admitting: Obstetrics & Gynecology

## 2017-04-09 VITALS — BP 100/68 | HR 86 | Ht 63.0 in | Wt 213.0 lb

## 2017-04-09 DIAGNOSIS — Z9889 Other specified postprocedural states: Secondary | ICD-10-CM

## 2017-04-09 DIAGNOSIS — Z01818 Encounter for other preprocedural examination: Secondary | ICD-10-CM

## 2017-04-09 MED ORDER — HYDROCODONE-ACETAMINOPHEN 5-325 MG PO TABS: 1.0000 | ORAL_TABLET | Freq: Four times a day (QID) | ORAL | 0 refills | Status: DC | PRN

## 2017-04-09 NOTE — Progress Notes (Signed)
  HPI: Patient returns for routine postoperative follow-up having undergone laser ablation of the cervix on 03/28/2017.  The patient's immediate postoperative recovery has been unremarkable. Since hospital discharge the patient reports blackish discharge.   Current Outpatient Medications: ketorolac (TORADOL) 10 MG tablet, Take 1 tablet (10 mg total) by mouth every 8 (eight) hours as needed., Disp: 15 tablet, Rfl: 0 ondansetron (ZOFRAN ODT) 8 MG disintegrating tablet, Take 1 tablet (8 mg total) by mouth every 8 (eight) hours as needed for nausea or vomiting., Disp: 20 tablet, Rfl: 0 HYDROcodone-acetaminophen (NORCO/VICODIN) 5-325 MG tablet, Take 1 tablet by mouth every 6 (six) hours as needed. (Patient not taking: Reported on 04/09/2017), Disp: 12 tablet, Rfl: 0  No current facility-administered medications for this visit.     Blood pressure 100/68, pulse 86, height 5\' 3"  (1.6 m), weight 213 lb (96.6 kg), last menstrual period 03/28/2017, not currently breastfeeding.  Physical Exam: Cervical laser bed is healing well no blood seen, some blackish discharge  Diagnostic Tests:   Pathology: n/a  Impression: S/p laser ablation of the cervix for treatement of HSIL, large lesion  Plan: No sex for 4 weeks  Follow up: 4  weeks  Lazaro ArmsLuther H Ivelis Norgard, MD

## 2017-05-07 ENCOUNTER — Encounter: Payer: Self-pay | Admitting: Obstetrics & Gynecology

## 2017-05-07 ENCOUNTER — Ambulatory Visit: Payer: Medicaid Other | Admitting: Obstetrics & Gynecology

## 2017-05-07 VITALS — BP 100/60 | Wt 211.0 lb

## 2017-05-07 DIAGNOSIS — Z9889 Other specified postprocedural states: Secondary | ICD-10-CM

## 2017-05-07 NOTE — Progress Notes (Signed)
  HPI: Patient returns for routine postoperative follow-up having undergone laser ablatio of the cervix on 03/28/2017.  The patient's immediate postoperative recovery has been unremarkable. Since hospital discharge the patient reports no problems has had sex already.   Current Outpatient Medications: HYDROcodone-acetaminophen (NORCO/VICODIN) 5-325 MG tablet, Take 1 tablet by mouth every 6 (six) hours as needed. (Patient not taking: Reported on 05/07/2017), Disp: 12 tablet, Rfl: 0 ketorolac (TORADOL) 10 MG tablet, Take 1 tablet (10 mg total) by mouth every 8 (eight) hours as needed. (Patient not taking: Reported on 05/07/2017), Disp: 15 tablet, Rfl: 0 ondansetron (ZOFRAN ODT) 8 MG disintegrating tablet, Take 1 tablet (8 mg total) by mouth every 8 (eight) hours as needed for nausea or vomiting. (Patient not taking: Reported on 05/07/2017), Disp: 20 tablet, Rfl: 0  No current facility-administered medications for this visit.     Blood pressure 100/60, weight 211 lb (95.7 kg), not currently breastfeeding.  Physical Exam: Cervix is perfectly healed with a little granulation tissue in the endocervix  Diagnostic Tests:   Pathology: n/a  Impression: S/P laser ablation of the cervix for HSIL, large lesion  Plan: No restricitons  Follow up: 6  months follow up Pap  Lazaro ArmsLuther H Bianney Rockwood, MD

## 2017-05-16 ENCOUNTER — Ambulatory Visit (INDEPENDENT_AMBULATORY_CARE_PROVIDER_SITE_OTHER): Payer: Medicaid Other | Admitting: Adult Health

## 2017-05-16 ENCOUNTER — Encounter: Payer: Self-pay | Admitting: Adult Health

## 2017-05-16 VITALS — BP 124/62 | HR 90 | Ht 63.0 in | Wt 210.8 lb

## 2017-05-16 DIAGNOSIS — Z3201 Encounter for pregnancy test, result positive: Secondary | ICD-10-CM | POA: Diagnosis not present

## 2017-05-16 DIAGNOSIS — N926 Irregular menstruation, unspecified: Secondary | ICD-10-CM | POA: Diagnosis not present

## 2017-05-16 DIAGNOSIS — O3680X0 Pregnancy with inconclusive fetal viability, not applicable or unspecified: Secondary | ICD-10-CM

## 2017-05-16 DIAGNOSIS — Z3490 Encounter for supervision of normal pregnancy, unspecified, unspecified trimester: Secondary | ICD-10-CM

## 2017-05-16 LAB — POCT URINE PREGNANCY: Preg Test, Ur: POSITIVE — AB

## 2017-05-16 MED ORDER — PRENATAL PLUS 27-1 MG PO TABS: 1.0000 | ORAL_TABLET | Freq: Every day | ORAL | 12 refills | Status: DC

## 2017-05-16 NOTE — Progress Notes (Signed)
Subjective:     Patient ID: Samantha PonsRachel Liska, female   DOB: 01/01/1996, 22 y.o.   MRN: [id number]  HPI Fleet ContrasRachel is a 22 year old white female in for UPT, she has missed a period and had 4 +HPTs.She had laser ablation of her cervix, 03/28/17 and was on her period then, and spotted 2/14 and 2/15.   Review of Systems +missed period. +HPTs Reviewed past medical,surgical, social and family history. Reviewed medications and allergies.     Objective:   Physical Exam BP 124/62 (BP Location: Left Arm, Patient Position: Sitting, Cuff Size: Normal)   Pulse 90   Ht 5\' 3"  (1.6 m)   Wt 210 lb 12.8 oz (95.6 kg)   LMP 03/28/2017 (Approximate)   BMI 37.34 kg/m UPT +about 7 weeks by LMP with EDD 01/02/18.Skin warm and dry. Neck: mid line trachea, normal thyroid, good ROM, no lymphadenopathy noted. Lungs: clear to ausculation bilaterally. Cardiovascular: regular rate and rhythm.Abdomen  is soft and non tender.    Assessment:     1. Pregnancy test positive   2. Pregnancy, unspecified gestational age   883. Encounter to determine fetal viability of pregnancy, single or unspecified fetus       Plan:     Meds ordered this encounter  Medications  . prenatal vitamin w/FE, FA (PRENATAL 1 + 1) 27-1 MG TABS tablet    Sig: Take 1 tablet by mouth daily at 12 noon.    Dispense:  30 each    Refill:  12    Order Specific Question:   Supervising Provider    Answer:   Lazaro ArmsEURE, LUTHER H [[id number]]  Return in 1 week for dating US Review handouts on First trimester and by [facility]

## 2017-05-16 NOTE — Patient Instructions (Signed)
First Trimester of Pregnancy The first trimester of pregnancy is from week 1 until the end of week 13 (months 1 through 3). A week after a sperm fertilizes an egg, the egg will implant on the wall of the uterus. This embryo will begin to develop into a baby. Genes from you and your partner will form the baby. The female genes will determine whether the baby will be a boy or a girl. At 6-8 weeks, the eyes and face will be formed, and the heartbeat can be seen on ultrasound. At the end of 12 weeks, all the baby's organs will be formed. Now that you are pregnant, you will want to do everything you can to have a healthy baby. Two of the most important things are to get good prenatal care and to follow your health care provider's instructions. Prenatal care is all the medical care you receive before the baby's birth. This care will help prevent, find, and treat any problems during the pregnancy and childbirth. Body changes during your first trimester Your body goes through many changes during pregnancy. The changes vary from woman to woman.  You may gain or lose a couple of pounds at first.  You may feel sick to your stomach (nauseous) and you may throw up (vomit). If the vomiting is uncontrollable, call your health care provider.  You may tire easily.  You may develop headaches that can be relieved by medicines. All medicines should be approved by your health care provider.  You may urinate more often. Painful urination may mean you have a bladder infection.  You may develop heartburn as a result of your pregnancy.  You may develop constipation because certain hormones are causing the muscles that push stool through your intestines to slow down.  You may develop hemorrhoids or swollen veins (varicose veins).  Your breasts may begin to grow larger and become tender. Your nipples may stick out more, and the tissue that surrounds them (areola) may become darker.  Your gums may bleed and may be  sensitive to brushing and flossing.  Dark spots or blotches (chloasma, mask of pregnancy) may develop on your face. This will likely fade after the baby is born.  Your menstrual periods will stop.  You may have a loss of appetite.  You may develop cravings for certain kinds of food.  You may have changes in your emotions from day to day, such as being excited to be pregnant or being concerned that something may go wrong with the pregnancy and baby.  You may have more vivid and strange dreams.  You may have changes in your hair. These can include thickening of your hair, rapid growth, and changes in texture. Some women also have hair loss during or after pregnancy, or hair that feels dry or thin. Your hair will most likely return to normal after your baby is born.  What to expect at prenatal visits During a routine prenatal visit:  You will be weighed to make sure you and the baby are growing normally.  Your blood pressure will be taken.  Your abdomen will be measured to track your baby's growth.  The fetal heartbeat will be listened to between weeks 10 and 14 of your pregnancy.  Test results from any previous visits will be discussed.  Your health care provider may ask you:  How you are feeling.  If you are feeling the baby move.  If you have had any abnormal symptoms, such as leaking fluid, bleeding, severe headaches,   or abdominal cramping.  If you are using any tobacco products, including cigarettes, chewing tobacco, and electronic cigarettes.  If you have any questions.  Other tests that may be performed during your first trimester include:  Blood tests to find your blood type and to check for the presence of any previous infections. The tests will also be used to check for low iron levels (anemia) and protein on red blood cells (Rh antibodies). Depending on your risk factors, or if you previously had diabetes during pregnancy, you may have tests to check for high blood  sugar that affects pregnant women (gestational diabetes).  Urine tests to check for infections, diabetes, or protein in the urine.  An ultrasound to confirm the proper growth and development of the baby.  Fetal screens for spinal cord problems (spina bifida) and Down syndrome.  HIV (human immunodeficiency virus) testing. Routine prenatal testing includes screening for HIV, unless you choose not to have this test.  You may need other tests to make sure you and the baby are doing well.  Follow these instructions at home: Medicines  Follow your health care provider's instructions regarding medicine use. Specific medicines may be either safe or unsafe to take during pregnancy.  Take a prenatal vitamin that contains at least 600 micrograms (mcg) of folic acid.  If you develop constipation, try taking a stool softener if your health care provider approves. Eating and drinking  Eat a balanced diet that includes fresh fruits and vegetables, whole grains, good sources of protein such as meat, eggs, or tofu, and low-fat dairy. Your health care provider will help you determine the amount of weight gain that is right for you.  Avoid raw meat and uncooked cheese. These carry germs that can cause birth defects in the baby.  Eating four or five small meals rather than three large meals a day may help relieve nausea and vomiting. If you start to feel nauseous, eating a few soda crackers can be helpful. Drinking liquids between meals, instead of during meals, also seems to help ease nausea and vomiting.  Limit foods that are high in fat and processed sugars, such as fried and sweet foods.  To prevent constipation: ? Eat foods that are high in fiber, such as fresh fruits and vegetables, whole grains, and beans. ? Drink enough fluid to keep your urine clear or pale yellow. Activity  Exercise only as directed by your health care provider. Most women can continue their usual exercise routine during  pregnancy. Try to exercise for 30 minutes at least 5 days a week. Exercising will help you: ? Control your weight. ? Stay in shape. ? Be prepared for labor and delivery.  Experiencing pain or cramping in the lower abdomen or lower back is a good sign that you should stop exercising. Check with your health care provider before continuing with normal exercises.  Try to avoid standing for long periods of time. Move your legs often if you must stand in one place for a long time.  Avoid heavy lifting.  Wear low-heeled shoes and practice good posture.  You may continue to have sex unless your health care provider tells you not to. Relieving pain and discomfort  Wear a good support bra to relieve breast tenderness.  Take warm sitz baths to soothe any pain or discomfort caused by hemorrhoids. Use hemorrhoid cream if your health care provider approves.  Rest with your legs elevated if you have leg cramps or low back pain.  If you develop   varicose veins in your legs, wear support hose. Elevate your feet for 15 minutes, 3-4 times a day. Limit salt in your diet. Prenatal care  Schedule your prenatal visits by the twelfth week of pregnancy. They are usually scheduled monthly at first, then more often in the last 2 months before delivery.  Write down your questions. Take them to your prenatal visits.  Keep all your prenatal visits as told by your health care provider. This is important. Safety  Wear your seat belt at all times when driving.  Make a list of emergency phone numbers, including numbers for family, friends, the hospital, and police and fire departments. General instructions  Ask your health care provider for a referral to a local prenatal education class. Begin classes no later than the beginning of month 6 of your pregnancy.  Ask for help if you have counseling or nutritional needs during pregnancy. Your health care provider can offer advice or refer you to specialists for help  with various needs.  Do not use hot tubs, steam rooms, or saunas.  Do not douche or use tampons or scented sanitary pads.  Do not cross your legs for long periods of time.  Avoid cat litter boxes and soil used by cats. These carry germs that can cause birth defects in the baby and possibly loss of the fetus by miscarriage or stillbirth.  Avoid all smoking, herbs, alcohol, and medicines not prescribed by your health care provider. Chemicals in these products affect the formation and growth of the baby.  Do not use any products that contain nicotine or tobacco, such as cigarettes and e-cigarettes. If you need help quitting, ask your health care provider. You may receive counseling support and other resources to help you quit.  Schedule a dentist appointment. At home, brush your teeth with a soft toothbrush and be gentle when you floss. Contact a health care provider if:  You have dizziness.  You have mild pelvic cramps, pelvic pressure, or nagging pain in the abdominal area.  You have persistent nausea, vomiting, or diarrhea.  You have a bad smelling vaginal discharge.  You have pain when you urinate.  You notice increased swelling in your face, hands, legs, or ankles.  You are exposed to fifth disease or chickenpox.  You are exposed to German measles (rubella) and have never had it. Get help right away if:  You have a fever.  You are leaking fluid from your vagina.  You have spotting or bleeding from your vagina.  You have severe abdominal cramping or pain.  You have rapid weight gain or loss.  You vomit blood or material that looks like coffee grounds.  You develop a severe headache.  You have shortness of breath.  You have any kind of trauma, such as from a fall or a car accident. Summary  The first trimester of pregnancy is from week 1 until the end of week 13 (months 1 through 3).  Your body goes through many changes during pregnancy. The changes vary from  woman to woman.  You will have routine prenatal visits. During those visits, your health care provider will examine you, discuss any test results you may have, and talk with you about how you are feeling. This information is not intended to replace advice given to you by your health care provider. Make sure you discuss any questions you have with your health care provider. Document Released: 02/14/2001 Document Revised: 02/02/2016 Document Reviewed: 02/02/2016 Elsevier Interactive Patient Education  2018 [employer]   [employer]  

## 2017-05-23 ENCOUNTER — Ambulatory Visit (INDEPENDENT_AMBULATORY_CARE_PROVIDER_SITE_OTHER): Payer: BLUE CROSS/BLUE SHIELD

## 2017-05-23 ENCOUNTER — Other Ambulatory Visit: Payer: Self-pay | Admitting: Adult Health

## 2017-05-23 ENCOUNTER — Other Ambulatory Visit: Payer: BLUE CROSS/BLUE SHIELD

## 2017-05-23 DIAGNOSIS — Z3A01 Less than 8 weeks gestation of pregnancy: Secondary | ICD-10-CM | POA: Diagnosis not present

## 2017-05-23 DIAGNOSIS — O3680X0 Pregnancy with inconclusive fetal viability, not applicable or unspecified: Secondary | ICD-10-CM

## 2017-05-23 DIAGNOSIS — Z3490 Encounter for supervision of normal pregnancy, unspecified, unspecified trimester: Secondary | ICD-10-CM

## 2017-05-23 NOTE — Progress Notes (Addendum)
US TA/TV: 5+1 wks GS 4.8 mm,? YS,no fetal pole visualized,normal ovaries bilat,simple right adnexa and cul de sac fluid,normal left ovary,pt will have labs today and f/u ultrasound in 10 days per Victorino DikeJennifer

## 2017-05-24 ENCOUNTER — Encounter: Payer: Self-pay | Admitting: Adult Health

## 2017-05-24 LAB — BETA HCG QUANT (REF LAB): hCG Quant: 3640 m[IU]/mL

## 2017-05-25 ENCOUNTER — Telehealth: Payer: Self-pay | Admitting: *Deleted

## 2017-05-25 NOTE — Telephone Encounter (Signed)
Pt called asking if she could take the penicillin that she was prescribed for a tooth infection. Informed pt that she could take it. Pt verbalized understanding.

## 2017-05-31 ENCOUNTER — Encounter: Payer: Self-pay | Admitting: Adult Health

## 2017-06-01 ENCOUNTER — Other Ambulatory Visit: Payer: Self-pay | Admitting: Obstetrics and Gynecology

## 2017-06-01 DIAGNOSIS — O3680X0 Pregnancy with inconclusive fetal viability, not applicable or unspecified: Secondary | ICD-10-CM

## 2017-06-04 ENCOUNTER — Encounter: Payer: Self-pay | Admitting: Obstetrics & Gynecology

## 2017-06-04 ENCOUNTER — Ambulatory Visit (INDEPENDENT_AMBULATORY_CARE_PROVIDER_SITE_OTHER): Payer: BLUE CROSS/BLUE SHIELD | Admitting: Obstetrics & Gynecology

## 2017-06-04 ENCOUNTER — Ambulatory Visit (INDEPENDENT_AMBULATORY_CARE_PROVIDER_SITE_OTHER): Payer: BLUE CROSS/BLUE SHIELD

## 2017-06-04 VITALS — BP 104/60 | HR 118 | Ht 63.0 in | Wt 214.0 lb

## 2017-06-04 DIAGNOSIS — O3680X0 Pregnancy with inconclusive fetal viability, not applicable or unspecified: Secondary | ICD-10-CM | POA: Diagnosis not present

## 2017-06-04 DIAGNOSIS — Z331 Pregnant state, incidental: Secondary | ICD-10-CM | POA: Diagnosis not present

## 2017-06-04 DIAGNOSIS — Z3A01 Less than 8 weeks gestation of pregnancy: Secondary | ICD-10-CM | POA: Diagnosis not present

## 2017-06-04 DIAGNOSIS — F410 Panic disorder [episodic paroxysmal anxiety] without agoraphobia: Secondary | ICD-10-CM

## 2017-06-04 MED ORDER — ESCITALOPRAM OXALATE 10 MG PO TABS: 10.0000 mg | ORAL_TABLET | Freq: Every day | ORAL | 1 refills | Status: DC

## 2017-06-04 NOTE — Progress Notes (Signed)
Chief Complaint  Patient presents with  . Anxiety      22 y.o. [id number] Patient's last menstrual period was 04/19/2017 (approximate). The current method of family planning is none.  Outpatient Encounter Medications as of 06/04/2017  Medication Sig  . prenatal vitamin w/FE, FA (PRENATAL 1 + 1) 27-1 MG TABS tablet Take 1 tablet by mouth daily at 12 noon. (Patient not taking: Reported on 07/20/2017)  . escitalopram (LEXAPRO) 10 MG tablet Take 1 tablet (10 mg total) by mouth daily.  . [DISCONTINUED] HYDROcodone-acetaminophen (NORCO/VICODIN) 5-325 MG tablet Take 1 tablet by mouth every 6 (six) hours as needed. (Patient not taking: Reported on 05/07/2017)  . [DISCONTINUED] ketorolac (TORADOL) 10 MG tablet Take 1 tablet (10 mg total) by mouth every 8 (eight) hours as needed. (Patient not taking: Reported on 05/07/2017)  . [DISCONTINUED] ondansetron (ZOFRAN ODT) 8 MG disintegrating tablet Take 1 tablet (8 mg total) by mouth every 8 (eight) hours as needed for nausea or vomiting. (Patient not taking: Reported on 05/07/2017)   No facility-administered encounter medications on file as of 06/04/2017.     Subjective Shawnie Pons  Presents today with increasing problems with day-to-day anxiety resulting in inability to cope effectively with her day-to-day responsibilities She states she is emotionally labile and others in her life tell her that She has difficulty managing things with the kids simple things that need to be done without to some degree decompensating She does not have any suicidal thoughts or ideations She is not afraid of crowds elevators bridges are wide open spaces She continues to go about daily activities out of the sense of obligation and need This is really been going on for many many years Past Medical History:  Diagnosis Date  . Anxiety   . Vaginal Pap smear, abnormal     Past Surgical History:  Procedure Laterality Date  . ABDOMINAL SURGERY    . CERVICAL ABLATION N/A  03/28/2017   Procedure: Laser Ablation of Cervix;  Surgeon: Lazaro Arms, MD;  Location: [facility];  Service: Gynecology;  Laterality: N/A;  . tubes in ears    . tumor 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      OB History    Gravida  3   Para  1   Term  1   Preterm      AB  1   Living  1     SAB  1   TAB      Ectopic      Multiple  0   Live Births  1           Allergies  Allergen Reactions  . Hibiclens [Chlorhexidine]     Social History   Socioeconomic History  . Marital status: Married    Spouse name: Not on file  . Number of children: Not on file  . Years of education: Not on file  . Highest education level: Not on file  Occupational History  . Not on file  Social Needs  . Financial resource strain: Not on file  . Food insecurity:    Worry: Not on file    Inability: Not on file  . Transportation needs:    Medical: Not on file    Non-medical: Not on file  Tobacco Use  . Smoking status: Former Smoker    Packs/day: 0.25    Types: Cigarettes  . Smokeless tobacco: Never Used  Substance and Sexual Activity  . Alcohol use: No    Comment: none  .  Drug use: No    Comment: denies  . Sexual activity: Yes    Birth control/protection: None  Lifestyle  . Physical activity:    Days per week: Not on file    Minutes per session: Not on file  . Stress: Not on file  Relationships  . Social connections:    Talks on phone: Not on file    Gets together: Not on file    Attends religious service: Not on file    Active member of club or organization: Not on file    Attends meetings of clubs or organizations: Not on file    Relationship status: Not on file  Other Topics Concern  . Not on file  Social History Narrative  . Not on file    Family History  Problem Relation Age of Onset  . Diabetes Maternal Grandmother   . Hypertension Maternal Grandmother   . COPD Maternal Grandmother   . Scoliosis Maternal Grandmother   . Heart disease Maternal Grandmother   . Hypertension  Maternal Grandfather   . Other Maternal Grandfather        2 stents in heart  . Heart attack Paternal Grandfather   . Congestive Heart Failure Neg Hx     Medications:       Current Outpatient Medications:  .  prenatal vitamin w/FE, FA (PRENATAL 1 + 1) 27-1 MG TABS tablet, Take 1 tablet by mouth daily at 12 noon. (Patient not taking: Reported on 07/20/2017), Disp: 30 each, Rfl: 12 .  Doxylamine-Pyridoxine (DICLEGIS) 10-10 MG TBEC, 2 tabs q hs, if sx persist add 1 tab q am on day 3, if sx persist add 1 tab q afternoon on day 4, Disp: 100 tablet, Rfl: 6 .  escitalopram (LEXAPRO) 10 MG tablet, Take 1 tablet (10 mg total) by mouth daily., Disp: 30 tablet, Rfl: 1  Objective Blood pressure 104/60, pulse (!) 118, height 5\' 3"  (1.6 m), weight 214 lb (97.1 kg), last menstrual period 04/19/2017, not currently breastfeeding.  Gen WDWN NAD  Pertinent ROS No suicidal thoughts or ideations  Labs or studies     Impression Diagnoses this Encounter::   ICD-10-CM   1. Panic disorder without agoraphobia F41.0     Established relevant diagnosis(es):   Plan/Recommendations: Meds ordered this encounter  Medications  . escitalopram (LEXAPRO) 10 MG tablet    Sig: Take 1 tablet (10 mg total) by mouth daily.    Dispense:  30 tablet    Refill:  1    Labs or Scans Ordered: No orders of the defined types were placed in this encounter.   Management:: Begin Lexapro 10 mg  Follow up Return for keep scheduled.     Face to face time:  15 minutes  Greater than 50% of the visit time was spent in counseling and coordination of care with the patient.  The summary and outline of the counseling and care coordination is summarized in the note above.   All questions were answered.     All questions were answered.

## 2017-06-04 NOTE — Progress Notes (Signed)
US 6+4 wks,single IUP w/ys,positive FHT 117 bpm,normal ovaries bilat,crl 5.7 mm,EDD 01/24/2018

## 2017-06-06 ENCOUNTER — Encounter: Payer: Self-pay | Admitting: Obstetrics & Gynecology

## 2017-06-07 ENCOUNTER — Other Ambulatory Visit: Payer: Self-pay | Admitting: Women's Health

## 2017-06-07 MED ORDER — DOXYLAMINE-PYRIDOXINE 10-10 MG PO TBEC: DELAYED_RELEASE_TABLET | ORAL | 6 refills | Status: DC

## 2017-06-19 ENCOUNTER — Encounter: Payer: Self-pay | Admitting: Women's Health

## 2017-06-19 ENCOUNTER — Ambulatory Visit: Payer: Medicaid Other | Admitting: *Deleted

## 2017-06-19 ENCOUNTER — Ambulatory Visit (INDEPENDENT_AMBULATORY_CARE_PROVIDER_SITE_OTHER): Payer: BLUE CROSS/BLUE SHIELD | Admitting: Women's Health

## 2017-06-19 VITALS — BP 124/88 | HR 105 | Wt 209.0 lb

## 2017-06-19 DIAGNOSIS — Z3A08 8 weeks gestation of pregnancy: Secondary | ICD-10-CM

## 2017-06-19 DIAGNOSIS — Z3481 Encounter for supervision of other normal pregnancy, first trimester: Secondary | ICD-10-CM

## 2017-06-19 DIAGNOSIS — O3441 Maternal care for other abnormalities of cervix, first trimester: Secondary | ICD-10-CM

## 2017-06-19 DIAGNOSIS — O3440 Maternal care for other abnormalities of cervix, unspecified trimester: Secondary | ICD-10-CM | POA: Insufficient documentation

## 2017-06-19 DIAGNOSIS — Z331 Pregnant state, incidental: Secondary | ICD-10-CM

## 2017-06-19 DIAGNOSIS — Z9889 Other specified postprocedural states: Secondary | ICD-10-CM

## 2017-06-19 DIAGNOSIS — Z3490 Encounter for supervision of normal pregnancy, unspecified, unspecified trimester: Secondary | ICD-10-CM | POA: Insufficient documentation

## 2017-06-19 DIAGNOSIS — Z1389 Encounter for screening for other disorder: Secondary | ICD-10-CM

## 2017-06-19 DIAGNOSIS — Z3A01 Less than 8 weeks gestation of pregnancy: Secondary | ICD-10-CM

## 2017-06-19 DIAGNOSIS — R87612 Low grade squamous intraepithelial lesion on cytologic smear of cervix (LGSIL): Secondary | ICD-10-CM

## 2017-06-19 LAB — POCT URINALYSIS DIPSTICK
Blood, UA: NEGATIVE
Glucose, UA: NEGATIVE
Leukocytes, UA: NEGATIVE
Nitrite, UA: NEGATIVE
Protein, UA: NEGATIVE

## 2017-06-19 NOTE — Progress Notes (Signed)
INITIAL OBSTETRICAL VISIT Patient name: Samantha Sandoval MRN [id number]  Date of birth: 12/29/1995 Chief Complaint:   Initial Prenatal Visit  History of Present Illness:   Samantha Sandoval is a 22 y.o. 363P1011 Caucasian female at 6980w5d by LMP c/w 6wk u/s, with an Estimated Date of Delivery: 01/24/18 being seen today for her initial obstetrical visit.   Her obstetrical history is significant for term uncomplicated SVB x1, SAB x 1.   Had LSIL/HPV pap 10/06/15, laser ablation of cervix 03/28/17, at 1wk post-op visit she was instructed no sex x 4wks, however by the time she returned 4wks later she had had sex, and shortly thereafter had a positive HPT. Dr. Despina HiddenEure has discussed possibility of cervical incompetence in depth w/ pt.  Today she reports no complaints.  Patient's last menstrual period was 04/19/2017 (approximate). Last pap 10/06/15. Results were: LSIL/HPV Review of Systems:   Pertinent items are noted in HPI Denies cramping/contractions, leakage of fluid, vaginal bleeding, abnormal vaginal discharge w/ itching/odor/irritation, headaches, visual changes, shortness of breath, chest pain, abdominal pain, severe nausea/vomiting, or problems with urination or bowel movements unless otherwise stated above.  Pertinent History Reviewed:  Reviewed past medical,surgical, social, obstetrical and family history.  Reviewed problem list, medications and allergies. OB History  Gravida Para Term Preterm AB Living  3 1 1   1 1   SAB TAB Ectopic Multiple Live Births  1     0 1    # Outcome Date GA Lbr Len/2nd Weight Sex Delivery Anes PTL Lv  3 Current           2 SAB 02/26/17          1 Term 05/16/16 8115w1d 18:10 / 00:28 7 lb 5.5 oz (3.33 kg) M Vag-Spont EPI N LIV   Physical Assessment:   Vitals:   06/19/17 1025  BP: 124/88  Pulse: (!) 105  Weight: 209 lb (94.8 kg)  Body mass index is 37.02 kg/m.       Physical Examination:  General appearance - well appearing, and in no distress  Mental status -  alert, oriented to person, place, and time  Psych:  She has a normal mood and affect  Skin - warm and dry, normal color, no suspicious lesions noted  Chest - effort normal, all lung fields clear to auscultation bilaterally  Heart - normal rate and regular rhythm  Abdomen - soft, nontender  Extremities:  No swelling or varicosities noted  Thin prep pap is not done   Fetal Heart Rate (bpm): +u/s via informal transabdominal u/s  Results for orders placed or performed in visit on 06/19/17 (from the past 24 hour(s))  POCT urinalysis dipstick   Collection Time: 06/19/17 10:43 AM  Result Value Ref Range   Color, UA     Clarity, UA     Glucose, UA neg    Bilirubin, UA     Ketones, UA moderate    Spec Grav, UA  1.010 - 1.025   Blood, UA neg    pH, UA  5.0 - 8.0   Protein, UA neg    Urobilinogen, UA  0.2 or 1.0 E.U./dL   Nitrite, UA neg    Leukocytes, UA Negative Negative   Appearance     Odor      Assessment & Plan:  1) Low-Risk Pregnancy G3P1011 at 380w5d with an Estimated Date of Delivery: 01/24/18   2) Initial OB visit  3) S/P laser ablation cervix 03/28/17> increased r/f cervical incompetence, discussed w/ LHE,  will do q2wk cervical length u/s beginning at 16wks  Meds: No orders of the defined types were placed in this encounter.   Initial labs obtained Continue prenatal vitamins Reviewed n/v relief measures and warning s/s to report Reviewed recommended weight gain based on pre-gravid BMI Encouraged well-balanced diet Genetic Screening discussed Integrated Screen: requested Cystic fibrosis screening discussed neg prev preg Ultrasound discussed; fetal survey: requested CCNC completed>spoke w/ PCM  Follow-up: Return in about 1 month (around 07/17/2017) for LROB.   Orders Placed This Encounter  Procedures  . Urine Culture  . Obstetric Panel, Including HIV  . Urinalysis, Routine w reflex microscopic  . Pain Management Screening Profile (10S)  . POCT urinalysis dipstick      Cheral Marker CNM, [facility] 06/19/2017 1:38 PM

## 2017-06-19 NOTE — Patient Instructions (Signed)
Samantha Sandoval, I greatly value your feedback.  If you receive a survey following your visit with us today, we appreciate you taking the time to fill it out.  Thanks, Joellyn HaffKim Antony Sian, CNM, WHNP-BC   Nausea & Vomiting  Have saltine crackers or pretzels by your bed and eat a few bites before you raise your head out of bed in the morning  Eat small frequent meals throughout the day instead of large meals  Drink plenty of fluids throughout the day to stay hydrated, just don't drink a lot of fluids with your meals.  This can make your stomach fill up faster making you feel sick  Do not brush your teeth right after you eat  Products with real ginger are good for nausea, like ginger ale and ginger hard candy Make sure it says made with real ginger!  Sucking on sour candy like lemon heads is also good for nausea  If your prenatal vitamins make you nauseated, take them at night so you will sleep through the nausea  Sea Bands  If you feel like you need medicine for the nausea & vomiting please let us know  If you are unable to keep any fluids or food down please let us know   Constipation  Drink plenty of fluid, preferably water, throughout the day  Eat foods high in fiber such as fruits, vegetables, and grains  Exercise, such as walking, is a good way to keep your bowels regular  Drink warm fluids, especially warm prune juice, or decaf coffee  Eat a 1/2 cup of real oatmeal (not instant), 1/2 cup applesauce, and 1/2-1 cup warm prune juice every day  If needed, you may take Colace (docusate sodium) stool softener once or twice a day to help keep the stool soft. If you are pregnant, wait until you are out of your first trimester (12-14 weeks of pregnancy)  If you still are having problems with constipation, you may take Miralax once daily as needed to help keep your bowels regular.  If you are pregnant, wait until you are out of your first trimester (12-14 weeks of pregnancy)   First  Trimester of Pregnancy The first trimester of pregnancy is from week 1 until the end of week 12 (months 1 through 3). A week after a sperm fertilizes an egg, the egg will implant on the wall of the uterus. This embryo will begin to develop into a baby. Genes from you and your partner are forming the baby. The female genes determine whether the baby is a boy or a girl. At 6-8 weeks, the eyes and face are formed, and the heartbeat can be seen on ultrasound. At the end of 12 weeks, all the baby's organs are formed.  Now that you are pregnant, you will want to do everything you can to have a healthy baby. Two of the most important things are to get good prenatal care and to follow your health care provider's instructions. Prenatal care is all the medical care you receive before the baby's birth. This care will help prevent, find, and treat any problems during the pregnancy and childbirth. BODY CHANGES Your body goes through many changes during pregnancy. The changes vary from woman to woman.   You may gain or lose a couple of pounds at first.  You may feel sick to your stomach (nauseous) and throw up (vomit). If the vomiting is uncontrollable, call your health care provider.  You may tire easily.  You may develop headaches that  can be relieved by medicines approved by your health care provider.  You may urinate more often. Painful urination may mean you have a bladder infection.  You may develop heartburn as a result of your pregnancy.  You may develop constipation because certain hormones are causing the muscles that push waste through your intestines to slow down.  You may develop hemorrhoids or swollen, bulging veins (varicose veins).  Your breasts may begin to grow larger and become tender. Your nipples may stick out more, and the tissue that surrounds them (areola) may become darker.  Your gums may bleed and may be sensitive to brushing and flossing.  Dark spots or blotches (chloasma, mask  of pregnancy) may develop on your face. This will likely fade after the baby is born.  Your menstrual periods will stop.  You may have a loss of appetite.  You may develop cravings for certain kinds of food.  You may have changes in your emotions from day to day, such as being excited to be pregnant or being concerned that something may go wrong with the pregnancy and baby.  You may have more vivid and strange dreams.  You may have changes in your hair. These can include thickening of your hair, rapid growth, and changes in texture. Some women also have hair loss during or after pregnancy, or hair that feels dry or thin. Your hair will most likely return to normal after your baby is born. WHAT TO EXPECT AT YOUR PRENATAL VISITS During a routine prenatal visit:  You will be weighed to make sure you and the baby are growing normally.  Your blood pressure will be taken.  Your abdomen will be measured to track your baby's growth.  The fetal heartbeat will be listened to starting around week 10 or 12 of your pregnancy.  Test results from any previous visits will be discussed. Your health care provider may ask you:  How you are feeling.  If you are feeling the baby move.  If you have had any abnormal symptoms, such as leaking fluid, bleeding, severe headaches, or abdominal cramping.  If you have any questions. Other tests that may be performed during your first trimester include:  Blood tests to find your blood type and to check for the presence of any previous infections. They will also be used to check for low iron levels (anemia) and Rh antibodies. Later in the pregnancy, blood tests for diabetes will be done along with other tests if problems develop.  Urine tests to check for infections, diabetes, or protein in the urine.  An ultrasound to confirm the proper growth and development of the baby.  An amniocentesis to check for possible genetic problems.  Fetal screens for spina  bifida and Down syndrome.  You may need other tests to make sure you and the baby are doing well. HOME CARE INSTRUCTIONS  Medicines  Follow your health care provider's instructions regarding medicine use. Specific medicines may be either safe or unsafe to take during pregnancy.  Take your prenatal vitamins as directed.  If you develop constipation, try taking a stool softener if your health care provider approves. Diet  Eat regular, well-balanced meals. Choose a variety of foods, such as meat or vegetable-based protein, fish, milk and low-fat dairy products, vegetables, fruits, and whole grain breads and cereals. Your health care provider will help you determine the amount of weight gain that is right for you.  Avoid raw meat and uncooked cheese. These carry germs that can cause  birth defects in the baby.  Eating four or five small meals rather than three large meals a day may help relieve nausea and vomiting. If you start to feel nauseous, eating a few soda crackers can be helpful. Drinking liquids between meals instead of during meals also seems to help nausea and vomiting.  If you develop constipation, eat more high-fiber foods, such as fresh vegetables or fruit and whole grains. Drink enough fluids to keep your urine clear or pale yellow. Activity and Exercise  Exercise only as directed by your health care provider. Exercising will help you:  Control your weight.  Stay in shape.  Be prepared for labor and delivery.  Experiencing pain or cramping in the lower abdomen or low back is a good sign that you should stop exercising. Check with your health care provider before continuing normal exercises.  Try to avoid standing for long periods of time. Move your legs often if you must stand in one place for a long time.  Avoid heavy lifting.  Wear low-heeled shoes, and practice good posture.  You may continue to have sex unless your health care provider directs you  otherwise. Relief of Pain or Discomfort  Wear a good support bra for breast tenderness.   Take warm sitz baths to soothe any pain or discomfort caused by hemorrhoids. Use hemorrhoid cream if your health care provider approves.   Rest with your legs elevated if you have leg cramps or low back pain.  If you develop varicose veins in your legs, wear support hose. Elevate your feet for 15 minutes, 3-4 times a day. Limit salt in your diet. Prenatal Care  Schedule your prenatal visits by the twelfth week of pregnancy. They are usually scheduled monthly at first, then more often in the last 2 months before delivery.  Write down your questions. Take them to your prenatal visits.  Keep all your prenatal visits as directed by your health care provider. Safety  Wear your seat belt at all times when driving.  Make a list of emergency phone numbers, including numbers for family, friends, the hospital, and police and fire departments. General Tips  Ask your health care provider for a referral to a local prenatal education class. Begin classes no later than at the beginning of month 6 of your pregnancy.  Ask for help if you have counseling or nutritional needs during pregnancy. Your health care provider can offer advice or refer you to specialists for help with various needs.  Do not use hot tubs, steam rooms, or saunas.  Do not douche or use tampons or scented sanitary pads.  Do not cross your legs for long periods of time.  Avoid cat litter boxes and soil used by cats. These carry germs that can cause birth defects in the baby and possibly loss of the fetus by miscarriage or stillbirth.  Avoid all smoking, herbs, alcohol, and medicines not prescribed by your health care provider. Chemicals in these affect the formation and growth of the baby.  Schedule a dentist appointment. At home, brush your teeth with a soft toothbrush and be gentle when you floss. SEEK MEDICAL CARE IF:   You have  dizziness.  You have mild pelvic cramps, pelvic pressure, or nagging pain in the abdominal area.  You have persistent nausea, vomiting, or diarrhea.  You have a bad smelling vaginal discharge.  You have pain with urination.  You notice increased swelling in your face, hands, legs, or ankles. SEEK IMMEDIATE MEDICAL CARE IF:  You have a fever.  You are leaking fluid from your vagina.  You have spotting or bleeding from your vagina.  You have severe abdominal cramping or pain.  You have rapid weight gain or loss.  You vomit blood or material that looks like coffee grounds.  You are exposed to [place] measles and have never had them.  You are exposed to fifth disease or chickenpox.  You develop a severe headache.  You have shortness of breath.  You have any kind of trauma, such as from a fall or a car accident. Document Released: 02/14/2001 Document Revised: 07/07/2013 Document Reviewed: 12/31/2012 [facility] Patient Information 2015 [place], [place]. This information is not intended to replace advice given to you by your health care provider. Make sure you discuss any questions you have with your health care provider.

## 2017-06-20 LAB — OBSTETRIC PANEL, INCLUDING HIV
Antibody Screen: NEGATIVE
Basophils Absolute: 0 10*3/uL (ref 0.0–0.2)
Basos: 0 %
EOS (ABSOLUTE): 0 10*3/uL (ref 0.0–0.4)
Eos: 0 %
HIV Screen 4th Generation wRfx: NONREACTIVE
Hematocrit: 43.4 % (ref 34.0–46.6)
Hemoglobin: 13.9 g/dL (ref 11.1–15.9)
Hepatitis B Surface Ag: NEGATIVE
Immature Grans (Abs): 0 10*3/uL (ref 0.0–0.1)
Immature Granulocytes: 0 %
Lymphocytes Absolute: 1.2 10*3/uL (ref 0.7–3.1)
Lymphs: 11 %
MCH: 28.3 pg (ref 26.6–33.0)
MCHC: 32 g/dL (ref 31.5–35.7)
MCV: 88 fL (ref 79–97)
Monocytes Absolute: 0.6 10*3/uL (ref 0.1–0.9)
Monocytes: 5 %
Neutrophils Absolute: 8.8 10*3/uL — ABNORMAL HIGH (ref 1.4–7.0)
Neutrophils: 84 %
Platelets: 290 10*3/uL (ref 150–379)
RBC: 4.92 x10E6/uL (ref 3.77–5.28)
RDW: 14 % (ref 12.3–15.4)
RPR Ser Ql: NONREACTIVE
Rh Factor: POSITIVE
Rubella Antibodies, IGG: 3.23 index (ref >0.99)
WBC: 10.7 10*3/uL (ref 3.4–10.8)

## 2017-06-20 LAB — URINALYSIS, ROUTINE W REFLEX MICROSCOPIC
Bilirubin, UA: NEGATIVE
Glucose, UA: NEGATIVE
Nitrite, UA: NEGATIVE
Protein, UA: NEGATIVE
RBC, UA: NEGATIVE
Specific Gravity, UA: >=1.03 — AB (ref 1.005–1.030)
Urobilinogen, Ur: 1 mg/dL (ref 0.2–1.0)
pH, UA: 5 (ref 5.0–7.5)

## 2017-06-20 LAB — MICROSCOPIC EXAMINATION
Casts: NONE SEEN /lpf
Epithelial Cells (non renal): >10 /hpf — AB (ref 0–10)

## 2017-06-21 LAB — URINE CULTURE

## 2017-06-25 ENCOUNTER — Encounter: Payer: Self-pay | Admitting: Women's Health

## 2017-06-25 ENCOUNTER — Other Ambulatory Visit: Payer: Self-pay | Admitting: Women's Health

## 2017-06-25 DIAGNOSIS — O2340 Unspecified infection of urinary tract in pregnancy, unspecified trimester: Secondary | ICD-10-CM | POA: Insufficient documentation

## 2017-06-25 DIAGNOSIS — O99891 Other specified diseases and conditions complicating pregnancy: Secondary | ICD-10-CM

## 2017-06-25 DIAGNOSIS — O9989 Other specified diseases and conditions complicating pregnancy, childbirth and the puerperium: Principal | ICD-10-CM

## 2017-06-25 DIAGNOSIS — R8271 Bacteriuria: Secondary | ICD-10-CM

## 2017-06-25 MED ORDER — PENICILLIN V POTASSIUM 500 MG PO TABS: 500.0000 mg | ORAL_TABLET | Freq: Four times a day (QID) | ORAL | 0 refills | Status: DC

## 2017-07-03 ENCOUNTER — Encounter: Payer: Medicaid Other | Admitting: Obstetrics & Gynecology

## 2017-07-17 ENCOUNTER — Encounter: Payer: Medicaid Other | Admitting: Women's Health

## 2017-07-20 ENCOUNTER — Other Ambulatory Visit: Payer: Self-pay | Admitting: Obstetrics and Gynecology

## 2017-07-20 ENCOUNTER — Other Ambulatory Visit: Payer: Self-pay

## 2017-07-20 ENCOUNTER — Ambulatory Visit (INDEPENDENT_AMBULATORY_CARE_PROVIDER_SITE_OTHER): Payer: BLUE CROSS/BLUE SHIELD | Admitting: Obstetrics and Gynecology

## 2017-07-20 ENCOUNTER — Ambulatory Visit (INDEPENDENT_AMBULATORY_CARE_PROVIDER_SITE_OTHER): Payer: BLUE CROSS/BLUE SHIELD

## 2017-07-20 ENCOUNTER — Other Ambulatory Visit: Payer: BLUE CROSS/BLUE SHIELD

## 2017-07-20 VITALS — BP 118/76 | HR 110 | Wt 205.0 lb

## 2017-07-20 DIAGNOSIS — Z3A13 13 weeks gestation of pregnancy: Secondary | ICD-10-CM

## 2017-07-20 DIAGNOSIS — Z3481 Encounter for supervision of other normal pregnancy, first trimester: Secondary | ICD-10-CM

## 2017-07-20 DIAGNOSIS — Z3682 Encounter for antenatal screening for nuchal translucency: Secondary | ICD-10-CM | POA: Diagnosis not present

## 2017-07-20 DIAGNOSIS — O3442 Maternal care for other abnormalities of cervix, second trimester: Secondary | ICD-10-CM

## 2017-07-20 DIAGNOSIS — Z9889 Other specified postprocedural states: Secondary | ICD-10-CM

## 2017-07-20 DIAGNOSIS — Z1389 Encounter for screening for other disorder: Secondary | ICD-10-CM

## 2017-07-20 DIAGNOSIS — Z331 Pregnant state, incidental: Secondary | ICD-10-CM

## 2017-07-20 DIAGNOSIS — Z3402 Encounter for supervision of normal first pregnancy, second trimester: Secondary | ICD-10-CM

## 2017-07-20 LAB — POCT URINALYSIS DIPSTICK
Blood, UA: NEGATIVE
Glucose, UA: NEGATIVE
Ketones, UA: NEGATIVE
Leukocytes, UA: NEGATIVE
Nitrite, UA: NEGATIVE
Protein, UA: NEGATIVE

## 2017-07-20 NOTE — Progress Notes (Signed)
[place] 13+1 wks,measurements c/w dates,crl 76.54 mm,fhr 164 bpm,anterior pl gr 0,normal ovaries bilat,NB present,NT 1.8 mm

## 2017-07-20 NOTE — Progress Notes (Signed)
[id number]  Estimated Date of Delivery: 01/24/18 LROB [redacted]w[redacted]d  Chief Complaint  Patient presents with  . Routine Prenatal Visit  ____  Patient complaints: routine prenatal visit 13+1 weeks.  Patient desires to get integrated testing is not scheduled for an ultrasound today but will do this today. Patient reports                             denies any bleeding , rupture of membranes,or regular contractions.  Blood pressure 118/76, pulse (!) 110, weight 205 lb (93 kg), last menstrual period 04/19/2017, not currently breastfeeding.   Urine results:notable for negative protein refer to the ob flow sheet for FH and FHR, ,                          Physical Examination: General appearance - alert, well appearing, and in no distress and overweight                                      Abdomen - -FHR 156                                                                                               Pelvic -deferred                                            Questions were answered. 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

## 2017-07-21 LAB — MED LIST OPTION NOT SELECTED

## 2017-07-22 LAB — GC/CHLAMYDIA PROBE AMP
Chlamydia trachomatis, NAA: NEGATIVE
Neisseria gonorrhoeae by PCR: NEGATIVE

## 2017-07-22 LAB — URINE CULTURE

## 2017-07-23 LAB — PMP SCREEN PROFILE (10S), URINE
Amphetamine Scrn, Ur: NEGATIVE ng/mL
BARBITURATE SCREEN URINE: NEGATIVE ng/mL
BENZODIAZEPINE SCREEN, URINE: NEGATIVE ng/mL
CANNABINOIDS UR QL SCN: NEGATIVE ng/mL
Cocaine (Metab) Scrn, Ur: NEGATIVE ng/mL
Creatinine(Crt), U: 229 mg/dL (ref 20.0–300.0)
Methadone Screen, Urine: NEGATIVE ng/mL
OXYCODONE+OXYMORPHONE UR QL SCN: NEGATIVE ng/mL
Opiate Scrn, Ur: NEGATIVE ng/mL
Ph of Urine: 5.8 (ref 4.5–8.9)
Phencyclidine Qn, Ur: NEGATIVE ng/mL
Propoxyphene Scrn, Ur: NEGATIVE ng/mL

## 2017-07-24 ENCOUNTER — Telehealth: Payer: Self-pay | Admitting: Obstetrics & Gynecology

## 2017-07-24 LAB — INTEGRATED 1
Crown Rump Length: 76.5 mm
Gest. Age on Collection Date: 13.4 weeks
Maternal Age at EDD: 24.2 yr
Nuchal Translucency (NT): 1.8 mm
Number of Fetuses: 1
PAPP-A Value: 814 ng/mL
Weight: 205 [lb_av]

## 2017-07-24 NOTE — Telephone Encounter (Signed)
Patient to bring by list of vaccines she is supposed to receive and be wavered.

## 2017-07-25 ENCOUNTER — Encounter: Payer: Self-pay | Admitting: *Deleted

## 2017-07-30 ENCOUNTER — Encounter: Payer: Self-pay | Admitting: Obstetrics and Gynecology

## 2017-08-16 ENCOUNTER — Encounter: Payer: Self-pay | Admitting: Women's Health

## 2017-08-16 ENCOUNTER — Ambulatory Visit (INDEPENDENT_AMBULATORY_CARE_PROVIDER_SITE_OTHER): Payer: BLUE CROSS/BLUE SHIELD | Admitting: Women's Health

## 2017-08-16 VITALS — BP 123/70 | HR 114 | Wt 205.6 lb

## 2017-08-16 DIAGNOSIS — Z3482 Encounter for supervision of other normal pregnancy, second trimester: Secondary | ICD-10-CM

## 2017-08-16 DIAGNOSIS — Z363 Encounter for antenatal screening for malformations: Secondary | ICD-10-CM

## 2017-08-16 DIAGNOSIS — Z1379 Encounter for other screening for genetic and chromosomal anomalies: Secondary | ICD-10-CM

## 2017-08-16 DIAGNOSIS — O99891 Other specified diseases and conditions complicating pregnancy: Secondary | ICD-10-CM

## 2017-08-16 DIAGNOSIS — O9989 Other specified diseases and conditions complicating pregnancy, childbirth and the puerperium: Secondary | ICD-10-CM

## 2017-08-16 DIAGNOSIS — Z3A17 17 weeks gestation of pregnancy: Secondary | ICD-10-CM

## 2017-08-16 DIAGNOSIS — R8271 Bacteriuria: Secondary | ICD-10-CM

## 2017-08-16 DIAGNOSIS — Z1389 Encounter for screening for other disorder: Secondary | ICD-10-CM

## 2017-08-16 DIAGNOSIS — Z331 Pregnant state, incidental: Secondary | ICD-10-CM

## 2017-08-16 LAB — POCT URINALYSIS DIPSTICK
Blood, UA: NEGATIVE
Glucose, UA: NEGATIVE
Ketones, UA: NEGATIVE
Leukocytes, UA: NEGATIVE
Nitrite, UA: NEGATIVE
Protein, UA: NEGATIVE

## 2017-08-16 NOTE — Progress Notes (Signed)
   LOW-RISK PREGNANCY VISIT Patient name: Samantha Sandoval MRN [id number]  Date of birth: 02/19/1996 Chief Complaint:   Routine Prenatal Visit  History of Present Illness:   Samantha Sandoval is a 22 y.o. 13P1011 female at 864w0d with an Estimated Date of Delivery: 01/24/18 being seen today for ongoing management of a low-risk pregnancy.  Today she reports no complaints. Contractions: Not present. Vag. Bleeding: None.  Movement: Absent. denies leaking of fluid. Review of Systems:   Pertinent items are noted in HPI Denies abnormal vaginal discharge w/ itching/odor/irritation, headaches, visual changes, shortness of breath, chest pain, abdominal pain, severe nausea/vomiting, or problems with urination or bowel movements unless otherwise stated above. Pertinent History Reviewed:  Reviewed past medical,surgical, social, obstetrical and family history.  Reviewed problem list, medications and allergies. Physical Assessment:   Vitals:   08/16/17 1034  BP: 123/70  Pulse: (!) 114  Weight: 205 lb 9.6 oz (93.3 kg)  Body mass index is 36.42 kg/m.        Physical Examination:   General appearance: Well appearing, and in no distress  Mental status: Alert, oriented to person, place, and time  Skin: Warm & dry  Cardiovascular: Normal heart rate noted  Respiratory: Normal respiratory effort, no distress  Abdomen: Soft, gravid, nontender  Pelvic: Cervical exam deferred         Extremities: Edema: None  Fetal Status: Fetal Heart Rate (bpm): 148   Movement: Absent    Results for orders placed or performed in visit on 08/16/17 (from the past 24 hour(s))  POCT urinalysis dipstick   Collection Time: 08/16/17 10:37 AM  Result Value Ref Range   Color, UA     Clarity, UA     Glucose, UA Negative Negative   Bilirubin, UA     Ketones, UA neg    Spec Grav, UA  1.010 - 1.025   Blood, UA neg    pH, UA  5.0 - 8.0   Protein, UA Negative Negative   Urobilinogen, UA  0.2 or 1.0 E.U./dL   Nitrite, UA neg    Leukocytes, UA Negative Negative   Appearance     Odor      Assessment & Plan:  1) Low-risk pregnancy G3P1011 at 3264w0d with an Estimated Date of Delivery: 01/24/18   2) S/P laser ablation cervix, CL w/ anatomy u/s   Meds: No orders of the defined types were placed in this encounter.  Labs/procedures today: 2nd IT  Plan:  Continue routine obstetrical care   Reviewed: Preterm labor symptoms and general obstetric precautions including but not limited to vaginal bleeding, contractions, leaking of fluid and fetal movement were reviewed in detail with the patient.  All questions were answered  Follow-up: Return for 2wks for anatomy u/s and LROB.  Orders Placed This Encounter  Procedures  . US OB Comp + 14 Wk  . INTEGRATED 2  . POCT urinalysis dipstick   Cheral MarkerKimberly R Archimedes Harold CNM, [facility], LLCWHNP-BC 08/16/2017 11:08 AM

## 2017-08-16 NOTE — Patient Instructions (Signed)
Samantha Sandoval, I Shawnie Ponsgreatly value your feedback.  If you receive a survey following your visit with us today, we appreciate you taking the time to fill it out.  Thanks, Joellyn HaffKim Xayvier Vallez, CNM, WHNP-BC   Second Trimester of Pregnancy The second trimester is from week 14 through week 27 (months 4 through 6). The second trimester is often a time when you feel your best. Your body has adjusted to being pregnant, and you begin to feel better physically. Usually, morning sickness has lessened or quit completely, you may have more energy, and you may have an increase in appetite. The second trimester is also a time when the fetus is growing rapidly. At the end of the sixth month, the fetus is about 9 inches long and weighs about 1 pounds. You will likely begin to feel the baby move (quickening) between 16 and 20 weeks of pregnancy. Body changes during your second trimester Your body continues to go through many changes during your second trimester. The changes vary from woman to woman.  Your weight will continue to increase. You will notice your lower abdomen bulging out.  You may begin to get stretch marks on your hips, abdomen, and breasts.  You may develop headaches that can be relieved by medicines. The medicines should be approved by your health care provider.  You may urinate more often because the fetus is pressing on your bladder.  You may develop or continue to have heartburn as a result of your pregnancy.  You may develop constipation because certain hormones are causing the muscles that push waste through your intestines to slow down.  You may develop hemorrhoids or swollen, bulging veins (varicose veins).  You may have back pain. This is caused by: ? Weight gain. ? Pregnancy hormones that are relaxing the joints in your pelvis. ? A shift in weight and the muscles that support your balance.  Your breasts will continue to grow and they will continue to become tender.  Your gums may bleed and  may be sensitive to brushing and flossing.  Dark spots or blotches (chloasma, mask of pregnancy) may develop on your face. This will likely fade after the baby is born.  A dark line from your belly button to the pubic area (linea nigra) may appear. This will likely fade after the baby is born.  You may have changes in your hair. These can include thickening of your hair, rapid growth, and changes in texture. Some women also have hair loss during or after pregnancy, or hair that feels dry or thin. Your hair will most likely return to normal after your baby is born.  What to expect at prenatal visits During a routine prenatal visit:  You will be weighed to make sure you and the fetus are growing normally.  Your blood pressure will be taken.  Your abdomen will be measured to track your baby's growth.  The fetal heartbeat will be listened to.  Any test results from the previous visit will be discussed.  Your health care provider may ask you:  How you are feeling.  If you are feeling the baby move.  If you have had any abnormal symptoms, such as leaking fluid, bleeding, severe headaches, or abdominal cramping.  If you are using any tobacco products, including cigarettes, chewing tobacco, and electronic cigarettes.  If you have any questions.  Other tests that may be performed during your second trimester include:  Blood tests that check for: ? Low iron levels (anemia). ? High blood  sugar that affects pregnant women (gestational diabetes) between 55 and 28 weeks. ? Rh antibodies. This is to check for a protein on red blood cells (Rh factor).  Urine tests to check for infections, diabetes, or protein in the urine.  An ultrasound to confirm the proper growth and development of the baby.  An amniocentesis to check for possible genetic problems.  Fetal screens for spina bifida and Down syndrome.  HIV (human immunodeficiency virus) testing. Routine prenatal testing includes  screening for HIV, unless you choose not to have this test.  Follow these instructions at home: Medicines  Follow your health care provider's instructions regarding medicine use. Specific medicines may be either safe or unsafe to take during pregnancy.  Take a prenatal vitamin that contains at least 600 micrograms (mcg) of folic acid.  If you develop constipation, try taking a stool softener if your health care provider approves. Eating and drinking  Eat a balanced diet that includes fresh fruits and vegetables, whole grains, good sources of protein such as meat, eggs, or tofu, and low-fat dairy. Your health care provider will help you determine the amount of weight gain that is right for you.  Avoid raw meat and uncooked cheese. These carry germs that can cause birth defects in the baby.  If you have low calcium intake from food, talk to your health care provider about whether you should take a daily calcium supplement.  Limit foods that are high in fat and processed sugars, such as fried and sweet foods.  To prevent constipation: ? Drink enough fluid to keep your urine clear or pale yellow. ? Eat foods that are high in fiber, such as fresh fruits and vegetables, whole grains, and beans. Activity  Exercise only as directed by your health care provider. Most women can continue their usual exercise routine during pregnancy. Try to exercise for 30 minutes at least 5 days a week. Stop exercising if you experience uterine contractions.  Avoid heavy lifting, wear low heel shoes, and practice good posture.  A sexual relationship may be continued unless your health care provider directs you otherwise. Relieving pain and discomfort  Wear a good support bra to prevent discomfort from breast tenderness.  Take warm sitz baths to soothe any pain or discomfort caused by hemorrhoids. Use hemorrhoid cream if your health care provider approves.  Rest with your legs elevated if you have leg cramps  or low back pain.  If you develop varicose veins, wear support hose. Elevate your feet for 15 minutes, 3-4 times a day. Limit salt in your diet. Prenatal Care  Write down your questions. Take them to your prenatal visits.  Keep all your prenatal visits as told by your health care provider. This is important. Safety  Wear your seat belt at all times when driving.  Make a list of emergency phone numbers, including numbers for family, friends, the hospital, and police and fire departments. General instructions  Ask your health care provider for a referral to a local prenatal education class. Begin classes no later than the beginning of month 6 of your pregnancy.  Ask for help if you have counseling or nutritional needs during pregnancy. Your health care provider can offer advice or refer you to specialists for help with various needs.  Do not use hot tubs, steam rooms, or saunas.  Do not douche or use tampons or scented sanitary pads.  Do not cross your legs for long periods of time.  Avoid cat litter boxes and soil used  by cats. These carry germs that can cause birth defects in the baby and possibly loss of the fetus by miscarriage or stillbirth.  Avoid all smoking, herbs, alcohol, and unprescribed drugs. Chemicals in these products can affect the formation and growth of the baby.  Do not use any products that contain nicotine or tobacco, such as cigarettes and e-cigarettes. If you need help quitting, ask your health care provider.  Visit your dentist if you have not gone yet during your pregnancy. Use a soft toothbrush to brush your teeth and be gentle when you floss. Contact a health care provider if:  You have dizziness.  You have mild pelvic cramps, pelvic pressure, or nagging pain in the abdominal area.  You have persistent nausea, vomiting, or diarrhea.  You have a bad smelling vaginal discharge.  You have pain when you urinate. Get help right away if:  You have a  fever.  You are leaking fluid from your vagina.  You have spotting or bleeding from your vagina.  You have severe abdominal cramping or pain.  You have rapid weight gain or weight loss.  You have shortness of breath with chest pain.  You notice sudden or extreme swelling of your face, hands, ankles, feet, or legs.  You have not felt your baby move in over an hour.  You have severe headaches that do not go away when you take medicine.  You have vision changes. Summary  The second trimester is from week 14 through week 27 (months 4 through 6). It is also a time when the fetus is growing rapidly.  Your body goes through many changes during pregnancy. The changes vary from woman to woman.  Avoid all smoking, herbs, alcohol, and unprescribed drugs. These chemicals affect the formation and growth your baby.  Do not use any tobacco products, such as cigarettes, chewing tobacco, and e-cigarettes. If you need help quitting, ask your health care provider.  Contact your health care provider if you have any questions. Keep all prenatal visits as told by your health care provider. This is important. This information is not intended to replace advice given to you by your health care provider. Make sure you discuss any questions you have with your health care provider. Document Released: 02/14/2001 Document Revised: 07/29/2015 Document Reviewed: 04/23/2012 Elsevier Interactive Patient Education  2017 [employer].

## 2017-08-17 ENCOUNTER — Encounter: Payer: Medicaid Other | Admitting: Women's Health

## 2017-08-20 LAB — INTEGRATED 2
AFP MoM: 1.06
Alpha-Fetoprotein: 30.6 ng/mL
Crown Rump Length: 76.5 mm
DIA MoM: 0.73
DIA Value: 103.9 pg/mL
Estriol, Unconjugated: 0.92 ng/mL
Gest. Age on Collection Date: 13.4 wk
Gestational Age: 17.3 wk
Maternal Age at EDD: 24.2 a
Nuchal Translucency (NT): 1.8 mm
Nuchal Translucency MoM: 1.01
Number of Fetuses: 1
PAPP-A MoM: 0.95
PAPP-A Value: 814 ng/mL
Test Results:: NEGATIVE
Weight: 203 [lb_av]
Weight: 205 [lb_av]
hCG MoM: 0.7
hCG Value: 16.4 [IU]/mL
uE3 MoM: 0.93

## 2017-08-30 ENCOUNTER — Ambulatory Visit (INDEPENDENT_AMBULATORY_CARE_PROVIDER_SITE_OTHER): Payer: BLUE CROSS/BLUE SHIELD

## 2017-08-30 DIAGNOSIS — Z363 Encounter for antenatal screening for malformations: Secondary | ICD-10-CM | POA: Diagnosis not present

## 2017-08-30 DIAGNOSIS — Z3482 Encounter for supervision of other normal pregnancy, second trimester: Secondary | ICD-10-CM

## 2017-08-30 NOTE — Progress Notes (Signed)
US 19 wks,breech,cx 4.4 cm,anterior pl gr 0,svp of fluid 4.3 cm,normal ovaries bilat,fhr 171 bpm,efw 284 g 62%,please have pt come back for additional images of heart,no obvious abnormalities

## 2017-08-31 ENCOUNTER — Encounter: Payer: BLUE CROSS/BLUE SHIELD | Admitting: Obstetrics and Gynecology

## 2017-09-10 ENCOUNTER — Encounter: Payer: Self-pay | Admitting: Women's Health

## 2017-09-12 ENCOUNTER — Encounter: Payer: Self-pay | Admitting: Women's Health

## 2017-09-12 ENCOUNTER — Ambulatory Visit (INDEPENDENT_AMBULATORY_CARE_PROVIDER_SITE_OTHER): Payer: BLUE CROSS/BLUE SHIELD | Admitting: Women's Health

## 2017-09-12 VITALS — BP 115/66 | HR 113 | Wt 204.3 lb

## 2017-09-12 DIAGNOSIS — IMO0001 Reserved for inherently not codable concepts without codable children: Secondary | ICD-10-CM

## 2017-09-12 DIAGNOSIS — Z1389 Encounter for screening for other disorder: Secondary | ICD-10-CM

## 2017-09-12 DIAGNOSIS — Z9889 Other specified postprocedural states: Secondary | ICD-10-CM

## 2017-09-12 DIAGNOSIS — Z331 Pregnant state, incidental: Secondary | ICD-10-CM

## 2017-09-12 DIAGNOSIS — W273XXA Contact with needle (sewing), initial encounter: Secondary | ICD-10-CM

## 2017-09-12 DIAGNOSIS — O3441 Maternal care for other abnormalities of cervix, first trimester: Secondary | ICD-10-CM

## 2017-09-12 DIAGNOSIS — Z362 Encounter for other antenatal screening follow-up: Secondary | ICD-10-CM

## 2017-09-12 DIAGNOSIS — Z3482 Encounter for supervision of other normal pregnancy, second trimester: Secondary | ICD-10-CM

## 2017-09-12 DIAGNOSIS — S61239A Puncture wound without foreign body of unspecified finger without damage to nail, initial encounter: Secondary | ICD-10-CM

## 2017-09-12 LAB — POCT URINALYSIS DIPSTICK
Blood, UA: NEGATIVE
Glucose, UA: NEGATIVE
Ketones, UA: NEGATIVE
Leukocytes, UA: NEGATIVE
Nitrite, UA: NEGATIVE
Protein, UA: POSITIVE — AB

## 2017-09-12 NOTE — Patient Instructions (Signed)
Samantha Sandoval, I greatly value your feedback.  If you receive a survey following your visit with us today, we appreciate you taking the time to fill it out.  Thanks, Joellyn HaffKim Aubrei Bouchie, CNM, WHNP-BC   Second Trimester of Pregnancy The second trimester is from week 14 through week 27 (months 4 through 6). The second trimester is often a time when you feel your best. Your body has adjusted to being pregnant, and you begin to feel better physically. Usually, morning sickness has lessened or quit completely, you may have more energy, and you may have an increase in appetite. The second trimester is also a time when the fetus is growing rapidly. At the end of the sixth month, the fetus is about 9 inches long and weighs about 1 pounds. You will likely begin to feel the baby move (quickening) between 16 and 20 weeks of pregnancy. Body changes during your second trimester Your body continues to go through many changes during your second trimester. The changes vary from woman to woman.  Your weight will continue to increase. You will notice your lower abdomen bulging out.  You may begin to get stretch marks on your hips, abdomen, and breasts.  You may develop headaches that can be relieved by medicines. The medicines should be approved by your health care provider.  You may urinate more often because the fetus is pressing on your bladder.  You may develop or continue to have heartburn as a result of your pregnancy.  You may develop constipation because certain hormones are causing the muscles that push waste through your intestines to slow down.  You may develop hemorrhoids or swollen, bulging veins (varicose veins).  You may have back pain. This is caused by: ? Weight gain. ? Pregnancy hormones that are relaxing the joints in your pelvis. ? A shift in weight and the muscles that support your balance.  Your breasts will continue to grow and they will continue to become tender.  Your gums may bleed and  may be sensitive to brushing and flossing.  Dark spots or blotches (chloasma, mask of pregnancy) may develop on your face. This will likely fade after the baby is born.  A dark line from your belly button to the pubic area (linea nigra) may appear. This will likely fade after the baby is born.  You may have changes in your hair. These can include thickening of your hair, rapid growth, and changes in texture. Some women also have hair loss during or after pregnancy, or hair that feels dry or thin. Your hair will most likely return to normal after your baby is born.  What to expect at prenatal visits During a routine prenatal visit:  You will be weighed to make sure you and the fetus are growing normally.  Your blood pressure will be taken.  Your abdomen will be measured to track your baby's growth.  The fetal heartbeat will be listened to.  Any test results from the previous visit will be discussed.  Your health care provider may ask you:  How you are feeling.  If you are feeling the baby move.  If you have had any abnormal symptoms, such as leaking fluid, bleeding, severe headaches, or abdominal cramping.  If you are using any tobacco products, including cigarettes, chewing tobacco, and electronic cigarettes.  If you have any questions.  Other tests that may be performed during your second trimester include:  Blood tests that check for: ? Low iron levels (anemia). ? High blood  sugar that affects pregnant women (gestational diabetes) between 55 and 28 weeks. ? Rh antibodies. This is to check for a protein on red blood cells (Rh factor).  Urine tests to check for infections, diabetes, or protein in the urine.  An ultrasound to confirm the proper growth and development of the baby.  An amniocentesis to check for possible genetic problems.  Fetal screens for spina bifida and Down syndrome.  HIV (human immunodeficiency virus) testing. Routine prenatal testing includes  screening for HIV, unless you choose not to have this test.  Follow these instructions at home: Medicines  Follow your health care provider's instructions regarding medicine use. Specific medicines may be either safe or unsafe to take during pregnancy.  Take a prenatal vitamin that contains at least 600 micrograms (mcg) of folic acid.  If you develop constipation, try taking a stool softener if your health care provider approves. Eating and drinking  Eat a balanced diet that includes fresh fruits and vegetables, whole grains, good sources of protein such as meat, eggs, or tofu, and low-fat dairy. Your health care provider will help you determine the amount of weight gain that is right for you.  Avoid raw meat and uncooked cheese. These carry germs that can cause birth defects in the baby.  If you have low calcium intake from food, talk to your health care provider about whether you should take a daily calcium supplement.  Limit foods that are high in fat and processed sugars, such as fried and sweet foods.  To prevent constipation: ? Drink enough fluid to keep your urine clear or pale yellow. ? Eat foods that are high in fiber, such as fresh fruits and vegetables, whole grains, and beans. Activity  Exercise only as directed by your health care provider. Most women can continue their usual exercise routine during pregnancy. Try to exercise for 30 minutes at least 5 days a week. Stop exercising if you experience uterine contractions.  Avoid heavy lifting, wear low heel shoes, and practice good posture.  A sexual relationship may be continued unless your health care provider directs you otherwise. Relieving pain and discomfort  Wear a good support bra to prevent discomfort from breast tenderness.  Take warm sitz baths to soothe any pain or discomfort caused by hemorrhoids. Use hemorrhoid cream if your health care provider approves.  Rest with your legs elevated if you have leg cramps  or low back pain.  If you develop varicose veins, wear support hose. Elevate your feet for 15 minutes, 3-4 times a day. Limit salt in your diet. Prenatal Care  Write down your questions. Take them to your prenatal visits.  Keep all your prenatal visits as told by your health care provider. This is important. Safety  Wear your seat belt at all times when driving.  Make a list of emergency phone numbers, including numbers for family, friends, the hospital, and police and fire departments. General instructions  Ask your health care provider for a referral to a local prenatal education class. Begin classes no later than the beginning of month 6 of your pregnancy.  Ask for help if you have counseling or nutritional needs during pregnancy. Your health care provider can offer advice or refer you to specialists for help with various needs.  Do not use hot tubs, steam rooms, or saunas.  Do not douche or use tampons or scented sanitary pads.  Do not cross your legs for long periods of time.  Avoid cat litter boxes and soil used  by cats. These carry germs that can cause birth defects in the baby and possibly loss of the fetus by miscarriage or stillbirth.  Avoid all smoking, herbs, alcohol, and unprescribed drugs. Chemicals in these products can affect the formation and growth of the baby.  Do not use any products that contain nicotine or tobacco, such as cigarettes and e-cigarettes. If you need help quitting, ask your health care provider.  Visit your dentist if you have not gone yet during your pregnancy. Use a soft toothbrush to brush your teeth and be gentle when you floss. Contact a health care provider if:  You have dizziness.  You have mild pelvic cramps, pelvic pressure, or nagging pain in the abdominal area.  You have persistent nausea, vomiting, or diarrhea.  You have a bad smelling vaginal discharge.  You have pain when you urinate. Get help right away if:  You have a  fever.  You are leaking fluid from your vagina.  You have spotting or bleeding from your vagina.  You have severe abdominal cramping or pain.  You have rapid weight gain or weight loss.  You have shortness of breath with chest pain.  You notice sudden or extreme swelling of your face, hands, ankles, feet, or legs.  You have not felt your baby move in over an hour.  You have severe headaches that do not go away when you take medicine.  You have vision changes. Summary  The second trimester is from week 14 through week 27 (months 4 through 6). It is also a time when the fetus is growing rapidly.  Your body goes through many changes during pregnancy. The changes vary from woman to woman.  Avoid all smoking, herbs, alcohol, and unprescribed drugs. These chemicals affect the formation and growth your baby.  Do not use any tobacco products, such as cigarettes, chewing tobacco, and e-cigarettes. If you need help quitting, ask your health care provider.  Contact your health care provider if you have any questions. Keep all prenatal visits as told by your health care provider. This is important. This information is not intended to replace advice given to you by your health care provider. Make sure you discuss any questions you have with your health care provider. Document Released: 02/14/2001 Document Revised: 07/29/2015 Document Reviewed: 04/23/2012 Elsevier Interactive Patient Education  2017 [employer].

## 2017-09-12 NOTE — Progress Notes (Signed)
LOW-RISK PREGNANCY VISIT Patient name: Samantha Sandoval MRN [id number]  Date of birth: 08/16/1995 Chief Complaint:   low risk ob  History of Present Illness:   Samantha Sandoval is a 22 y.o. 733P1011 female at 3551w6d with an Estimated Date of Delivery: 01/24/18 being seen today for ongoing management of a low-risk pregnancy.  Today she reports had fingerstick injury 09/10/17 at phlebotomy class. Was sticking another student w/ butterfly needle, didn't get vein, retracted needle, but it poked her in finger. Teacher instructed her to run water over it and squeeze the blood, then wash hands, said she did not need any further work-up as did not puncture other girls vein/needle didn't have blood on it. Contractions: Not present.  .  Movement: Present. denies leaking of fluid. Review of Systems:   Pertinent items are noted in HPI Denies abnormal vaginal discharge w/ itching/odor/irritation, headaches, visual changes, shortness of breath, chest pain, abdominal pain, severe nausea/vomiting, or problems with urination or bowel movements unless otherwise stated above. Pertinent History Reviewed:  Reviewed past medical,surgical, social, obstetrical and family history.  Reviewed problem list, medications and allergies. Physical Assessment:   Vitals:   09/12/17 1159  BP: 115/66  Pulse: (!) 113  Weight: 204 lb 4.8 oz (92.7 kg)  Body mass index is 36.19 kg/m.        Physical Examination:   General appearance: Well appearing, and in no distress  Mental status: Alert, oriented to person, place, and time  Skin: Warm & dry  Cardiovascular: Normal heart rate noted  Respiratory: Normal respiratory effort, no distress  Abdomen: Soft, gravid, nontender  Pelvic: Cervical exam deferred         Extremities: Edema: None  Fetal Status: Fetal Heart Rate (bpm): 150   Movement: Present    Results for orders placed or performed in visit on 09/12/17 (from the past 24 hour(s))  POCT urinalysis dipstick   Collection Time:  09/12/17 12:05 PM  Result Value Ref Range   Color, UA     Clarity, UA     Glucose, UA Negative Negative   Bilirubin, UA     Ketones, UA neg    Spec Grav, UA  1.010 - 1.025   Blood, UA neg    pH, UA  5.0 - 8.0   Protein, UA Positive (A) Negative   Urobilinogen, UA  0.2 or 1.0 E.U./dL   Nitrite, UA neg    Leukocytes, UA Negative Negative   Appearance     Odor      Assessment & Plan:  1) Low-risk pregnancy G3P1011 at 3951w6d with an Estimated Date of Delivery: 01/24/18   2) Recent needlestick injury, will get hiv, hep b&c today, then repeat at a later date  3) Laser ablation cervix Jan> CL q 2wks until 24wks, was 3+ last u/s   Meds: No orders of the defined types were placed in this encounter.  Labs/procedures today: hiv, hep b&c  Plan:  Continue routine obstetrical care   Reviewed: Preterm labor symptoms and general obstetric precautions including but not limited to vaginal bleeding, contractions, leaking of fluid and fetal movement were reviewed in detail with the patient.  All questions were answered  Follow-up: Return in about 2 weeks (around 09/26/2017) for LROB, US:OB F/U anatomy/CL.  Orders Placed This Encounter  Procedures  . US OB Follow Up  . HIV antibody  . Hepatitis B surface antigen  . Hepatitis C antibody  . POCT urinalysis dipstick   Cheral MarkerKimberly R Cortni Tays CNM, [facility] CenterWHNP-BC 09/12/2017 12:32  PM  

## 2017-09-13 LAB — HIV ANTIBODY (ROUTINE TESTING W REFLEX): HIV Screen 4th Generation wRfx: NONREACTIVE

## 2017-09-13 LAB — HEPATITIS B SURFACE ANTIGEN: Hepatitis B Surface Ag: NEGATIVE

## 2017-09-13 LAB — HEPATITIS C ANTIBODY: Hep C Virus Ab: <0.1 {s_co_ratio} (ref 0.0–0.9)

## 2017-10-08 ENCOUNTER — Other Ambulatory Visit: Payer: Self-pay | Admitting: Women's Health

## 2017-10-08 DIAGNOSIS — Z9889 Other specified postprocedural states: Secondary | ICD-10-CM

## 2017-10-08 DIAGNOSIS — O3441 Maternal care for other abnormalities of cervix, first trimester: Secondary | ICD-10-CM

## 2017-10-08 DIAGNOSIS — Z3482 Encounter for supervision of other normal pregnancy, second trimester: Secondary | ICD-10-CM

## 2017-10-08 DIAGNOSIS — Z362 Encounter for other antenatal screening follow-up: Secondary | ICD-10-CM

## 2017-10-09 ENCOUNTER — Ambulatory Visit (INDEPENDENT_AMBULATORY_CARE_PROVIDER_SITE_OTHER): Payer: BLUE CROSS/BLUE SHIELD | Admitting: Women's Health

## 2017-10-09 ENCOUNTER — Encounter: Payer: Self-pay | Admitting: Women's Health

## 2017-10-09 ENCOUNTER — Ambulatory Visit (INDEPENDENT_AMBULATORY_CARE_PROVIDER_SITE_OTHER): Payer: BLUE CROSS/BLUE SHIELD

## 2017-10-09 VITALS — BP 114/76 | HR 116 | Wt 208.0 lb

## 2017-10-09 DIAGNOSIS — Z9889 Other specified postprocedural states: Secondary | ICD-10-CM

## 2017-10-09 DIAGNOSIS — Z3482 Encounter for supervision of other normal pregnancy, second trimester: Secondary | ICD-10-CM

## 2017-10-09 DIAGNOSIS — Z3A24 24 weeks gestation of pregnancy: Secondary | ICD-10-CM

## 2017-10-09 DIAGNOSIS — O3441 Maternal care for other abnormalities of cervix, first trimester: Secondary | ICD-10-CM

## 2017-10-09 DIAGNOSIS — Z362 Encounter for other antenatal screening follow-up: Secondary | ICD-10-CM

## 2017-10-09 DIAGNOSIS — Z1389 Encounter for screening for other disorder: Secondary | ICD-10-CM

## 2017-10-09 DIAGNOSIS — Z331 Pregnant state, incidental: Secondary | ICD-10-CM

## 2017-10-09 LAB — POCT URINALYSIS DIPSTICK OB
Blood, UA: NEGATIVE
Glucose, UA: NEGATIVE — AB
Ketones, UA: NEGATIVE
Leukocytes, UA: NEGATIVE
Nitrite, UA: NEGATIVE
POC,PROTEIN,UA: NEGATIVE

## 2017-10-09 MED ORDER — DOXYLAMINE-PYRIDOXINE 10-10 MG PO TBEC: DELAYED_RELEASE_TABLET | ORAL | 6 refills | Status: DC

## 2017-10-09 NOTE — Progress Notes (Signed)
US TA/TV: 24+5 wks,cephalic,CX length w and w/o pressure 3.6 cm,anterior pl gr 1,SVP of fluid 6.8 cm,normal ovaries bilat,fhr 152 bpm,efw 819 g 75%,anatomy of the heart complete,no obvious abnormalities

## 2017-10-09 NOTE — Progress Notes (Signed)
LOW-RISK PREGNANCY VISIT Patient name: Samantha PonsRachel Sandoval MRN [id number]  Date of birth: 02/02/1996 Chief Complaint:   low risk ob (ultrasound)  History of Present Illness:   Samantha Sandoval is a 22 y.o. 203P1011 female at 1895w5d with an Estimated Date of Delivery: 01/24/18 being seen today for ongoing management of a low-risk pregnancy.  Today she reports no complaints. Contractions: Not present.  .  Movement: Present. denies leaking of fluid. Review of Systems:   Pertinent items are noted in HPI Denies abnormal vaginal discharge w/ itching/odor/irritation, headaches, visual changes, shortness of breath, chest pain, abdominal pain, severe nausea/vomiting, or problems with urination or bowel movements unless otherwise stated above. Pertinent History Reviewed:  Reviewed past medical,surgical, social, obstetrical and family history.  Reviewed problem list, medications and allergies. Physical Assessment:   Vitals:   10/09/17 1049  BP: 114/76  Pulse: (!) 116  Weight: 208 lb (94.3 kg)  Body mass index is 36.85 kg/m.        Physical Examination:   General appearance: Well appearing, and in no distress  Mental status: Alert, oriented to person, place, and time  Skin: Warm & dry  Cardiovascular: Normal heart rate noted  Respiratory: Normal respiratory effort, no distress  Abdomen: Soft, gravid, nontender  Pelvic: Cervical exam deferred         Extremities: Edema: None  Fetal Status:     Movement: Present    US TA/TV: 24+5 wks,cephalic,CX length w and w/o pressure 3.6 cm,anterior pl gr 1,SVP of fluid 6.8 cm,normal ovaries bilat,fhr 152 bpm,efw 819 g 75%,anatomy of the heart complete,no obvious abnormalities   Results for orders placed or performed in visit on 10/09/17 (from the past 24 hour(s))  POC Urinalysis Dipstick OB   Collection Time: 10/09/17 10:57 AM  Result Value Ref Range   Color, UA     Clarity, UA     Glucose, UA Negative (A) (none)   Bilirubin, UA     Ketones, UA neg    Spec  Grav, UA  1.010 - 1.025   Blood, UA neg    pH, UA  5.0 - 8.0   POC Protein UA Negative Negative, Trace   Urobilinogen, UA  0.2 or 1.0 E.U./dL   Nitrite, UA neg    Leukocytes, UA Negative Negative   Appearance     Odor      Assessment & Plan:  1) Low-risk pregnancy G3P1011 at 2795w5d with an Estimated Date of Delivery: 01/24/18   2) S/P laser ablation of cervix, CL normal today, no further CL needed  3) Needlestick injury 09/10/17> initial labs normal, will repeat w/ PN2 (add hep b&c, already getting HIV)   Meds:  Meds ordered this encounter  Medications  . Doxylamine-Pyridoxine (DICLEGIS) 10-10 MG TBEC    Sig: 2 tabs q hs, if sx persist add 1 tab q am on day 3, if sx persist add 1 tab q afternoon on day 4    Dispense:  100 tablet    Refill:  6    Order Specific Question:   Supervising Provider    Answer:   Lazaro ArmsEURE, LUTHER H [[id number]]   Labs/procedures today: CL and f/u anatomy u/s  Plan:  Continue routine obstetrical care   Reviewed: Preterm labor symptoms and general obstetric precautions including but not limited to vaginal bleeding, contractions, leaking of fluid and fetal movement were reviewed in detail with the patient.  All questions were answered  Follow-up: Return in about 3 weeks (around 11/01/2017) for LROB, PN2.  Orders Placed This Encounter  Procedures  . POC Urinalysis Dipstick OB   Cheral Marker CNM, [facility] 10/09/2017 11:22 AM

## 2017-10-09 NOTE — Patient Instructions (Signed)
Samantha Sandoval, I greatly value your feedback.  If you receive a survey following your visit with us today, we appreciate you taking the time to fill it out.  Thanks, Joellyn HaffKim Palin Tristan, CNM, WHNP-BC   You will have your sugar test next visit.  Please do not eat or drink anything after midnight the night before you come, not even water.  You will be here for at least two hours.     Call the office [phone]) or [facility] to [facility] if:  You begin to have strong, frequent contractions  Your water breaks.  Sometimes it is a big gush of fluid, sometimes it is just a trickle that keeps getting your panties wet or running down your legs  You have vaginal bleeding.  It is normal to have a small amount of spotting if your cervix was checked.   You don't feel your baby moving like normal.  If you don't, get you something to eat and drink and lay down and focus on feeling your baby move.   If your baby is still not moving like normal, you should call the office or go to [facility].  Second Trimester of Pregnancy The second trimester is from week 13 through week 28, months 4 through 6. The second trimester is often a time when you feel your best. Your body has also adjusted to being pregnant, and you begin to feel better physically. Usually, morning sickness has lessened or quit completely, you may have more energy, and you may have an increase in appetite. The second trimester is also a time when the fetus is growing rapidly. At the end of the sixth month, the fetus is about 9 inches long and weighs about 1 pounds. You will likely begin to feel the baby move (quickening) between 18 and 20 weeks of the pregnancy. BODY CHANGES Your body goes through many changes during pregnancy. The changes vary from woman to woman.   Your weight will continue to increase. You will notice your lower abdomen bulging out.  You may begin to get stretch marks on your hips, abdomen, and breasts.  You may develop headaches  that can be relieved by medicines approved by your health care provider.  You may urinate more often because the fetus is pressing on your bladder.  You may develop or continue to have heartburn as a result of your pregnancy.  You may develop constipation because certain hormones are causing the muscles that push waste through your intestines to slow down.  You may develop hemorrhoids or swollen, bulging veins (varicose veins).  You may have back pain because of the weight gain and pregnancy hormones relaxing your joints between the bones in your pelvis and as a result of a shift in weight and the muscles that support your balance.  Your breasts will continue to grow and be tender.  Your gums may bleed and may be sensitive to brushing and flossing.  Dark spots or blotches (chloasma, mask of pregnancy) may develop on your face. This will likely fade after the baby is born.  A dark line from your belly button to the pubic area (linea nigra) may appear. This will likely fade after the baby is born.  You may have changes in your hair. These can include thickening of your hair, rapid growth, and changes in texture. Some women also have hair loss during or after pregnancy, or hair that feels dry or thin. Your hair will most likely return to normal after your baby  is born. WHAT TO EXPECT AT YOUR PRENATAL VISITS During a routine prenatal visit:  You will be weighed to make sure you and the fetus are growing normally.  Your blood pressure will be taken.  Your abdomen will be measured to track your baby's growth.  The fetal heartbeat will be listened to.  Any test results from the previous visit will be discussed. Your health care provider may ask you:  How you are feeling.  If you are feeling the baby move.  If you have had any abnormal symptoms, such as leaking fluid, bleeding, severe headaches, or abdominal cramping.  If you have any questions. Other tests that may be performed  during your second trimester include:  Blood tests that check for:  Low iron levels (anemia).  Gestational diabetes (between 24 and 28 weeks).  Rh antibodies.  Urine tests to check for infections, diabetes, or protein in the urine.  An ultrasound to confirm the proper growth and development of the baby.  An amniocentesis to check for possible genetic problems.  Fetal screens for spina bifida and Down syndrome. HOME CARE INSTRUCTIONS   Avoid all smoking, herbs, alcohol, and unprescribed drugs. These chemicals affect the formation and growth of the baby.  Follow your health care provider's instructions regarding medicine use. There are medicines that are either safe or unsafe to take during pregnancy.  Exercise only as directed by your health care provider. Experiencing uterine cramps is a good sign to stop exercising.  Continue to eat regular, healthy meals.  Wear a good support bra for breast tenderness.  Do not use hot tubs, steam rooms, or saunas.  Wear your seat belt at all times when driving.  Avoid raw meat, uncooked cheese, cat litter boxes, and soil used by cats. These carry germs that can cause birth defects in the baby.  Take your prenatal vitamins.  Try taking a stool softener (if your health care provider approves) if you develop constipation. Eat more high-fiber foods, such as fresh vegetables or fruit and whole grains. Drink plenty of fluids to keep your urine clear or pale yellow.  Take warm sitz baths to soothe any pain or discomfort caused by hemorrhoids. Use hemorrhoid cream if your health care provider approves.  If you develop varicose veins, wear support hose. Elevate your feet for 15 minutes, 3-4 times a day. Limit salt in your diet.  Avoid heavy lifting, wear low heel shoes, and practice good posture.  Rest with your legs elevated if you have leg cramps or low back pain.  Visit your dentist if you have not gone yet during your pregnancy. Use a soft  toothbrush to brush your teeth and be gentle when you floss.  A sexual relationship may be continued unless your health care provider directs you otherwise.  Continue to go to all your prenatal visits as directed by your health care provider. SEEK MEDICAL CARE IF:   You have dizziness.  You have mild pelvic cramps, pelvic pressure, or nagging pain in the abdominal area.  You have persistent nausea, vomiting, or diarrhea.  You have a bad smelling vaginal discharge.  You have pain with urination. SEEK IMMEDIATE MEDICAL CARE IF:   You have a fever.  You are leaking fluid from your vagina.  You have spotting or bleeding from your vagina.  You have severe abdominal cramping or pain.  You have rapid weight gain or loss.  You have shortness of breath with chest pain.  You notice sudden or extreme swelling  of your face, hands, ankles, feet, or legs.  You have not felt your baby move in over an hour.  You have severe headaches that do not go away with medicine.  You have vision changes. Document Released: 02/14/2001 Document Revised: 02/25/2013 Document Reviewed: 04/23/2012 [facility] Patient Information 2015 [place], [place]. This information is not intended to replace advice given to you by your health care provider. Make sure you discuss any questions you have with your health care provider.

## 2017-10-30 ENCOUNTER — Encounter: Payer: BLUE CROSS/BLUE SHIELD | Admitting: Obstetrics and Gynecology

## 2017-10-30 ENCOUNTER — Other Ambulatory Visit: Payer: BLUE CROSS/BLUE SHIELD

## 2017-10-30 ENCOUNTER — Encounter: Payer: BLUE CROSS/BLUE SHIELD | Admitting: Adult Health

## 2017-11-12 ENCOUNTER — Other Ambulatory Visit: Payer: BLUE CROSS/BLUE SHIELD

## 2017-11-12 ENCOUNTER — Ambulatory Visit (INDEPENDENT_AMBULATORY_CARE_PROVIDER_SITE_OTHER): Payer: BLUE CROSS/BLUE SHIELD | Admitting: Obstetrics & Gynecology

## 2017-11-12 VITALS — BP 110/72 | HR 96 | Wt 213.5 lb

## 2017-11-12 DIAGNOSIS — Z3A29 29 weeks gestation of pregnancy: Secondary | ICD-10-CM

## 2017-11-12 DIAGNOSIS — Z131 Encounter for screening for diabetes mellitus: Secondary | ICD-10-CM

## 2017-11-12 DIAGNOSIS — Z3483 Encounter for supervision of other normal pregnancy, third trimester: Secondary | ICD-10-CM

## 2017-11-12 DIAGNOSIS — Z331 Pregnant state, incidental: Secondary | ICD-10-CM

## 2017-11-12 DIAGNOSIS — Z1389 Encounter for screening for other disorder: Secondary | ICD-10-CM

## 2017-11-12 DIAGNOSIS — Z3403 Encounter for supervision of normal first pregnancy, third trimester: Secondary | ICD-10-CM

## 2017-11-12 LAB — POCT URINALYSIS DIPSTICK OB
Blood, UA: NEGATIVE
Glucose, UA: NEGATIVE
Ketones, UA: NEGATIVE
Leukocytes, UA: NEGATIVE
Nitrite, UA: NEGATIVE
POC,PROTEIN,UA: NEGATIVE

## 2017-11-12 NOTE — Progress Notes (Signed)
   LOW-RISK PREGNANCY VISIT Patient name: Samantha Sandoval MRN [id number]  Date of birth: 12/19/1995 Chief Complaint:   Routine Prenatal Visit (PN2)  History of Present Illness:   Samantha Sandoval is a 22 y.o. G85P1011 female at [redacted]w[redacted]d with an Estimated Date of Delivery: 01/24/18 being seen today for ongoing management of a low-risk pregnancy.  Today she reports had a panic attack. Contractions: Not present. Vag. Bleeding: None.  Movement: Present. denies leaking of fluid. Review of Systems:   Pertinent items are noted in HPI Denies abnormal vaginal discharge w/ itching/odor/irritation, headaches, visual changes, shortness of breath, chest pain, abdominal pain, severe nausea/vomiting, or problems with urination or bowel movements unless otherwise stated above. Pertinent History Reviewed:  Reviewed past medical,surgical, social, obstetrical and family history.  Reviewed problem list, medications and allergies. Physical Assessment:   Vitals:   11/12/17 0836  BP: 110/72  Pulse: 96  Weight: 213 lb 8 oz (96.8 kg)  Body mass index is 37.82 kg/m.        Physical Examination:   General appearance: Well appearing, and in no distress  Mental status: Alert, oriented to person, place, and time  Skin: Warm & dry  Cardiovascular: Normal heart rate noted  Respiratory: Normal respiratory effort, no distress  Abdomen: Soft, gravid, nontender  Pelvic: Cervical exam deferred         Extremities: Edema: None  Fetal Status:     Movement: Present    Results for orders placed or performed in visit on 11/12/17 (from the past 24 hour(s))  POC Urinalysis Dipstick OB   Collection Time: 11/12/17  8:39 AM  Result Value Ref Range   Color, UA     Clarity, UA     Glucose, UA Negative Negative   Bilirubin, UA     Ketones, UA neg    Spec Grav, UA     Blood, UA neg    pH, UA     POC Protein UA Negative Negative, Trace   Urobilinogen, UA     Nitrite, UA neg    Leukocytes, UA Negative Negative   Appearance     Odor      Assessment & Plan:  1) Low-risk pregnancy G3P1011 at [redacted]w[redacted]d with an Estimated Date of Delivery: 01/24/18   2) Hx of  Laser of cervix with short interval pregnancy, cervical lengths have been good   Meds: No orders of the defined types were placed in this encounter.  Labs/procedures today: PN2 today  Plan:  Continue routine obstetrical care   Reviewed: Preterm labor symptoms and general obstetric precautions including but not limited to vaginal bleeding, contractions, leaking of fluid and fetal movement were reviewed in detail with the patient.  All questions were answered  Follow-up: Return in about 3 weeks (around 12/03/2017) for LROB.  Orders Placed This Encounter  Procedures  . POC Urinalysis Dipstick OB   Lazaro Arms  11/12/2017 9:14 AM

## 2017-11-13 LAB — HIV ANTIBODY (ROUTINE TESTING W REFLEX): HIV Screen 4th Generation wRfx: NONREACTIVE

## 2017-11-13 LAB — CBC
Hematocrit: 35.1 % (ref 34.0–46.6)
Hemoglobin: 11.6 g/dL (ref 11.1–15.9)
MCH: 27.5 pg (ref 26.6–33.0)
MCHC: 33 g/dL (ref 31.5–35.7)
MCV: 83 fL (ref 79–97)
Platelets: 235 10*3/uL (ref 150–450)
RBC: 4.22 x10E6/uL (ref 3.77–5.28)
RDW: 12.7 % (ref 12.3–15.4)
WBC: 7.1 10*3/uL (ref 3.4–10.8)

## 2017-11-13 LAB — GLUCOSE TOLERANCE, 2 HOURS W/ 1HR
Glucose, 1 hour: 125 mg/dL (ref 65–179)
Glucose, 2 hour: 101 mg/dL (ref 65–152)
Glucose, Fasting: 88 mg/dL (ref 65–91)

## 2017-11-13 LAB — ANTIBODY SCREEN: Antibody Screen: NEGATIVE

## 2017-11-13 LAB — RPR: RPR Ser Ql: NONREACTIVE

## 2017-11-14 ENCOUNTER — Telehealth: Payer: Self-pay | Admitting: Obstetrics & Gynecology

## 2017-11-14 NOTE — Telephone Encounter (Signed)
Pt called for results of gtt. DOB verified. Informed pt that results were normal.

## 2017-12-03 ENCOUNTER — Encounter: Payer: Self-pay | Admitting: Women's Health

## 2017-12-03 ENCOUNTER — Ambulatory Visit (INDEPENDENT_AMBULATORY_CARE_PROVIDER_SITE_OTHER): Payer: BLUE CROSS/BLUE SHIELD | Admitting: Women's Health

## 2017-12-03 VITALS — BP 107/70 | HR 80 | Wt 212.0 lb

## 2017-12-03 DIAGNOSIS — Z1389 Encounter for screening for other disorder: Secondary | ICD-10-CM | POA: Diagnosis not present

## 2017-12-03 DIAGNOSIS — Z23 Encounter for immunization: Secondary | ICD-10-CM

## 2017-12-03 DIAGNOSIS — Z3483 Encounter for supervision of other normal pregnancy, third trimester: Secondary | ICD-10-CM

## 2017-12-03 DIAGNOSIS — Z331 Pregnant state, incidental: Secondary | ICD-10-CM | POA: Diagnosis not present

## 2017-12-03 DIAGNOSIS — Z3A32 32 weeks gestation of pregnancy: Secondary | ICD-10-CM

## 2017-12-03 LAB — POCT URINALYSIS DIPSTICK OB
Blood, UA: NEGATIVE
Glucose, UA: NEGATIVE
Ketones, UA: NEGATIVE
Nitrite, UA: NEGATIVE
POC,PROTEIN,UA: NEGATIVE

## 2017-12-03 NOTE — Patient Instructions (Signed)
Samantha Sandoval, I greatly value your feedback.  If you receive a survey following your visit with [place] today, we appreciate you taking the time to fill it out.  Thanks, [name], CNM, WHNP-BC   Call the office [phone]) or go to [facility] if:  You begin to have strong, frequent contractions  Your water breaks.  Sometimes it is a big gush of fluid, sometimes it is just a trickle that keeps getting your panties wet or running down your legs  You have vaginal bleeding.  It is normal to have a small amount of spotting if your cervix was checked.   You don't feel your baby moving like normal.  If you don't, get you something to eat and drink and lay down and focus on feeling your baby move.  You should feel at least 10 movements in 2 hours.  If you don't, you should call the office or go to [facility]   Preterm Labor and Birth Information The normal length of a pregnancy is 39-41 weeks. Preterm labor is when labor starts before 37 completed weeks of pregnancy. What are the risk factors for preterm labor? Preterm labor is more likely to occur in women who:  Have certain infections during pregnancy such as a bladder infection, sexually transmitted infection, or infection inside the uterus (chorioamnionitis).  Have a shorter-than-normal cervix.  Have gone into preterm labor before.  Have had surgery on their cervix.  Are younger than age 84 or older than age 47.  Are African American.  Are pregnant with twins or multiple babies (multiple gestation).  Take street drugs or smoke while pregnant.  Do not gain enough weight while pregnant.  Became pregnant shortly after having been pregnant.  What are the symptoms of preterm labor? Symptoms of preterm labor include:  Cramps similar to those that can happen during a menstrual period. The cramps may happen with diarrhea.  Pain in the abdomen or lower back.  Regular uterine contractions that may feel like tightening of the  abdomen.  A feeling of increased pressure in the pelvis.  Increased watery or bloody mucus discharge from the vagina.  Water breaking (ruptured amniotic sac).  Why is it important to recognize signs of preterm labor? It is important to recognize signs of preterm labor because babies who are born prematurely may not be fully developed. This can put them at an increased risk for:  Long-term (chronic) heart and lung problems.  Difficulty immediately after birth with regulating body systems, including blood sugar, body temperature, heart rate, and breathing rate.  Bleeding in the brain.  Cerebral palsy.  Learning difficulties.  Death.  These risks are highest for babies who are born before 34 weeks of pregnancy. How is preterm labor treated? Treatment depends on the length of your pregnancy, your condition, and the health of your baby. It may involve:  Having a stitch (suture) placed in your cervix to prevent your cervix from opening too early (cerclage).  Taking or being given medicines, such as: ? Hormone medicines. These may be given early in pregnancy to help support the pregnancy. ? Medicine to stop contractions. ? Medicines to help mature the baby's lungs. These may be prescribed if the risk of delivery is high. ? Medicines to prevent your baby from developing cerebral palsy.  If the labor happens before 34 weeks of pregnancy, you may need to stay in the hospital. What should I do if I think I am in preterm labor? If you think that  you are going into preterm labor, call your health care provider right away. How can I prevent preterm labor in future pregnancies? To increase your chance of having a full-term pregnancy:  Do not use any tobacco products, such as cigarettes, chewing tobacco, and e-cigarettes. If you need help quitting, ask your health care provider.  Do not use street drugs or medicines that have not been prescribed to you during your pregnancy.  Talk with  your health care provider before taking any herbal supplements, even if you have been taking them regularly.  Make sure you gain a healthy amount of weight during your pregnancy.  Watch for infection. If you think that you might have an infection, get it checked right away.  Make sure to tell your health care provider if you have gone into preterm labor before.  This information is not intended to replace advice given to you by your health care provider. Make sure you discuss any questions you have with your health care provider. Document Released: 05/13/2003 Document Revised: 08/03/2015 Document Reviewed: 07/14/2015 Elsevier Interactive Patient Education  2018 [employer]  

## 2017-12-03 NOTE — Progress Notes (Signed)
   LOW-RISK PREGNANCY VISIT Patient name: Samantha Sandoval MRN [id number]  Date of birth: [birth year] Chief Complaint:   Routine Prenatal Visit  History of Present Illness:   Samantha Sandoval is a 22 y.o. G54P1011 female at [redacted]w[redacted]d with an Estimated Date of Delivery: 01/24/18 being seen today for ongoing management of a low-risk pregnancy.  Today she reports no complaints. Contractions: Not present. Vag. Bleeding: None.  Movement: Present. denies leaking of fluid. Review of Systems:   Pertinent items are noted in HPI Denies abnormal vaginal discharge w/ itching/odor/irritation, headaches, visual changes, shortness of breath, chest pain, abdominal pain, severe nausea/vomiting, or problems with urination or bowel movements unless otherwise stated above. Pertinent History Reviewed:  Reviewed past medical,surgical, social, obstetrical and family history.  Reviewed problem list, medications and allergies. Physical Assessment:   Vitals:   12/03/17 1128  BP: 107/70  Pulse: 80  Weight: 212 lb (96.2 kg)  Body mass index is 37.55 kg/m.        Physical Examination:   General appearance: Well appearing, and in no distress  Mental status: Alert, oriented to person, place, and time  Skin: Warm & dry  Cardiovascular: Normal heart rate noted  Respiratory: Normal respiratory effort, no distress  Abdomen: Soft, gravid, nontender  Pelvic: Cervical exam deferred         Extremities: Edema: None  Fetal Status: Fetal Heart Rate (bpm): 149 Fundal Height: 33 cm Movement: Present    Results for orders placed or performed in visit on 12/03/17 (from the past 24 hour(s))  POC Urinalysis Dipstick OB   Collection Time: 12/03/17 11:29 AM  Result Value Ref Range   Color, UA     Clarity, UA     Glucose, UA Negative Negative   Bilirubin, UA     Ketones, UA neg    Spec Grav, UA     Blood, UA neg    pH, UA     POC Protein UA Negative Negative, Trace   Urobilinogen, UA     Nitrite, UA neg    Leukocytes, UA Trace  (A) Negative   Appearance     Odor      Assessment & Plan:  1) Low-risk pregnancy G3P1011 at [redacted]w[redacted]d with an Estimated Date of Delivery: 01/24/18    Meds: No orders of the defined types were placed in this encounter.  Labs/procedures today: tdap, flu shot  Plan:  Continue routine obstetrical care   Reviewed: Preterm labor symptoms and general obstetric precautions including but not limited to vaginal bleeding, contractions, leaking of fluid and fetal movement were reviewed in detail with the patient.  All questions were answered  Follow-up: Return in about 2 weeks (around 12/17/2017) for LROB.  Orders Placed This Encounter  Procedures  . Flu Vaccine QUAD 36+ mos IM (Fluarix, Quad PF)  . Tdap vaccine greater than or equal to 7yo IM  . POC Urinalysis Dipstick OB   Cheral Marker CNM, [facility] 12/03/2017 11:55 AM

## 2017-12-18 ENCOUNTER — Other Ambulatory Visit: Payer: Self-pay

## 2017-12-18 ENCOUNTER — Ambulatory Visit (INDEPENDENT_AMBULATORY_CARE_PROVIDER_SITE_OTHER): Payer: BLUE CROSS/BLUE SHIELD | Admitting: Obstetrics & Gynecology

## 2017-12-18 ENCOUNTER — Encounter: Payer: Self-pay | Admitting: Obstetrics & Gynecology

## 2017-12-18 VITALS — BP 115/62 | HR 89 | Wt 214.0 lb

## 2017-12-18 DIAGNOSIS — Z1389 Encounter for screening for other disorder: Secondary | ICD-10-CM

## 2017-12-18 DIAGNOSIS — Z3A34 34 weeks gestation of pregnancy: Secondary | ICD-10-CM

## 2017-12-18 DIAGNOSIS — Z3483 Encounter for supervision of other normal pregnancy, third trimester: Secondary | ICD-10-CM

## 2017-12-18 DIAGNOSIS — Z331 Pregnant state, incidental: Secondary | ICD-10-CM

## 2017-12-18 LAB — POCT URINALYSIS DIPSTICK OB
Blood, UA: NEGATIVE
Glucose, UA: NEGATIVE
Ketones, UA: NEGATIVE
Leukocytes, UA: NEGATIVE
Nitrite, UA: NEGATIVE
POC,PROTEIN,UA: NEGATIVE

## 2017-12-18 NOTE — Progress Notes (Signed)
   LOW-RISK PREGNANCY VISIT Patient name: Samantha Sandoval MRN [id number]  Date of birth: 06/14/1995 Chief Complaint:   Routine Prenatal Visit  History of Present Illness:   Samantha Sandoval is a 22 y.o. G53P1011 female at [redacted]w[redacted]d with an Estimated Date of Delivery: 01/24/18 being seen today for ongoing management of a low-risk pregnancy.  Today she reports no complaints. Contractions: Not present. Vag. Bleeding: None.  Movement: Present. denies leaking of fluid. Review of Systems:   Pertinent items are noted in HPI Denies abnormal vaginal discharge w/ itching/odor/irritation, headaches, visual changes, shortness of breath, chest pain, abdominal pain, severe nausea/vomiting, or problems with urination or bowel movements unless otherwise stated above. Pertinent History Reviewed:  Reviewed past medical,surgical, social, obstetrical and family history.  Reviewed problem list, medications and allergies. Physical Assessment:   Vitals:   12/18/17 1101  BP: 115/62  Pulse: 89  Weight: 214 lb (97.1 kg)  Body mass index is 37.91 kg/m.        Physical Examination:   General appearance: Well appearing, and in no distress  Mental status: Alert, oriented to person, place, and time  Skin: Warm & dry  Cardiovascular: Normal heart rate noted  Respiratory: Normal respiratory effort, no distress  Abdomen: Soft, gravid, nontender  Pelvic: Cervical exam deferred         Extremities: Edema: None  Fetal Status: Fetal Heart Rate (bpm): 143 Fundal Height: 35 cm Movement: Present    Results for orders placed or performed in visit on 12/18/17 (from the past 24 hour(s))  POC Urinalysis Dipstick OB   Collection Time: 12/18/17 11:01 AM  Result Value Ref Range   Color, UA     Clarity, UA     Glucose, UA Negative Negative   Bilirubin, UA     Ketones, UA neg    Spec Grav, UA     Blood, UA neg    pH, UA     POC Protein UA Negative Negative, Trace   Urobilinogen, UA     Nitrite, UA neg    Leukocytes, UA  Negative Negative   Appearance     Odor      Assessment & Plan:  1) Low-risk pregnancy G3P1011 at [redacted]w[redacted]d with an Estimated Date of Delivery: 01/24/18   2) Laser of cervix jsut prior to conception   Meds: No orders of the defined types were placed in this encounter.  Labs/procedures today:   Plan:  Continue routine obstetrical care   Reviewed: Preterm labor symptoms and general obstetric precautions including but not limited to vaginal bleeding, contractions, leaking of fluid and fetal movement were reviewed in detail with the patient.  All questions were answered  Follow-up: Return in about 2 weeks (around 01/01/2018) for GBS, LROB.  Orders Placed This Encounter  Procedures  . POC Urinalysis Dipstick OB   Amaryllis Dyke Wolf Boulay  12/18/2017 11:09 AM

## 2017-12-24 DIAGNOSIS — Z029 Encounter for administrative examinations, unspecified: Secondary | ICD-10-CM

## 2017-12-25 ENCOUNTER — Encounter (HOSPITAL_COMMUNITY): Payer: Self-pay | Admitting: *Deleted

## 2017-12-25 ENCOUNTER — Inpatient Hospital Stay (HOSPITAL_COMMUNITY)
Admission: AD | Admit: 2017-12-25 | Discharge: 2017-12-25 | Disposition: A | Discharge status: Home / Self Care | Payer: Medicaid Other | Source: Ambulatory Visit | Attending: Family Medicine | Admitting: Family Medicine

## 2017-12-25 ENCOUNTER — Other Ambulatory Visit: Payer: Self-pay

## 2017-12-25 DIAGNOSIS — F419 Anxiety disorder, unspecified: Secondary | ICD-10-CM | POA: Diagnosis not present

## 2017-12-25 DIAGNOSIS — O99343 Other mental disorders complicating pregnancy, third trimester: Secondary | ICD-10-CM | POA: Insufficient documentation

## 2017-12-25 DIAGNOSIS — Z87891 Personal history of nicotine dependence: Secondary | ICD-10-CM | POA: Diagnosis not present

## 2017-12-25 DIAGNOSIS — Z3A35 35 weeks gestation of pregnancy: Secondary | ICD-10-CM | POA: Insufficient documentation

## 2017-12-25 DIAGNOSIS — O26893 Other specified pregnancy related conditions, third trimester: Secondary | ICD-10-CM

## 2017-12-25 DIAGNOSIS — Z79899 Other long term (current) drug therapy: Secondary | ICD-10-CM | POA: Diagnosis not present

## 2017-12-25 DIAGNOSIS — O368130 Decreased fetal movements, third trimester, not applicable or unspecified: Secondary | ICD-10-CM | POA: Insufficient documentation

## 2017-12-25 DIAGNOSIS — Z3689 Encounter for other specified antenatal screening: Secondary | ICD-10-CM

## 2017-12-25 DIAGNOSIS — R109 Unspecified abdominal pain: Secondary | ICD-10-CM | POA: Diagnosis present

## 2017-12-25 DIAGNOSIS — R1030 Lower abdominal pain, unspecified: Secondary | ICD-10-CM | POA: Diagnosis present

## 2017-12-25 DIAGNOSIS — O479 False labor, unspecified: Secondary | ICD-10-CM | POA: Diagnosis not present

## 2017-12-25 DIAGNOSIS — M549 Dorsalgia, unspecified: Secondary | ICD-10-CM | POA: Diagnosis not present

## 2017-12-25 DIAGNOSIS — O4703 False labor before 37 completed weeks of gestation, third trimester: Secondary | ICD-10-CM | POA: Insufficient documentation

## 2017-12-25 LAB — URINALYSIS, ROUTINE W REFLEX MICROSCOPIC
Bilirubin Urine: NEGATIVE
Glucose, UA: 50 mg/dL — AB
Hgb urine dipstick: NEGATIVE
Ketones, ur: NEGATIVE mg/dL
Leukocytes, UA: NEGATIVE
Nitrite: NEGATIVE
Protein, ur: NEGATIVE mg/dL
Specific Gravity, Urine: 1.018 (ref 1.005–1.030)
pH: 6 (ref 5.0–8.0)

## 2017-12-25 LAB — WET PREP, GENITAL
Clue Cells Wet Prep HPF POC: NONE SEEN
Sperm: NONE SEEN
Trich, Wet Prep: NONE SEEN
Yeast Wet Prep HPF POC: NONE SEEN

## 2017-12-25 LAB — AMNISURE RUPTURE OF MEMBRANE (ROM) NOT AT ARMC: Amnisure ROM: NEGATIVE

## 2017-12-25 MED ORDER — CYCLOBENZAPRINE HCL 10 MG PO TABS
10.0000 mg | ORAL_TABLET | Freq: Once | ORAL | Status: AC
  Administered 2017-12-25: 10 mg via ORAL
  Filled 2017-12-25: qty 1

## 2017-12-25 NOTE — MAU Note (Signed)
Pt presents to MAU c/o abdominal cramping that has been ongoing since 1400. Pt states the pain will come and go and rates it a 8/10. Pt states the pain radiates to her back as well. Pt is also concerned if her water is broke she states with certain clothing she notices wet spots. Pt reports a DFM.

## 2017-12-25 NOTE — Discharge Instructions (Signed)
Reasons to return to MAU: ° °1.  Contractions are  3-4 minutes apart or less, each last 1 minute, these have been going on for 1-2 hours, and you cannot walk or talk during them °2.  You have a large gush of fluid, or a trickle of fluid that will not stop and you have to wear a pad °3.  You have bleeding that is bright red, heavier than spotting--like menstrual bleeding (spotting can be normal in early labor or after a check of your cervix) °4.  You do not feel the baby moving like he/she normally does ° °

## 2017-12-25 NOTE — MAU Provider Note (Signed)
History     CSN: [id number]  Arrival date and time: 12/25/17 2118   First Provider Initiated Contact with Patient 12/25/17 2206      Chief Complaint  Patient presents with  . Abdominal Pain   Samantha Sandoval is a 22 y.o. G3P1 at [redacted]w[redacted]d who presents to MAU with complaints of abdominal pain, back pain, vaginal pressure and possible LOF. She reports pain and pressure started occurring today around 1400. Describes the abdominal pain as lower abdominal cramping that is intermittent, rates pain 5/10- has not taken any medication for abdominal pain. She describes back pain as lower backache, rates pain 8/10- has not taken medication prior to arrival to MAU. Symptoms is associated with increased vaginal pressure today. She denies hx of PTL or PTD. She denies complications during pregnancy, denies vaginal bleeding. Reports increased vaginal discharge for the past 2 days that she is unsure whether it is her amniotic fluid or not. She denies having a big gush of fluid or having to wear a pad, reports clear spots being in her underwear that are wet where she has to change her underwear multiple times per day. She denies recent IC, N/V, HA or urinary symptoms. She reports feeling good fetal movement since arrival to MAU and being placed on the monitors.    OB History    Gravida  3   Para  1   Term  1   Preterm      AB  1   Living  1     SAB  1   TAB      Ectopic      Multiple  0   Live Births  1           Past Medical History:  Diagnosis Date  . Anxiety   . Vaginal Pap smear, abnormal     Past Surgical History:  Procedure Laterality Date  . ABDOMINAL SURGERY    . CERVICAL ABLATION N/A 03/28/2017   Procedure: Laser Ablation of Cervix;  Surgeon: Lazaro Arms, MD;  Location: [facility];  Service: Gynecology;  Laterality: N/A;  . tubes in ears    . 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 Diabetes Maternal Grandmother   . Hypertension  Maternal Grandmother   . COPD Maternal Grandmother   . Scoliosis Maternal Grandmother   . Heart disease Maternal Grandmother   . Hypertension Maternal Grandfather   . Other Maternal Grandfather        2 stents in heart  . Heart attack Paternal Grandfather   . Congestive Heart Failure Neg Hx     Social History   Tobacco Use  . Smoking status: Former Smoker    Packs/day: 0.25    Types: Cigarettes  . Smokeless tobacco: Never Used  Substance Use Topics  . Alcohol use: No    Comment: none  . Drug use: No    Comment: denies    Allergies:  Allergies  Allergen Reactions  . Hibiclens [Chlorhexidine]     Medications Prior to Admission  Medication Sig Dispense Refill Last Dose  . Doxylamine-Pyridoxine (DICLEGIS) 10-10 MG TBEC 2 tabs q hs, if sx persist add 1 tab q am on day 3, if sx persist add 1 tab q afternoon on day 4 100 tablet 6 Past Month at Unknown time    Review of Systems  Constitutional: Negative.   Respiratory: Negative.   Cardiovascular: Negative.   Gastrointestinal: Positive  for abdominal pain. Negative for constipation, diarrhea, nausea and vomiting.  Genitourinary: Positive for vaginal discharge. Negative for difficulty urinating, dysuria, frequency, pelvic pain and vaginal bleeding.       Possible LOF, vaginal pressure  Musculoskeletal: Positive for back pain.  Neurological: Negative.    Physical Exam   Blood pressure 106/67, pulse 95, resp. rate 16, last menstrual period 04/19/2017, not currently breastfeeding.  Physical Exam  Nursing note and vitals reviewed. Constitutional: She is oriented to person, place, and time. She appears well-developed and well-nourished. No distress.  Cardiovascular: Normal rate, regular rhythm and normal heart sounds.  Respiratory: Effort normal and breath sounds normal. No respiratory distress. She has no wheezes.  GI: Soft.  Gravid appropriate for gestational age, mild contractions palpated  Genitourinary: No bleeding in  the vagina. Vaginal discharge found.  Genitourinary Comments: Pelvic exam: cervix pink, visually closed, small amount of white thin discharge without odor present, negative pooling of amniotic fluid.  Musculoskeletal: Normal range of motion. She exhibits no edema.  Neurological: She is alert and oriented to person, place, and time.  Psychiatric: She has a normal mood and affect. Her behavior is normal. Thought content normal.   Dilation: Closed Effacement (%): Thick Cervical Position: Posterior Presentation: Vertex Exam by:: V Haskell Rihn CNM   FHR: 140/moderate variability/+accels/no decelerations  Toco: 3-5 minutes/ mild by palpation   MAU Course  Procedures  MDM Orders Placed This Encounter  Procedures  . Wet prep, genital  . Urinalysis, Routine w reflex microscopic  . Amnisure rupture of membrane (rom)not at [facility]  . Encourage fluids  . POCT fern test   Recheck cervix in one hour. Hydration. Labs reviewed: Results for orders placed or performed during the hospital encounter of 12/25/17 (from the past 24 hour(s))  Urinalysis, Routine w reflex microscopic     Status: Abnormal   Collection Time: 12/25/17  9:38 PM  Result Value Ref Range   Color, Urine YELLOW YELLOW   APPearance HAZY (A) CLEAR   Specific Gravity, Urine 1.018 1.005 - 1.030   pH 6.0 5.0 - 8.0   Glucose, UA 50 (A) NEGATIVE mg/dL   Hgb urine dipstick NEGATIVE NEGATIVE   Bilirubin Urine NEGATIVE NEGATIVE   Ketones, ur NEGATIVE NEGATIVE mg/dL   Protein, ur NEGATIVE NEGATIVE mg/dL   Nitrite NEGATIVE NEGATIVE   Leukocytes, UA NEGATIVE NEGATIVE  Wet prep, genital     Status: Abnormal   Collection Time: 12/25/17 10:28 PM  Result Value Ref Range   Yeast Wet Prep HPF POC NONE SEEN NONE SEEN   Trich, Wet Prep NONE SEEN NONE SEEN   Clue Cells Wet Prep HPF POC NONE SEEN NONE SEEN   WBC, Wet Prep HPF POC FEW (A) NONE SEEN   Sperm NONE SEEN   Amnisure rupture of membrane (rom)not at [facility]     Status: None   Collection  Time: 12/25/17 10:28 PM  Result Value Ref Range   Amnisure ROM NEGATIVE    Contractions spaced out to 2 UC over 30 minutes. Cervix recheck- noted no cervical change. Pt stable at time of discharge. Discussed reasons to return to MAU. Follow up as scheduled for prenatal appointments.   Assessment and Plan   1. Braxton Hicks contractions   2. Back pain during pregnancy in third trimester   3. [redacted] weeks gestation of pregnancy   4. NST (non-stress test) reactive   5. Decreased fetal movements in third trimester, single or unspecified fetus    Discharge home  NST reactive  Preterm labor precautions and fetal kick counts  Follow up as scheduled  Return to MAU as needed   Follow-up Information    [facility] Follow up.   Specialty:  Obstetrics and Gynecology Why:  Follow up as scheduled for prenatal appointments  Contact information: [address] [place] [address] [phone]          Sharyon Cable CNM 12/25/2017, 23:51 PM

## 2017-12-26 DIAGNOSIS — Z029 Encounter for administrative examinations, unspecified: Secondary | ICD-10-CM

## 2017-12-28 ENCOUNTER — Telehealth: Payer: Self-pay | Admitting: Obstetrics & Gynecology

## 2017-12-28 ENCOUNTER — Other Ambulatory Visit: Payer: Self-pay

## 2017-12-28 ENCOUNTER — Telehealth: Payer: Self-pay | Admitting: *Deleted

## 2017-12-28 ENCOUNTER — Inpatient Hospital Stay (HOSPITAL_COMMUNITY)
Admission: AD | Admit: 2017-12-28 | Discharge: 2017-12-28 | Disposition: A | Discharge status: Home / Self Care | Payer: Medicaid Other | Source: Ambulatory Visit | Attending: Obstetrics and Gynecology | Admitting: Obstetrics and Gynecology

## 2017-12-28 ENCOUNTER — Other Ambulatory Visit: Payer: Self-pay | Admitting: Adult Health

## 2017-12-28 ENCOUNTER — Encounter (HOSPITAL_COMMUNITY): Payer: Self-pay

## 2017-12-28 DIAGNOSIS — Z3689 Encounter for other specified antenatal screening: Secondary | ICD-10-CM

## 2017-12-28 DIAGNOSIS — M549 Dorsalgia, unspecified: Secondary | ICD-10-CM | POA: Insufficient documentation

## 2017-12-28 DIAGNOSIS — Z3A36 36 weeks gestation of pregnancy: Secondary | ICD-10-CM | POA: Insufficient documentation

## 2017-12-28 DIAGNOSIS — Z87891 Personal history of nicotine dependence: Secondary | ICD-10-CM | POA: Diagnosis not present

## 2017-12-28 DIAGNOSIS — R109 Unspecified abdominal pain: Secondary | ICD-10-CM

## 2017-12-28 DIAGNOSIS — O9989 Other specified diseases and conditions complicating pregnancy, childbirth and the puerperium: Secondary | ICD-10-CM | POA: Diagnosis not present

## 2017-12-28 DIAGNOSIS — O368130 Decreased fetal movements, third trimester, not applicable or unspecified: Secondary | ICD-10-CM | POA: Insufficient documentation

## 2017-12-28 DIAGNOSIS — O26899 Other specified pregnancy related conditions, unspecified trimester: Secondary | ICD-10-CM

## 2017-12-28 DIAGNOSIS — O26893 Other specified pregnancy related conditions, third trimester: Secondary | ICD-10-CM | POA: Insufficient documentation

## 2017-12-28 DIAGNOSIS — O99891 Other specified diseases and conditions complicating pregnancy: Secondary | ICD-10-CM

## 2017-12-28 MED ORDER — VALACYCLOVIR HCL 1 G PO TABS: 1000.0000 mg | ORAL_TABLET | Freq: Every day | ORAL | 6 refills | Status: DC

## 2017-12-28 MED ORDER — CYCLOBENZAPRINE HCL 10 MG PO TABS
10.0000 mg | ORAL_TABLET | Freq: Once | ORAL | Status: AC
  Administered 2017-12-28: 10 mg via ORAL
  Filled 2017-12-28: qty 1

## 2017-12-28 NOTE — Telephone Encounter (Signed)
Pt called stating that she has been having constant lower back pain. She states that she just doesn't feel right and states that despite her efforts she has only felt baby move twice this morning. [facility]

## 2017-12-28 NOTE — Telephone Encounter (Deleted)
Pt requesting refill on valtrex. Advised that I would send her request to a provider and she could check with her pharmacy in the next 24 hours.

## 2017-12-28 NOTE — MAU Provider Note (Signed)
History     CSN: [id number]  Arrival date and time: 12/28/17 1402   First Provider Initiated Contact with Patient 12/28/17 1445      Chief Complaint  Patient presents with  . Abdominal Pain  . Decreased Fetal Movement  . Back Pain   HPI  Ms.  Samantha Sandoval is a 22 y.o. year old G56P1011 female at [redacted]w[redacted]d weeks gestation who presents to MAU reporting severe constant back pain, DFM and contractions. She denies VB or LOF. She reports that she had her cervix "lasered and it will not dilate because of scar tissue."  Past Medical History:  Diagnosis Date  . Anxiety   . Vaginal Pap smear, abnormal     Past Surgical History:  Procedure Laterality Date  . ABDOMINAL SURGERY    . CERVICAL ABLATION N/A 03/28/2017   Procedure: Laser Ablation of Cervix;  Surgeon: Lazaro Arms, MD;  Location: [facility];  Service: Gynecology;  Laterality: N/A;  . tubes in ears    . 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 Diabetes Maternal Grandmother   . Hypertension Maternal Grandmother   . COPD Maternal Grandmother   . Scoliosis Maternal Grandmother   . Heart disease Maternal Grandmother   . Hypertension Maternal Grandfather   . Other Maternal Grandfather        2 stents in heart  . Heart attack Paternal Grandfather   . Congestive Heart Failure Neg Hx     Social History   Tobacco Use  . Smoking status: Former Smoker    Packs/day: 0.25    Types: Cigarettes  . Smokeless tobacco: Never Used  Substance Use Topics  . Alcohol use: No    Comment: none  . Drug use: No    Comment: denies    Allergies:  Allergies  Allergen Reactions  . Hibiclens [Chlorhexidine]     Medications Prior to Admission  Medication Sig Dispense Refill Last Dose  . Doxylamine-Pyridoxine (DICLEGIS) 10-10 MG TBEC 2 tabs q hs, if sx persist add 1 tab q am on day 3, if sx persist add 1 tab q afternoon on day 4 100 tablet 6 Past Month at Unknown time  . valACYclovir (VALTREX) 1000 MG  tablet Take 1 tablet (1,000 mg total) by mouth daily. 30 tablet 6     Review of Systems  Constitutional: Negative.   HENT: Negative.   Eyes: Negative.   Respiratory: Negative.   Cardiovascular: Negative.   Gastrointestinal: Negative.   Endocrine: Negative.   Genitourinary: Positive for pelvic pain.       DFM  Musculoskeletal: Positive for back pain.  Skin: Negative.   Allergic/Immunologic: Negative.   Neurological: Negative.   Hematological: Negative.   Psychiatric/Behavioral: Negative.    Physical Exam   Blood pressure 118/62, pulse (!) 111, temperature 98.4 F (36.9 C), temperature source Oral, resp. rate 18, weight 98.5 kg, last menstrual period 04/19/2017, SpO2 99 %. Physical Exam  Nursing note and vitals reviewed. Constitutional: She is oriented to person, place, and time. She appears well-developed and well-nourished.  HENT:  Head: Normocephalic and atraumatic.  Eyes: Pupils are equal, round, and reactive to light.  Neck: Normal range of motion.  Cardiovascular: Normal rate, regular rhythm, normal heart sounds and intact distal pulses.  Respiratory: Effort normal and breath sounds normal.  GI: Soft. Bowel sounds are normal.  Genitourinary:  Genitourinary Comments: Dilation: Closed Effacement (%): 50 Cervical Position: Posterior Station: Ballotable Presentation: Vertex  Exam by: Carloyn Jaeger, CNM   Musculoskeletal: Normal range of motion.  Neurological: She is alert and oriented to person, place, and time. She has normal reflexes.  Skin: Skin is warm and dry.  Psychiatric: She has a normal mood and affect. Her behavior is normal. Judgment and thought content normal.    MAU Course  Procedures  MDM NST - FHR: 140 bpm / moderate variability / accels present / decels absent / TOCO: UI noted Flexeril 10 mg -- no improvement in pain; rated 9/10   Assessment and Plan  Back pain affecting pregnancy in third trimester  - Advised to take Tylenol 1000 mg every 6 hrs prn  pain - Information provided on back pain in pregnancy    Abdominal cramping affecting pregnancy  - Information provided on abd pain in pregnancy   NST (non-stress test) reactive  - Reassurance given that FHR tracing shows good fetal well-being and lots of movement - Advised to conduct daily [facility]   - Discharge patient - Keep scheduled appointment with [facility] on Monday 12/31/2017   Raelyn Mora, MSN, CNM 12/28/2017, 2:45 PM

## 2017-12-28 NOTE — MAU Note (Signed)
Severe back pain- constant, started last night, ongoing  cramping and pressure.  Baby not moving as much today. No bleeding or leaking

## 2017-12-28 NOTE — Telephone Encounter (Signed)
LMOVM returning pts call.  

## 2017-12-28 NOTE — Progress Notes (Addendum)
Error entry

## 2017-12-28 NOTE — Telephone Encounter (Signed)
Patient called stating that she would like to speak with a nurse, Pt did not state the reason why. Please contact pt

## 2017-12-31 ENCOUNTER — Other Ambulatory Visit: Payer: Self-pay

## 2017-12-31 ENCOUNTER — Encounter: Payer: Self-pay | Admitting: Family Medicine

## 2017-12-31 ENCOUNTER — Ambulatory Visit (INDEPENDENT_AMBULATORY_CARE_PROVIDER_SITE_OTHER): Payer: BLUE CROSS/BLUE SHIELD | Admitting: Family Medicine

## 2017-12-31 VITALS — BP 109/51 | HR 86 | Wt 215.0 lb

## 2017-12-31 DIAGNOSIS — Z3483 Encounter for supervision of other normal pregnancy, third trimester: Secondary | ICD-10-CM

## 2017-12-31 DIAGNOSIS — Z3A36 36 weeks gestation of pregnancy: Secondary | ICD-10-CM

## 2017-12-31 DIAGNOSIS — Z1389 Encounter for screening for other disorder: Secondary | ICD-10-CM

## 2017-12-31 DIAGNOSIS — Z331 Pregnant state, incidental: Secondary | ICD-10-CM

## 2017-12-31 DIAGNOSIS — O99820 Streptococcus B carrier state complicating pregnancy: Secondary | ICD-10-CM | POA: Insufficient documentation

## 2017-12-31 LAB — POCT URINALYSIS DIPSTICK OB
Blood, UA: NEGATIVE
Glucose, UA: NEGATIVE
Ketones, UA: NEGATIVE
Leukocytes, UA: NEGATIVE
Nitrite, UA: NEGATIVE
POC,PROTEIN,UA: NEGATIVE

## 2017-12-31 LAB — OB RESULTS CONSOLE GBS: GBS: NEGATIVE

## 2017-12-31 NOTE — Progress Notes (Signed)
   PRENATAL VISIT NOTE  Subjective:  Samantha Sandoval is a 22 y.o. G3P1011 at [redacted]w[redacted]d being seen today for ongoing prenatal care.  She is currently monitored for the following issues for this low-risk pregnancy and has Family history of ovarian cancer; Low grade squamous intraepithelial lesion (LGSIL) on Papanicolaou smear of cervix; Supervision of normal pregnancy; and H/O laser treatment of cervix complicating pregnancy on their problem list.  Patient reports bleeding.  Contractions: Not present. Vag. Bleeding: Scant.  Movement: Present. Denies leaking of fluid.   The following portions of the patient's history were reviewed and updated as appropriate: allergies, current medications, past family history, past medical history, past social history, past surgical history and problem list. Problem list updated.  Objective:   Vitals:   12/31/17 1038  BP: (!) 109/51  Pulse: 86  Weight: 215 lb (97.5 kg)    Fetal Status: Fetal Heart Rate (bpm): 145 Fundal Height: 36 cm Movement: Present     General:  Alert, oriented and cooperative. Patient is in no acute distress.  Skin: Skin is warm and dry. No rash noted.   Cardiovascular: Normal heart rate noted  Respiratory: Normal respiratory effort, no problems with respiration noted  Abdomen: Soft, gravid, appropriate for gestational age.  Pain/Pressure: Present     Pelvic: Cervical exam performed Dilation: Closed Effacement (%): 20 Station: -3  Extremities: Normal range of motion.  Edema: None  Mental Status: Normal mood and affect. Normal behavior. Normal judgment and thought content.   Assessment and Plan:  Pregnancy: G3P1011 at [redacted]w[redacted]d  1. Encounter for supervision of other normal pregnancy in third trimester UTD currently, reviewed vaginal bleeding which was after sex. None since 2+ days ago.  - Culture, beta strep (group b only) - GC/Chlamydia Probe Amp - POC Urinalysis Dipstick OB   Preterm labor symptoms and general obstetric precautions  including but not limited to vaginal bleeding, contractions, leaking of fluid and fetal movement were reviewed in detail with the patient. Please refer to After Visit Summary for other counseling recommendations.  Return in about 1 week (around 01/07/2018) for Routine prenatal care.  Future Appointments  Date Time Provider Department Center  01/07/2018 11:45 AM Cheral Marker, CNM [facility]    Federico Flake, MD

## 2018-01-02 LAB — GC/CHLAMYDIA PROBE AMP
Chlamydia trachomatis, NAA: NEGATIVE
Neisseria gonorrhoeae by PCR: NEGATIVE

## 2018-01-04 ENCOUNTER — Inpatient Hospital Stay (HOSPITAL_COMMUNITY)
Admission: AD | Admit: 2018-01-04 | Discharge: 2018-01-04 | Disposition: A | Discharge status: Home / Self Care | Payer: BLUE CROSS/BLUE SHIELD | Source: Ambulatory Visit | Attending: Obstetrics & Gynecology | Admitting: Obstetrics & Gynecology

## 2018-01-04 ENCOUNTER — Encounter (HOSPITAL_COMMUNITY): Payer: Self-pay | Admitting: *Deleted

## 2018-01-04 DIAGNOSIS — O4292 Full-term premature rupture of membranes, unspecified as to length of time between rupture and onset of labor: Secondary | ICD-10-CM | POA: Diagnosis not present

## 2018-01-04 DIAGNOSIS — Z79899 Other long term (current) drug therapy: Secondary | ICD-10-CM | POA: Insufficient documentation

## 2018-01-04 DIAGNOSIS — O99343 Other mental disorders complicating pregnancy, third trimester: Secondary | ICD-10-CM | POA: Insufficient documentation

## 2018-01-04 DIAGNOSIS — F419 Anxiety disorder, unspecified: Secondary | ICD-10-CM | POA: Insufficient documentation

## 2018-01-04 DIAGNOSIS — Z3A37 37 weeks gestation of pregnancy: Secondary | ICD-10-CM | POA: Insufficient documentation

## 2018-01-04 DIAGNOSIS — Z3483 Encounter for supervision of other normal pregnancy, third trimester: Secondary | ICD-10-CM

## 2018-01-04 DIAGNOSIS — O368330 Maternal care for abnormalities of the fetal heart rate or rhythm, third trimester, not applicable or unspecified: Secondary | ICD-10-CM | POA: Insufficient documentation

## 2018-01-04 DIAGNOSIS — Z833 Family history of diabetes mellitus: Secondary | ICD-10-CM | POA: Diagnosis not present

## 2018-01-04 DIAGNOSIS — Z87891 Personal history of nicotine dependence: Secondary | ICD-10-CM | POA: Insufficient documentation

## 2018-01-04 DIAGNOSIS — Z888 Allergy status to other drugs, medicaments and biological substances status: Secondary | ICD-10-CM | POA: Diagnosis not present

## 2018-01-04 DIAGNOSIS — O479 False labor, unspecified: Secondary | ICD-10-CM

## 2018-01-04 LAB — POCT FERN TEST: POCT Fern Test: NEGATIVE

## 2018-01-04 LAB — CULTURE, BETA STREP (GROUP B ONLY): Strep Gp B Culture: NEGATIVE

## 2018-01-04 LAB — AMNISURE RUPTURE OF MEMBRANE (ROM) NOT AT ARMC: Amnisure ROM: NEGATIVE

## 2018-01-04 NOTE — MAU Note (Signed)
Started leaking at 0630, clear fluid. No bleeding.  Little bit contractions.

## 2018-01-04 NOTE — Discharge Instructions (Signed)
Keep your appointments in the office. Return if your water breaks, if you have vaginal bleeding or if you are having labor contractions.

## 2018-01-04 NOTE — Telephone Encounter (Signed)
Pt currently at [facility] for assessment.

## 2018-01-04 NOTE — MAU Provider Note (Signed)
[id number]   Chief Complaint  Patient presents with  . Contractions  . Rupture of Membranes    HPI Samantha Sandoval is a 22 y.o. female  G3P1011 here with report of watery vaginal discharge that on awakening.  Leaking of fluid has not continued.  Pt denies contractions and vaginal bleeding.  +fetal movement.   All other systems negative.    Reports having the same symptoms with her last pregnancy and her water was broken for 16 hours before she realized it  Patient's last menstrual period was 04/19/2017 (approximate).  OB History  Gravida Para Term Preterm AB Living  3 1 1   1 1   SAB TAB Ectopic Multiple Live Births  1     0 1    # Outcome Date GA Lbr Len/2nd Weight Sex Delivery Anes PTL Lv  3 Current           2 SAB 02/26/17          1 Term 05/16/16 [redacted]w[redacted]d 18:10 / 00:28 3330 g M Vag-Spont EPI N LIV    Past Medical History:  Diagnosis Date  . Anxiety   . Vaginal Pap smear, abnormal     Family History  Problem Relation Age of Onset  . Diabetes Maternal Grandmother   . Hypertension Maternal Grandmother   . COPD Maternal Grandmother   . Scoliosis Maternal Grandmother   . Heart disease Maternal Grandmother   . Hypertension Maternal Grandfather   . Other Maternal Grandfather        2 stents in heart  . Heart attack Paternal Grandfather   . Congestive Heart Failure Neg Hx     Social History   Socioeconomic History  . Marital status: Married    Spouse name: Not on file  . Number of children: 1  . Years of education: Not on file  . Highest education level: Not on file  Occupational History  . Not on file  Social Needs  . Financial resource strain: Not on file  . Food insecurity:    Worry: Not on file    Inability: Not on file  . Transportation needs:    Medical: Not on file    Non-medical: Not on file  Tobacco Use  . Smoking status: Former Smoker    Packs/day: 0.25    Types: Cigarettes  . Smokeless tobacco: Never Used  Substance and Sexual Activity  .  Alcohol use: No    Comment: none  . Drug use: No    Comment: denies  . Sexual activity: Yes    Birth control/protection: None  Lifestyle  . Physical activity:    Days per week: Not on file    Minutes per session: Not on file  . Stress: Not on file  Relationships  . Social connections:    Talks on phone: Not on file    Gets together: Not on file    Attends religious service: Not on file    Active member of club or organization: Not on file    Attends meetings of clubs or organizations: Not on file    Relationship status: Not on file  Other Topics Concern  . Not on file  Social History Narrative  . Not on file    Allergies  Allergen Reactions  . Hibiclens [Chlorhexidine]     No current facility-administered medications on file prior to encounter.    Current Outpatient Medications on File Prior to Encounter  Medication Sig Dispense Refill  . Doxylamine-Pyridoxine (DICLEGIS) 10-10 MG  TBEC 2 tabs q hs, if sx persist add 1 tab q am on day 3, if sx persist add 1 tab q afternoon on day 4 100 tablet 6     Review of Systems  Constitutional: Negative for fever.  Gastrointestinal: Negative for abdominal pain.  Genitourinary: Negative for vaginal bleeding and vaginal discharge.       Reports having wet underwear upon awakening No continued leaking     Physical Exam   Vitals:   01/04/18 0733 01/04/18 0742  BP:  114/73  Pulse:  99  Resp:  18  Temp:  98.4 F (36.9 C)  TempSrc:  Oral  SpO2:  97%  Weight: 99.2 kg   Height: 5\' 3"  (1.6 m)     Physical Exam  Nursing note and vitals reviewed. Constitutional: She is oriented to person, place, and time. She appears well-developed and well-nourished.  HENT:  Head: Normocephalic.  Eyes: EOM are normal.  Neck: Neck supple.  GI: Soft. There is no tenderness. There is no rebound and no guarding.  FHT 140 with moderate variability and 15x15 accels noted.  No contractions.  No decelerations.  Reactive NST  Genitourinary:   Genitourinary Comments: Speculum exam: Vagina - Small amount of white discharge, no pooling, no leaking with valsalva Cervix - No contact bleeding Bimanual exam: Cervix closed, soft, baby is very high - can barely touch Amnisure done Chaperone present for exam.   Musculoskeletal: Normal range of motion.  Neurological: She is alert and oriented to person, place, and time.  Skin: Skin is warm and dry.  Psychiatric: She has a normal mood and affect.    MAU Course  Procedures Results for orders placed or performed during the hospital encounter of 01/04/18 (from the past 24 hour(s))  Amnisure rupture of membrane (rom)not at [facility]     Status: None   Collection Time: 01/04/18  8:32 AM  Result Value Ref Range   Amnisure ROM NEGATIVE     MDM Nurse did fern that was negative.   Assessment and Plan  SIUP at @[redacted]w[redacted]d @ Vaginal discharge, no evidence of ROM Not in labor  Reactive NST  Plan Discharge home Labor precautions Follow up in office or in MAU PRN    Currie Paris, NP 01/04/2018 8:37 AM

## 2018-01-07 ENCOUNTER — Ambulatory Visit (INDEPENDENT_AMBULATORY_CARE_PROVIDER_SITE_OTHER): Payer: BLUE CROSS/BLUE SHIELD | Admitting: Women's Health

## 2018-01-07 ENCOUNTER — Encounter: Payer: Self-pay | Admitting: Women's Health

## 2018-01-07 ENCOUNTER — Other Ambulatory Visit: Payer: Self-pay

## 2018-01-07 VITALS — BP 110/61 | HR 92 | Wt 217.0 lb

## 2018-01-07 DIAGNOSIS — Z3A37 37 weeks gestation of pregnancy: Secondary | ICD-10-CM

## 2018-01-07 DIAGNOSIS — Z3483 Encounter for supervision of other normal pregnancy, third trimester: Secondary | ICD-10-CM

## 2018-01-07 DIAGNOSIS — Z1389 Encounter for screening for other disorder: Secondary | ICD-10-CM

## 2018-01-07 DIAGNOSIS — Z113 Encounter for screening for infections with a predominantly sexual mode of transmission: Secondary | ICD-10-CM

## 2018-01-07 DIAGNOSIS — Z331 Pregnant state, incidental: Secondary | ICD-10-CM

## 2018-01-07 DIAGNOSIS — W460XXD Contact with hypodermic needle, subsequent encounter: Secondary | ICD-10-CM

## 2018-01-07 LAB — POCT URINALYSIS DIPSTICK OB
Blood, UA: NEGATIVE
Glucose, UA: NEGATIVE
Ketones, UA: NEGATIVE
Leukocytes, UA: NEGATIVE
Nitrite, UA: NEGATIVE
POC,PROTEIN,UA: NEGATIVE

## 2018-01-07 NOTE — Patient Instructions (Addendum)
Samantha Sandoval, I greatly value your feedback.  If you receive a survey following your visit with [place] today, we appreciate you taking the time to fill it out.  Thanks, [name], CNM, WHNP-BC   Call the office [phone]) or go to [facility] if:  You begin to have strong, frequent contractions  Your water breaks.  Sometimes it is a big gush of fluid, sometimes it is just a trickle that keeps getting your panties wet or running down your legs  You have vaginal bleeding.  It is normal to have a small amount of spotting if your cervix was checked.   You don't feel your baby moving like normal.  If you don't, get you something to eat and drink and lay down and focus on feeling your baby move.  You should feel at least 10 movements in 2 hours.  If you don't, you should call the office or go to [facility].    [facility] Contractions Contractions of the uterus can occur throughout pregnancy, but they are not always a sign that you are in labor. You may have practice contractions called Braxton Hicks contractions. These false labor contractions are sometimes confused with true labor. What are Deberah Pelton contractions? Braxton Hicks contractions are tightening movements that occur in the muscles of the uterus before labor. Unlike true labor contractions, these contractions do not result in opening (dilation) and thinning of the cervix. Toward the end of pregnancy (32-34 weeks), Braxton Hicks contractions can happen more often and may become stronger. These contractions are sometimes difficult to tell apart from true labor because they can be very uncomfortable. You should not feel embarrassed if you go to the hospital with false labor. Sometimes, the only way to tell if you are in true labor is for your health care provider to look for changes in the cervix. The health care provider will do a physical exam and may monitor your contractions. If you are not in true labor, the exam should show  that your cervix is not dilating and your water has not broken. If there are other health problems associated with your pregnancy, it is completely safe for you to be sent home with false labor. You may continue to have Braxton Hicks contractions until you go into true labor. How to tell the difference between true labor and false labor True labor  Contractions last 30-70 seconds.  Contractions become very regular.  Discomfort is usually felt in the top of the uterus, and it spreads to the lower abdomen and low back.  Contractions do not go away with walking.  Contractions usually become more intense and increase in frequency.  The cervix dilates and gets thinner. False labor  Contractions are usually shorter and not as strong as true labor contractions.  Contractions are usually irregular.  Contractions are often felt in the front of the lower abdomen and in the groin.  Contractions may go away when you walk around or change positions while lying down.  Contractions get weaker and are shorter-lasting as time goes on.  The cervix usually does not dilate or become thin. Follow these instructions at home:  Take over-the-counter and prescription medicines only as told by your health care provider.  Keep up with your usual exercises and follow other instructions from your health care provider.  Eat and drink lightly if you think you are going into labor.  If Braxton Hicks contractions are making you uncomfortable: ? Change your position from lying down or  or resting to walking, or change from walking to resting. ? Sit and rest in a tub of warm water. ? Drink enough fluid to keep your urine pale yellow. Dehydration may cause these contractions. ? Do slow and deep breathing several times an hour.  Keep all follow-up prenatal visits as told by your health care provider. This is important. Contact a health care provider if:  You have a fever.  You have continuous pain in your  abdomen. Get help right away if:  Your contractions become stronger, more regular, and closer together.  You have fluid leaking or gushing from your vagina.  You pass blood-tinged mucus (bloody show).  You have bleeding from your vagina.  You have low back pain that you never had before.  You feel your baby's head pushing down and causing pelvic pressure.  Your baby is not moving inside you as much as it used to. Summary  Contractions that occur before labor are called Braxton Hicks contractions, false labor, or practice contractions.  Braxton Hicks contractions are usually shorter, weaker, farther apart, and less regular than true labor contractions. True labor contractions usually become progressively stronger and regular and they become more frequent.  Manage discomfort from Braxton Hicks contractions by changing position, resting in a warm bath, drinking plenty of water, or practicing deep breathing. This information is not intended to replace advice given to you by your health care provider. Make sure you discuss any questions you have with your health care provider. Document Released: 07/06/2016 Document Revised: 07/06/2016 Document Reviewed: 07/06/2016 Elsevier Interactive Patient Education  2018 Elsevier Inc.  

## 2018-01-07 NOTE — Progress Notes (Signed)
   LOW-RISK PREGNANCY VISIT Patient name: Samantha Sandoval MRN [id number]  Date of birth: Aug 01, 1995 Chief Complaint:   Routine Prenatal Visit  History of Present Illness:   Samantha Sandoval is a 22 y.o. G28P1011 female at [redacted]w[redacted]d with an Estimated Date of Delivery: 01/24/18 being seen today for ongoing management of a low-risk pregnancy.  Today she reports no complaints. Contractions: Not present. Vag. Bleeding: None.  Movement: Present. denies leaking of fluid. Review of Systems:   Pertinent items are noted in HPI Denies abnormal vaginal discharge w/ itching/odor/irritation, headaches, visual changes, shortness of breath, chest pain, abdominal pain, severe nausea/vomiting, or problems with urination or bowel movements unless otherwise stated above. Pertinent History Reviewed:  Reviewed past medical,surgical, social, obstetrical and family history.  Reviewed problem list, medications and allergies. Physical Assessment:   Vitals:   01/07/18 1156  BP: 110/61  Pulse: 92  Weight: 217 lb (98.4 kg)  Body mass index is 38.44 kg/m.        Physical Examination:   General appearance: Well appearing, and in no distress  Mental status: Alert, oriented to person, place, and time  Skin: Warm & dry  Cardiovascular: Normal heart rate noted  Respiratory: Normal respiratory effort, no distress  Abdomen: Soft, gravid, nontender  Pelvic: Cervical exam performed  Dilation: Closed Effacement (%): Thick Station: -3  Extremities: Edema: None  Fetal Status: Fetal Heart Rate (bpm): 150 Fundal Height: 36 cm Movement: Present Presentation: Vertex  Results for orders placed or performed in visit on 01/07/18 (from the past 24 hour(s))  POC Urinalysis Dipstick OB   Collection Time: 01/07/18 11:56 AM  Result Value Ref Range   Color, UA     Clarity, UA     Glucose, UA Negative Negative   Bilirubin, UA     Ketones, UA neg    Spec Grav, UA     Blood, UA neg    pH, UA     POC Protein UA Negative Negative, Trace   Urobilinogen, UA     Nitrite, UA neg    Leukocytes, UA Negative Negative   Appearance     Odor      Assessment & Plan:  1) Low-risk pregnancy G3P1011 at [redacted]w[redacted]d with an Estimated Date of Delivery: 01/24/18   2) Needlestick injury in July, repeat hep b & hep c today   Meds: No orders of the defined types were placed in this encounter.  Labs/procedures today: hepb &c  Plan:  Continue routine obstetrical care   Reviewed: Term labor symptoms and general obstetric precautions including but not limited to vaginal bleeding, contractions, leaking of fluid and fetal movement were reviewed in detail with the patient.  All questions were answered  Follow-up: Return in about 1 week (around 01/14/2018) for LROB.  Orders Placed This Encounter  Procedures  . Hepatitis B surface antigen  . Hepatitis C antibody  . POC Urinalysis Dipstick OB   Cheral Marker CNM, [facility] 01/07/2018 1:21 PM

## 2018-01-08 LAB — HEPATITIS C ANTIBODY: Hep C Virus Ab: <0.1 s/co ratio (ref 0.0–0.9)

## 2018-01-08 LAB — HEPATITIS B SURFACE ANTIGEN: Hepatitis B Surface Ag: NEGATIVE

## 2018-01-14 ENCOUNTER — Ambulatory Visit (INDEPENDENT_AMBULATORY_CARE_PROVIDER_SITE_OTHER): Payer: BLUE CROSS/BLUE SHIELD | Admitting: Family Medicine

## 2018-01-14 ENCOUNTER — Encounter: Payer: Self-pay | Admitting: Family Medicine

## 2018-01-14 VITALS — BP 115/76 | HR 80 | Wt 216.2 lb

## 2018-01-14 DIAGNOSIS — Z3A38 38 weeks gestation of pregnancy: Secondary | ICD-10-CM

## 2018-01-14 DIAGNOSIS — Z331 Pregnant state, incidental: Secondary | ICD-10-CM

## 2018-01-14 DIAGNOSIS — Z1389 Encounter for screening for other disorder: Secondary | ICD-10-CM

## 2018-01-14 DIAGNOSIS — Z3483 Encounter for supervision of other normal pregnancy, third trimester: Secondary | ICD-10-CM

## 2018-01-14 LAB — POCT URINALYSIS DIPSTICK OB
Blood, UA: NEGATIVE
Glucose, UA: NEGATIVE
Ketones, UA: NEGATIVE
Leukocytes, UA: NEGATIVE
Nitrite, UA: NEGATIVE
POC,PROTEIN,UA: NEGATIVE

## 2018-01-14 NOTE — Progress Notes (Signed)
    PRENATAL VISIT NOTE  Subjective:  Samantha Sandoval is a 22 y.o. G3P1011 at [redacted]w[redacted]d being seen today for ongoing prenatal care.  She is currently monitored for the following issues for this low-risk pregnancy and has Family history of ovarian cancer; Low grade squamous intraepithelial lesion (LGSIL) on Papanicolaou smear of cervix; Supervision of normal pregnancy; and H/O laser treatment of cervix complicating pregnancy on their problem list.  Patient reports mild cramping this weekend.  Contractions: Irritability. Vag. Bleeding: None.  Movement: Present. Denies leaking of fluid.   The following portions of the patient's history were reviewed and updated as appropriate: allergies, current medications, past family history, past medical history, past social history, past surgical history and problem list. Problem list updated.  Objective:   Vitals:   01/14/18 0906  BP: 115/76  Pulse: 80  Weight: 216 lb 3.2 oz (98.1 kg)    Fetal Status: Fetal Heart Rate (bpm): 145 Fundal Height: 39 cm Movement: Present  Presentation: Vertex  General:  Alert, oriented and cooperative. Patient is in no acute distress.  Skin: Skin is warm and dry. No rash noted.   Cardiovascular: Normal heart rate noted  Respiratory: Normal respiratory effort, no problems with respiration noted  Abdomen: Soft, gravid, appropriate for gestational age.  Pain/Pressure: Present     Pelvic: Cervical exam performed Dilation: 1 Effacement (%): 20 Station: -3  Extremities: Normal range of motion.  Edema: None  Mental Status: Normal mood and affect. Normal behavior. Normal judgment and thought content.   Assessment and Plan:  Pregnancy: G3P1011 at [redacted]w[redacted]d  1.  Screening for genitourinary condition - POC Urinalysis Dipstick OB  3. Encounter for supervision of other normal pregnancy in third trimester Up to date and knows when to go to LD Reviewed sweeping membranes next week if desired    Term labor symptoms and general  obstetric precautions including but not limited to vaginal bleeding, contractions, leaking of fluid and fetal movement were reviewed in detail with the patient. Please refer to After Visit Summary for other counseling recommendations.  Return in about 1 week (around 01/21/2018) for Routine prenatal care.  Future Appointments  Date Time Provider Department Center  01/21/2018 10:45 AM Cheral Marker, CNM [facility]    Federico Flake, MD

## 2018-01-16 ENCOUNTER — Encounter (HOSPITAL_COMMUNITY): Payer: Self-pay | Admitting: *Deleted

## 2018-01-16 ENCOUNTER — Inpatient Hospital Stay (HOSPITAL_COMMUNITY)
Admission: AD | Admit: 2018-01-16 | Discharge: 2018-01-16 | Disposition: A | Discharge status: Home / Self Care | Payer: BLUE CROSS/BLUE SHIELD | Source: Ambulatory Visit | Attending: Family Medicine | Admitting: Family Medicine

## 2018-01-16 DIAGNOSIS — O479 False labor, unspecified: Secondary | ICD-10-CM

## 2018-01-16 DIAGNOSIS — O471 False labor at or after 37 completed weeks of gestation: Secondary | ICD-10-CM | POA: Diagnosis not present

## 2018-01-16 DIAGNOSIS — Z3A39 39 weeks gestation of pregnancy: Secondary | ICD-10-CM | POA: Diagnosis not present

## 2018-01-16 NOTE — Discharge Instructions (Signed)
Braxton Hicks Contractions °Contractions of the uterus can occur throughout pregnancy, but they are not always a sign that you are in labor. You may have practice contractions called Braxton Hicks contractions. These false labor contractions are sometimes confused with true labor. °What are Braxton Hicks contractions? °Braxton Hicks contractions are tightening movements that occur in the muscles of the uterus before labor. Unlike true labor contractions, these contractions do not result in opening (dilation) and thinning of the cervix. Toward the end of pregnancy (32-34 weeks), Braxton Hicks contractions can happen more often and may become stronger. These contractions are sometimes difficult to tell apart from true labor because they can be very uncomfortable. You should not feel embarrassed if you go to the hospital with false labor. °Sometimes, the only way to tell if you are in true labor is for your health care provider to look for changes in the cervix. The health care provider will do a physical exam and may monitor your contractions. If you are not in true labor, the exam should show that your cervix is not dilating and your water has not broken. °If there are other health problems associated with your pregnancy, it is completely safe for you to be sent home with false labor. You may continue to have Braxton Hicks contractions until you go into true labor. °How to tell the difference between true labor and false labor °True labor °· Contractions last 30-70 seconds. °· Contractions become very regular. °· Discomfort is usually felt in the top of the uterus, and it spreads to the lower abdomen and low back. °· Contractions do not go away with walking. °· Contractions usually become more intense and increase in frequency. °· The cervix dilates and gets thinner. °False labor °· Contractions are usually shorter and not as strong as true labor contractions. °· Contractions are usually irregular. °· Contractions  are often felt in the front of the lower abdomen and in the groin. °· Contractions may go away when you walk around or change positions while lying down. °· Contractions get weaker and are shorter-lasting as time goes on. °· The cervix usually does not dilate or become thin. °Follow these instructions at home: °· Take over-the-counter and prescription medicines only as told by your health care provider. °· Keep up with your usual exercises and follow other instructions from your health care provider. °· Eat and drink lightly if you think you are going into labor. °· If Braxton Hicks contractions are making you uncomfortable: °? Change your position from lying down or resting to walking, or change from walking to resting. °? Sit and rest in a tub of warm water. °? Drink enough fluid to keep your urine pale yellow. Dehydration may cause these contractions. °? Do slow and deep breathing several times an hour. °· Keep all follow-up prenatal visits as told by your health care provider. This is important. °Contact a health care provider if: °· You have a fever. °· You have continuous pain in your abdomen. °Get help right away if: °· Your contractions become stronger, more regular, and closer together. °· You have fluid leaking or gushing from your vagina. °· You pass blood-tinged mucus (bloody show). °· You have bleeding from your vagina. °· You have low back pain that you never had before. °· You feel your baby’s head pushing down and causing pelvic pressure. °· Your baby is not moving inside you as much as it used to. °Summary °· Contractions that occur before labor are called Braxton   Hicks contractions, false labor, or practice contractions. °· Braxton Hicks contractions are usually shorter, weaker, farther apart, and less regular than true labor contractions. True labor contractions usually become progressively stronger and regular and they become more frequent. °· Manage discomfort from Braxton Hicks contractions by  changing position, resting in a warm bath, drinking plenty of water, or practicing deep breathing. °This information is not intended to replace advice given to you by your health care provider. Make sure you discuss any questions you have with your health care provider. °Document Released: 07/06/2016 Document Revised: 07/06/2016 Document Reviewed: 07/06/2016 °Elsevier Interactive Patient Education © 2018 Elsevier Inc. ° °

## 2018-01-16 NOTE — MAU Note (Signed)
PT SAYS UC HURT BAD  SIINCE 9 PM. PNC- WITH [facility]- VE 1 CM - ON MONDAY  DENIES HSV AND MRSA. GBS- NEG

## 2018-01-18 ENCOUNTER — Encounter (HOSPITAL_COMMUNITY): Payer: Self-pay

## 2018-01-18 ENCOUNTER — Inpatient Hospital Stay (HOSPITAL_COMMUNITY)
Admission: AD | Admit: 2018-01-18 | Discharge: 2018-01-18 | Disposition: A | Discharge status: Home / Self Care | Payer: BLUE CROSS/BLUE SHIELD | Source: Ambulatory Visit | Attending: Obstetrics & Gynecology | Admitting: Obstetrics & Gynecology

## 2018-01-18 DIAGNOSIS — Z0371 Encounter for suspected problem with amniotic cavity and membrane ruled out: Secondary | ICD-10-CM

## 2018-01-18 DIAGNOSIS — O26893 Other specified pregnancy related conditions, third trimester: Secondary | ICD-10-CM | POA: Diagnosis not present

## 2018-01-18 DIAGNOSIS — Z3A39 39 weeks gestation of pregnancy: Secondary | ICD-10-CM | POA: Diagnosis not present

## 2018-01-18 DIAGNOSIS — O418X30 Other specified disorders of amniotic fluid and membranes, third trimester, not applicable or unspecified: Secondary | ICD-10-CM | POA: Insufficient documentation

## 2018-01-18 DIAGNOSIS — Z3483 Encounter for supervision of other normal pregnancy, third trimester: Secondary | ICD-10-CM

## 2018-01-18 LAB — AMNISURE RUPTURE OF MEMBRANE (ROM) NOT AT ARMC: Amnisure ROM: NEGATIVE

## 2018-01-18 LAB — POCT FERN TEST: POCT Fern Test: NEGATIVE

## 2018-01-18 NOTE — Discharge Instructions (Signed)

## 2018-01-18 NOTE — MAU Provider Note (Signed)
First Provider Initiated Contact with Patient 01/18/18 1706       S: Ms. Samantha PonsRachel Sandoval is a 22 y.o. G3P1011 at 6866w1d  who presents to MAU today complaining of leaking of fluid since 1300 on 11/15. Denies big gush of fluid, reports she has had constant small amounts of leakage and dampness all day. She denies vaginal bleeding. She endorses contractions. Has been having contractions since 11/13. Reports they have not become more severe or frequent. She reports normal fetal movement. Reports that with her previous child she was ruptured for 19 hours and didn't know because it was a continuous slow leakage of fluid.     O: BP 111/63   Pulse (!) 117   Temp 99.3 F (37.4 C) (Oral)   Resp 16   Wt 98.9 kg   LMP 04/19/2017 (Approximate)   BMI 38.62 kg/m  GENERAL: Well-developed, well-nourished female in no acute distress.  HEAD: Normocephalic, atraumatic.  CHEST: Normal effort of breathing, regular heart rate ABDOMEN: Soft, nontender, gravid PELVIC: Normal external female genitalia. Vagina is pink and rugated. Cervix with normal contour, no lesions. Large amount of discharge present.  Unable to appreciate pooling.   Cervical exam:  Dilation: 1.5 Effacement (%): 70 Cervical Position: Middle, Posterior Station: -3 Presentation: Vertex Exam by:: Dr. Roxanna MewK. Amaal Dimartino   Fetal Monitoring: Baseline: 150 Variability: moderate Accelerations: + Decelerations: no Contractions: Occasional with irritability   Results for orders placed or performed during the hospital encounter of 01/18/18 (from the past 24 hour(s))  POCT fern test     Status: None   Collection Time: 01/18/18  4:55 PM  Result Value Ref Range   POCT Fern Test Negative = intact amniotic membranes   [facility]     Status: None   Collection Time: 01/18/18  5:20 PM  Result Value Ref Range   Amnisure ROM NEGATIVE      A: SIUP at 9566w1d  Membranes intact  Cervix remains essentially unchanged from  last exam of 1cm/70 effacement.   P: Term labor precautions reviewed.   Arvilla MarketWallace, Tanza Pellot Lauren, DO 01/18/2018 5:48 PM

## 2018-01-18 NOTE — MAU Note (Signed)
Pt having irr ctx and felt leaking fluid when she woke up at 1pm today. Has continued leaking.

## 2018-01-18 NOTE — MAU Note (Signed)
Urine in lab 

## 2018-01-21 ENCOUNTER — Ambulatory Visit (INDEPENDENT_AMBULATORY_CARE_PROVIDER_SITE_OTHER): Payer: BLUE CROSS/BLUE SHIELD | Admitting: Women's Health

## 2018-01-21 ENCOUNTER — Encounter: Payer: Self-pay | Admitting: Women's Health

## 2018-01-21 ENCOUNTER — Other Ambulatory Visit: Payer: Self-pay

## 2018-01-21 VITALS — BP 112/60 | HR 89 | Wt 219.0 lb

## 2018-01-21 DIAGNOSIS — Z3A39 39 weeks gestation of pregnancy: Secondary | ICD-10-CM

## 2018-01-21 DIAGNOSIS — Z3483 Encounter for supervision of other normal pregnancy, third trimester: Secondary | ICD-10-CM

## 2018-01-21 DIAGNOSIS — Z1389 Encounter for screening for other disorder: Secondary | ICD-10-CM

## 2018-01-21 DIAGNOSIS — Z331 Pregnant state, incidental: Secondary | ICD-10-CM

## 2018-01-21 LAB — POCT URINALYSIS DIPSTICK OB
Blood, UA: NEGATIVE
Glucose, UA: NEGATIVE
Ketones, UA: NEGATIVE
Leukocytes, UA: NEGATIVE
Nitrite, UA: NEGATIVE
POC,PROTEIN,UA: NEGATIVE

## 2018-01-21 NOTE — Progress Notes (Signed)
   LOW-RISK PREGNANCY VISIT Patient name: Samantha Sandoval MRN [id number]  Date of birth: 09/15/1995 Chief Complaint:   Routine Prenatal Visit  History of Present Illness:   Samantha Sandoval is a 22 y.o. 433P1011 female at 1326w4d with an Estimated Date of Delivery: 01/24/18 being seen today for ongoing management of a low-risk pregnancy.  Today she reports some cramping. Grandma, main support person, is having eye surgery Thurs and would not be able to be there for pt, pt wants to be induced tonight or tomorrow if possible d/t this reason. Contractions: Irregular. Vag. Bleeding: None.  Movement: Present. denies leaking of fluid. Review of Systems:   Pertinent items are noted in HPI Denies abnormal vaginal discharge w/ itching/odor/irritation, headaches, visual changes, shortness of breath, chest pain, abdominal pain, severe nausea/vomiting, or problems with urination or bowel movements unless otherwise stated above. Pertinent History Reviewed:  Reviewed past medical,surgical, social, obstetrical and family history.  Reviewed problem list, medications and allergies. Physical Assessment:   Vitals:   01/21/18 1058  BP: 112/60  Pulse: 89  Weight: 219 lb (99.3 kg)  Body mass index is 38.79 kg/m.        Physical Examination:   General appearance: Well appearing, and in no distress  Mental status: Alert, oriented to person, place, and time  Skin: Warm & dry  Cardiovascular: Normal heart rate noted  Respiratory: Normal respiratory effort, no distress  Abdomen: Soft, gravid, nontender  Pelvic: Cervical exam performed  Dilation: 2.5 Effacement (%): 80 Station: [employer] Offered membrane sweeping, discussed r/b- pt decided to proceed, so membranes swept.   Extremities: Edema: None  Fetal Status: Fetal Heart Rate (bpm): 141 Fundal Height: 38 cm Movement: Present Presentation: Vertex  Results for orders placed or performed in visit on 01/21/18 (from the past 24 hour(s))  POC Urinalysis Dipstick OB   Collection Time: 01/21/18 10:58 AM  Result Value Ref Range   Color, UA     Clarity, UA     Glucose, UA Negative Negative   Bilirubin, UA     Ketones, UA neg    Spec Grav, UA     Blood, UA neg    pH, UA     POC,PROTEIN,UA Negative Negative, Trace, Small (1+), Moderate (2+), Large (3+), 4+   Urobilinogen, UA     Nitrite, UA neg    Leukocytes, UA Negative Negative   Appearance     Odor      Assessment & Plan:  1) Low-risk pregnancy G3P1011 at 2226w4d with an Estimated Date of Delivery: 01/24/18    Meds: No orders of the defined types were placed in this encounter.  Labs/procedures today: sve, membrane sweping  Plan:  IOL scheduled for 11/20 @ [facility] for elective reasons (main support person won't be available after Thurs 11/21).  IOL form faxed via Epic and orders placed   Reviewed: Term labor symptoms and general obstetric precautions including but not limited to vaginal bleeding, contractions, leaking of fluid and fetal movement were reviewed in detail with the patient.  All questions were answered  Follow-up: Return for 4-6wks for pp visit.  Orders Placed This Encounter  Procedures  . POC Urinalysis Dipstick OB   Cheral MarkerKimberly R Jasia Hiltunen CNM, [facility] 01/21/2018 11:49 AM

## 2018-01-21 NOTE — Treatment Plan (Signed)
Induction Assessment Scheduling Form [facility]ax to [facility]:  [phone]  Samantha PonsRachel Sandoval                                                                                   DOB:  10/29/1995                                                           MRN:  [id number]                                                                     Phone #:   [phone]                         Provider:  [facility]  GP:  [id number]                                                            Estimated Date of Delivery: 01/24/18  Dating Criteria: LMP c/w 6wk u/s    Medical Indications for induction:  Elective/support person not available after 11/21 Admission Date/Time:  11/20 @ [facility] Gestational age on admission:  39.6   Filed Weights   01/21/18 1058  Weight: 219 lb (99.3 kg)   HIV:  Non Reactive (09/09 0814) GBS: Negative (10/28 0000)  2.5/80/ballotalble, vtx   Method of induction(proposed):  pitocin   Scheduling Provider Signature:  Cheral MarkerKimberly R Kiara Sandoval, CNM                                            Today's Date:  01/21/2018

## 2018-01-21 NOTE — Patient Instructions (Addendum)
Shawnie Ponsachel Meuth, I greatly value your feedback.  If you receive a survey following your visit with us today, we appreciate you taking the time to fill it out.  Thanks, Joellyn HaffKim Neda Willenbring, CNM, WHNP-BC  Your induction is scheduled for tomorrow night @ 11:45pm. [facility] entrance and let them know you are there to be induced. They will send someone from Labor & Delivery to come get you.     Call the office [phone]) or go to [facility] if:  You begin to have strong, frequent contractions  Your water breaks.  Sometimes it is a big gush of fluid, sometimes it is just a trickle that keeps getting your panties wet or running down your legs  You have vaginal bleeding.  It is normal to have a small amount of spotting if your cervix was checked.   You don't feel your baby moving like normal.  If you don't, get you something to eat and drink and lay down and focus on feeling your baby move.  You should feel at least 10 movements in 2 hours.  If you don't, you should call the office or go to [facility].     [facility] Series[facility]xton Hicks Contractions Contractions of the uterus can occur throughout pregnancy, but they are not always a sign that you are in labor. You may have practice contractions called Braxton Hicks contractions. These false labor contractions are sometimes confused with true labor. What are Deberah PeltonBraxton Hicks contractions? Braxton Hicks contractions are tightening movements that occur in the muscles of the uterus before labor. Unlike true labor contractions, these contractions do not result in opening (dilation) and thinning of the cervix. Toward the end of pregnancy (32-34 weeks), Braxton Hicks contractions can happen more often and may become stronger. These contractions are sometimes difficult to tell apart from true labor because they can be very uncomfortable. You should not feel embarrassed if you go to the hospital with false labor. Sometimes, the  only way to tell if you are in true labor is for your health care provider to look for changes in the cervix. The health care provider will do a physical exam and may monitor your contractions. If you are not in true labor, the exam should show that your cervix is not dilating and your water has not broken. If there are other health problems associated with your pregnancy, it is completely safe for you to be sent home with false labor. You may continue to have Braxton Hicks contractions until you go into true labor. How to tell the difference between true labor and false labor True labor  Contractions last 30-70 seconds.  Contractions become very regular.  Discomfort is usually felt in the top of the uterus, and it spreads to the lower abdomen and low back.  Contractions do not go away with walking.  Contractions usually become more intense and increase in frequency.  The cervix dilates and gets thinner. False labor  Contractions are usually shorter and not as strong as true labor contractions.  Contractions are usually irregular.  Contractions are often felt in the front of the lower abdomen and in the groin.  Contractions may go away when you walk around or change positions while lying down.  Contractions get weaker and are shorter-lasting as time goes on.  The cervix usually does not dilate or become thin. Follow these instructions at home:  Take over-the-counter and prescription medicines only as told by your health care provider.  Keep  up with your usual exercises and follow other instructions from your health care provider.  Eat and drink lightly if you think you are going into labor.  If Braxton Hicks contractions are making you uncomfortable: ? Change your position from lying down or resting to walking, or change from walking to resting. ? Sit and rest in a tub of warm water. ? Drink enough fluid to keep your urine pale yellow. Dehydration may cause these  contractions. ? Do slow and deep breathing several times an hour.  Keep all follow-up prenatal visits as told by your health care provider. This is important. Contact a health care provider if:  You have a fever.  You have continuous pain in your abdomen. Get help right away if:  Your contractions become stronger, more regular, and closer together.  You have fluid leaking or gushing from your vagina.  You pass blood-tinged mucus (bloody show).  You have bleeding from your vagina.  You have low back pain that you never had before.  You feel your baby's head pushing down and causing pelvic pressure.  Your baby is not moving inside you as much as it used to. Summary  Contractions that occur before labor are called Braxton Hicks contractions, false labor, or practice contractions.  Braxton Hicks contractions are usually shorter, weaker, farther apart, and less regular than true labor contractions. True labor contractions usually become progressively stronger and regular and they become more frequent.  Manage discomfort from [facility] contractions by changing position, resting in a warm bath, drinking plenty of water, or practicing deep breathing. This information is not intended to replace advice given to you by your health care provider. Make sure you discuss any questions you have with your health care provider. Document Released: 07/06/2016 Document Revised: 07/06/2016 Document Reviewed: 07/06/2016 Elsevier Interactive Patient Education  2018 [employer].

## 2018-01-22 ENCOUNTER — Inpatient Hospital Stay (HOSPITAL_COMMUNITY): Payer: BLUE CROSS/BLUE SHIELD | Admitting: Anesthesiology

## 2018-01-22 ENCOUNTER — Inpatient Hospital Stay (HOSPITAL_COMMUNITY)
Admission: AD | Admit: 2018-01-22 | Discharge: 2018-01-24 | DRG: 807 | Disposition: A | Discharge status: Home / Self Care | Payer: BLUE CROSS/BLUE SHIELD | Attending: Family Medicine | Admitting: Family Medicine

## 2018-01-22 ENCOUNTER — Other Ambulatory Visit: Payer: Self-pay

## 2018-01-22 ENCOUNTER — Encounter (HOSPITAL_COMMUNITY): Payer: Self-pay | Admitting: *Deleted

## 2018-01-22 ENCOUNTER — Telehealth (HOSPITAL_COMMUNITY): Payer: Self-pay | Admitting: *Deleted

## 2018-01-22 DIAGNOSIS — Z3490 Encounter for supervision of normal pregnancy, unspecified, unspecified trimester: Secondary | ICD-10-CM | POA: Diagnosis present

## 2018-01-22 DIAGNOSIS — Z3A39 39 weeks gestation of pregnancy: Secondary | ICD-10-CM | POA: Diagnosis not present

## 2018-01-22 DIAGNOSIS — Z3483 Encounter for supervision of other normal pregnancy, third trimester: Secondary | ICD-10-CM | POA: Diagnosis present

## 2018-01-22 DIAGNOSIS — Z87891 Personal history of nicotine dependence: Secondary | ICD-10-CM | POA: Diagnosis not present

## 2018-01-22 LAB — CBC
HCT: 38.7 % (ref 36.0–46.0)
Hemoglobin: 12 g/dL (ref 12.0–15.0)
MCH: 24.8 pg — ABNORMAL LOW (ref 26.0–34.0)
MCHC: 31 g/dL (ref 30.0–36.0)
MCV: 80 fL (ref 80.0–100.0)
Platelets: 213 10*3/uL (ref 150–400)
RBC: 4.84 MIL/uL (ref 3.87–5.11)
RDW: 15.1 % (ref 11.5–15.5)
WBC: 13.4 10*3/uL — ABNORMAL HIGH (ref 4.0–10.5)
nRBC: 0 % (ref 0.0–0.2)

## 2018-01-22 LAB — URINALYSIS, ROUTINE W REFLEX MICROSCOPIC
Bilirubin Urine: NEGATIVE
Glucose, UA: NEGATIVE mg/dL
Ketones, ur: NEGATIVE mg/dL
Nitrite: NEGATIVE
Protein, ur: NEGATIVE mg/dL
Specific Gravity, Urine: 1.015 (ref 1.005–1.030)
pH: 6 (ref 5.0–8.0)

## 2018-01-22 LAB — TYPE AND SCREEN
ABO/RH(D): O POS
Antibody Screen: NEGATIVE

## 2018-01-22 MED ORDER — DIPHENHYDRAMINE HCL 25 MG PO CAPS: 25.0000 mg | ORAL_CAPSULE | Freq: Four times a day (QID) | ORAL | Status: DC | PRN

## 2018-01-22 MED ORDER — IBUPROFEN 600 MG PO TABS
600.0000 mg | ORAL_TABLET | Freq: Four times a day (QID) | ORAL | Status: DC
  Administered 2018-01-23 – 2018-01-24 (×6): 600 mg via ORAL
  Filled 2018-01-22 (×6): qty 1

## 2018-01-22 MED ORDER — SODIUM CHLORIDE 0.9% FLUSH: 3.0000 mL | Freq: Two times a day (BID) | INTRAVENOUS | Status: DC

## 2018-01-22 MED ORDER — EPHEDRINE 5 MG/ML INJ: 10.0000 mg | INTRAVENOUS | Status: DC | PRN

## 2018-01-22 MED ORDER — HYDROXYZINE HCL 50 MG PO TABS: 50.0000 mg | ORAL_TABLET | Freq: Four times a day (QID) | ORAL | Status: DC | PRN

## 2018-01-22 MED ORDER — BENZOCAINE-MENTHOL 20-0.5 % EX AERO
1.0000 "application " | INHALATION_SPRAY | CUTANEOUS | Status: DC | PRN
  Filled 2018-01-22 (×2): qty 56

## 2018-01-22 MED ORDER — OXYTOCIN BOLUS FROM INFUSION
500.0000 mL | Freq: Once | INTRAVENOUS | Status: AC
  Administered 2018-01-22: 500 mL via INTRAVENOUS

## 2018-01-22 MED ORDER — FLEET ENEMA 7-19 GM/118ML RE ENEM: 1.0000 | ENEMA | RECTAL | Status: DC | PRN

## 2018-01-22 MED ORDER — COCONUT OIL OIL
1.0000 "application " | TOPICAL_OIL | Status: DC | PRN
  Filled 2018-01-22: qty 120

## 2018-01-22 MED ORDER — ACETAMINOPHEN 325 MG PO TABS: 650.0000 mg | ORAL_TABLET | ORAL | Status: DC | PRN

## 2018-01-22 MED ORDER — OXYCODONE-ACETAMINOPHEN 5-325 MG PO TABS: 2.0000 | ORAL_TABLET | ORAL | Status: DC | PRN

## 2018-01-22 MED ORDER — SODIUM CHLORIDE 0.9% FLUSH: 3.0000 mL | INTRAVENOUS | Status: DC | PRN

## 2018-01-22 MED ORDER — ZOLPIDEM TARTRATE 5 MG PO TABS: 5.0000 mg | ORAL_TABLET | Freq: Every evening | ORAL | Status: DC | PRN

## 2018-01-22 MED ORDER — LACTATED RINGERS IV SOLN
500.0000 mL | INTRAVENOUS | Status: DC | PRN
  Administered 2018-01-22: 500 mL via INTRAVENOUS

## 2018-01-22 MED ORDER — LIDOCAINE-EPINEPHRINE (PF) 2 %-1:200000 IJ SOLN
INTRAMUSCULAR | Status: DC | PRN
  Administered 2018-01-22: 3 mL via EPIDURAL
  Administered 2018-01-22: 4 mL via EPIDURAL

## 2018-01-22 MED ORDER — SODIUM CHLORIDE 0.9 % IV SOLN: 250.0000 mL | INTRAVENOUS | Status: DC | PRN

## 2018-01-22 MED ORDER — OXYTOCIN 40 UNITS IN LACTATED RINGERS INFUSION - SIMPLE MED
2.5000 [IU]/h | INTRAVENOUS | Status: DC
  Administered 2018-01-22: 2.5 [IU]/h via INTRAVENOUS

## 2018-01-22 MED ORDER — PRENATAL MULTIVITAMIN CH
1.0000 | ORAL_TABLET | Freq: Every day | ORAL | Status: DC
  Administered 2018-01-23: 1 via ORAL
  Filled 2018-01-22: qty 1

## 2018-01-22 MED ORDER — FLEET ENEMA 7-19 GM/118ML RE ENEM: 1.0000 | ENEMA | Freq: Every day | RECTAL | Status: DC | PRN

## 2018-01-22 MED ORDER — ONDANSETRON HCL 4 MG PO TABS: 4.0000 mg | ORAL_TABLET | ORAL | Status: DC | PRN

## 2018-01-22 MED ORDER — PHENYLEPHRINE 40 MCG/ML (10ML) SYRINGE FOR IV PUSH (FOR BLOOD PRESSURE SUPPORT)
80.0000 ug | PREFILLED_SYRINGE | INTRAVENOUS | Status: DC | PRN
  Filled 2018-01-22: qty 10

## 2018-01-22 MED ORDER — PHENYLEPHRINE 40 MCG/ML (10ML) SYRINGE FOR IV PUSH (FOR BLOOD PRESSURE SUPPORT): 80.0000 ug | PREFILLED_SYRINGE | INTRAVENOUS | Status: DC | PRN

## 2018-01-22 MED ORDER — OXYTOCIN 40 UNITS IN LACTATED RINGERS INFUSION - SIMPLE MED
1.0000 m[IU]/min | INTRAVENOUS | Status: DC
  Administered 2018-01-22: 2 m[IU]/min via INTRAVENOUS
  Filled 2018-01-22: qty 1000

## 2018-01-22 MED ORDER — TETANUS-DIPHTH-ACELL PERTUSSIS 5-2.5-18.5 LF-MCG/0.5 IM SUSP
0.5000 mL | Freq: Once | INTRAMUSCULAR | Status: DC
  Filled 2018-01-22: qty 0.5

## 2018-01-22 MED ORDER — SIMETHICONE 80 MG PO CHEW: 80.0000 mg | CHEWABLE_TABLET | ORAL | Status: DC | PRN

## 2018-01-22 MED ORDER — FENTANYL 2.5 MCG/ML BUPIVACAINE 1/10 % EPIDURAL INFUSION (WH - ANES)
14.0000 mL/h | INTRAMUSCULAR | Status: DC | PRN
  Administered 2018-01-22: 14 mL/h via EPIDURAL
  Filled 2018-01-22: qty 100

## 2018-01-22 MED ORDER — TERBUTALINE SULFATE 1 MG/ML IJ SOLN: 0.2500 mg | Freq: Once | INTRAMUSCULAR | Status: DC | PRN

## 2018-01-22 MED ORDER — DIPHENHYDRAMINE HCL 50 MG/ML IJ SOLN: 12.5000 mg | INTRAMUSCULAR | Status: DC | PRN

## 2018-01-22 MED ORDER — LACTATED RINGERS IV SOLN
500.0000 mL | Freq: Once | INTRAVENOUS | Status: AC
  Administered 2018-01-22: 500 mL via INTRAVENOUS

## 2018-01-22 MED ORDER — DIBUCAINE 1 % RE OINT
1.0000 "application " | TOPICAL_OINTMENT | RECTAL | Status: DC | PRN
  Filled 2018-01-22: qty 28

## 2018-01-22 MED ORDER — LACTATED RINGERS IV SOLN
INTRAVENOUS | Status: DC
  Administered 2018-01-22 (×3): via INTRAVENOUS

## 2018-01-22 MED ORDER — ONDANSETRON HCL 4 MG/2ML IJ SOLN: 4.0000 mg | Freq: Four times a day (QID) | INTRAMUSCULAR | Status: DC | PRN

## 2018-01-22 MED ORDER — SOD CITRATE-CITRIC ACID 500-334 MG/5ML PO SOLN: 30.0000 mL | ORAL | Status: DC | PRN

## 2018-01-22 MED ORDER — FENTANYL CITRATE (PF) 100 MCG/2ML IJ SOLN: 50.0000 ug | INTRAMUSCULAR | Status: DC | PRN

## 2018-01-22 MED ORDER — ONDANSETRON HCL 4 MG/2ML IJ SOLN: 4.0000 mg | INTRAMUSCULAR | Status: DC | PRN

## 2018-01-22 MED ORDER — MEASLES, MUMPS & RUBELLA VAC IJ SOLR
0.5000 mL | Freq: Once | INTRAMUSCULAR | Status: DC
  Filled 2018-01-22: qty 0.5

## 2018-01-22 MED ORDER — OXYCODONE-ACETAMINOPHEN 5-325 MG PO TABS: 1.0000 | ORAL_TABLET | ORAL | Status: DC | PRN

## 2018-01-22 MED ORDER — LIDOCAINE HCL (PF) 1 % IJ SOLN: 30.0000 mL | INTRAMUSCULAR | Status: DC | PRN

## 2018-01-22 MED ORDER — BISACODYL 10 MG RE SUPP
10.0000 mg | Freq: Every day | RECTAL | Status: DC | PRN
  Filled 2018-01-22: qty 1

## 2018-01-22 MED ORDER — SENNOSIDES-DOCUSATE SODIUM 8.6-50 MG PO TABS
2.0000 | ORAL_TABLET | ORAL | Status: DC
  Administered 2018-01-23 (×2): 2 via ORAL
  Filled 2018-01-22 (×2): qty 2

## 2018-01-22 MED ORDER — WITCH HAZEL-GLYCERIN EX PADS: 1.0000 "application " | MEDICATED_PAD | CUTANEOUS | Status: DC | PRN

## 2018-01-22 NOTE — Progress Notes (Signed)
Patient ID: Samantha Sandoval, female   DOB: 07/19/1995, 22 y.o.   MRN: [id number] Samantha PonsRachel Sandoval is a 22 y.o. G3P1011 at 2973w5d admitted for early labor  Subjective: Comfortable w/ epidural, no complaints  Objective: BP 110/61   Pulse 97   Temp 98.5 F (36.9 C) (Oral)   Resp 16   Ht 5\' 3"  (1.6 m)   Wt 99.3 kg   LMP 04/19/2017 (Approximate)   SpO2 100%   BMI 38.79 kg/m  No intake/output data recorded.  FHT:  FHR: 140 bpm, variability: moderate,  accelerations:  Present,  decelerations:  Absent UC:   regular, every 2-4 minutes  SVE:   Dilation: 5 Effacement (%): 90 Station: -2 Exam by:: kbooker, cnm  Pitocin @ 6 mu/min  Labs: Lab Results  Component Value Date   WBC 13.4 (H) 01/22/2018   HGB 12.0 01/22/2018   HCT 38.7 01/22/2018   MCV 80.0 01/22/2018   PLT 213 01/22/2018    Assessment / Plan: Augmentation of labor, progressing well on pitocin, now AROM'd  Labor: Progressing normally Fetal Wellbeing:  Category I Pain Control:  Epidural Pre-eclampsia: n/a I/D:  n/a Anticipated MOD:  NSVD  Cheral MarkerKimberly R Cheryll Keisler CNM, WHNP-BC 01/22/2018, 8:44 PM

## 2018-01-22 NOTE — MAU Note (Signed)
Presents with ctxs since 0854 this morning with pink tinged vaginal discharge.    Denies LOF.  Reports +FM.

## 2018-01-22 NOTE — Progress Notes (Signed)
FHT auscultated x80 seconds.

## 2018-01-22 NOTE — Anesthesia Preprocedure Evaluation (Signed)
Anesthesia Evaluation  Patient identified by MRN, date of birth, ID band Patient awake    Reviewed: Allergy & Precautions, NPO status , Patient's Chart, lab work & pertinent test results  Airway Mallampati: II  TM Distance: >3 FB Neck ROM: Full    Dental no notable dental hx. (+) Teeth Intact   Pulmonary neg pulmonary ROS, former smoker,    Pulmonary exam normal breath sounds clear to auscultation       Cardiovascular negative cardio ROS Normal cardiovascular exam Rhythm:Regular Rate:Normal     Neuro/Psych PSYCHIATRIC DISORDERS Anxiety negative neurological ROS     GI/Hepatic negative GI ROS, Neg liver ROS,   Endo/Other  negative endocrine ROS  Renal/GU negative Renal ROS  negative genitourinary   Musculoskeletal negative musculoskeletal ROS (+)   Abdominal   Peds negative pediatric ROS (+)  Hematology negative hematology ROS (+)   Anesthesia Other Findings   Reproductive/Obstetrics (+) Pregnancy                             Anesthesia Physical Anesthesia Plan  ASA: II  Anesthesia Plan: Epidural   Post-op Pain Management:    Induction:   PONV Risk Score and Plan: 2 and Treatment may vary due to age or medical condition  Airway Management Planned: Natural Airway  Additional Equipment:   Intra-op Plan:   Post-operative Plan:   Informed Consent: I have reviewed the patients History and Physical, chart, labs and discussed the procedure including the risks, benefits and alternatives for the proposed anesthesia with the patient or authorized representative who has indicated his/her understanding and acceptance.     Plan Discussed with: Anesthesiologist  Anesthesia Plan Comments: (Patient identified. Risks, benefits, options discussed with patient including but not limited to bleeding, infection, nerve damage, paralysis, failed block, incomplete pain control, headache, blood  pressure changes, nausea, vomiting, reactions to medication, itching, and post partum back pain. Confirmed with bedside nurse the patient's most recent platelet count. Confirmed with the patient that they are not taking any anticoagulation, have any bleeding history or any family history of bleeding disorders. Patient expressed understanding and wishes to proceed. All questions were answered. )        Anesthesia Quick Evaluation

## 2018-01-22 NOTE — Progress Notes (Signed)
Pt informed she may ambulate.  Instructed to return @ 1520 for reeval or sooner if ROM or VB.  Pt verbalized understanding & agreeable.

## 2018-01-22 NOTE — H&P (Addendum)
LABOR AND DELIVERY ADMISSION HISTORY AND PHYSICAL NOTE  Samantha Sandoval is a 22 y.o. female G74P1011 with IUP at [redacted]w[redacted]d by [place] 6wk presenting for induction of labor for patient election. States that main support person will not be present later. Feeling painful contractions at this time.  She reports positive fetal movement. She denies leakage of fluid or vaginal bleeding.  [facility] History/Complications[facility] Pregnancy complications:  - none  Past Medical History: Past Medical History:  Diagnosis Date  . Anxiety   . Vaginal Pap smear, abnormal     Past Surgical History: Past Surgical History:  Procedure Laterality Date  . ABDOMINAL SURGERY    . CERVICAL ABLATION N/A 03/28/2017   Procedure: Laser Ablation of Cervix;  Surgeon: Lazaro Arms, MD;  Location: [facility];  Service: Gynecology;  Laterality: N/A;  . tubes in ears    . 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 Marital status: Married    Spouse name: Not on file  . Number of children: 1  . Years of education: Not on file  . Highest education level: Not on file  Occupational History  . Not on file  Social Needs  . Financial resource strain: Not hard at all  . Food insecurity:    Worry: Never true    Inability: Never true  . Transportation needs:    Medical: No    Non-medical: Not on file  Tobacco Use  . Smoking status: Former Smoker    Packs/day: 0.25    Types: Cigarettes  . Smokeless tobacco: Never Used  Substance and Sexual Activity  . Alcohol use: No    Comment: none  . Drug use: No    Comment: denies  . Sexual activity: Yes    Birth control/protection: None  Lifestyle  . Physical activity:    Days per week: Not on file    Minutes per session: Not on  file  . Stress: To some extent  Relationships  . Social connections:    Talks on phone: Not on file    Gets together: Not on file    Attends religious service: Not on file    Active member of club or organization: Not on file    Attends meetings of clubs or organizations: Not on file    Relationship status: Not on file  Other Topics Concern  . Not on file  Social History Narrative  . Not on file    Family History: Family History  Problem Relation Age of Onset  . Diabetes Maternal Grandmother   . Hypertension Maternal Grandmother   . COPD Maternal Grandmother   . Scoliosis Maternal Grandmother   . Heart disease Maternal Grandmother   . Hypertension Maternal Grandfather   . Other Maternal Grandfather        2 stents in heart  . Heart attack Paternal Grandfather   . Congestive Heart Failure Neg Hx     Allergies: Allergies  Allergen Reactions  . Hibiclens [Chlorhexidine] Hives  Pt states that her skin burns.    No medications prior to admission.     Review of Systems  All systems reviewed and negative except as stated in HPI  Physical Exam Blood pressure (!) 107/51, pulse 95, temperature 98.5 F (36.9 C), temperature source Oral, resp. rate 18, height 5\' 3"  (1.6 m), weight 99.3 kg, last menstrual period 04/19/2017, SpO2 100 %, not currently breastfeeding. General appearance: alert, oriented, NAD Lungs: normal respiratory effort Heart: regular rate Abdomen: soft, non-tender; gravid, FH appropriate for GA Extremities: No calf swelling or tenderness Presentation: cephalic Fetal monitoring: category I, moderate variability, no decelerations  Uterine activity: intermittent contractions Dilation: 4 Effacement (%): 70 Station: -3 Exam by:: dr Emmi Wertheim  Prenatal labs: ABO, Rh: --/--/O POS (11/19 1626) Antibody: NEG (11/19 1626) Rubella: 3.23 (04/16 1154) RPR: Non Reactive (09/09 0814)  HBsAg: Negative (11/04 1338)  HIV: Non Reactive (09/09 0814)  GC/Chlamydia:  neg GBS: Negative (10/28 0000)  2-hr GTT: normal Genetic screening:  NT/IT: neg Anatomy US: normal  Prenatal Transfer Tool  Maternal Diabetes: No Genetic Screening: Normal Maternal Ultrasounds/Referrals: Normal Fetal Ultrasounds or other Referrals:  None Maternal Substance Abuse:  No Significant Maternal Medications:  None Significant Maternal Lab Results: None  Results for orders placed or performed during the hospital encounter of 01/22/18 (from the past 24 hour(s))  Urinalysis, Routine w reflex microscopic   Collection Time: 01/22/18  1:47 PM  Result Value Ref Range   Color, Urine YELLOW YELLOW   APPearance HAZY (A) CLEAR   Specific Gravity, Urine 1.015 1.005 - 1.030   pH 6.0 5.0 - 8.0   Glucose, UA NEGATIVE NEGATIVE mg/dL   Hgb urine dipstick LARGE (A) NEGATIVE   Bilirubin Urine NEGATIVE NEGATIVE   Ketones, ur NEGATIVE NEGATIVE mg/dL   Protein, ur NEGATIVE NEGATIVE mg/dL   Nitrite NEGATIVE NEGATIVE   Leukocytes, UA MODERATE (A) NEGATIVE   RBC / HPF 0-5 0 - 5 RBC/hpf   WBC, UA 21-50 0 - 5 WBC/hpf   Bacteria, UA RARE (A) NONE SEEN   Squamous Epithelial / LPF 6-10 0 - 5   Mucus PRESENT   CBC   Collection Time: 01/22/18  4:26 PM  Result Value Ref Range   WBC 13.4 (H) 4.0 - 10.5 K/uL   RBC 4.84 3.87 - 5.11 MIL/uL   Hemoglobin 12.0 12.0 - 15.0 g/dL   HCT 16.1 09.6 - 04.5 %   MCV 80.0 80.0 - 100.0 fL   MCH 24.8 (L) 26.0 - 34.0 pg   MCHC 31.0 30.0 - 36.0 g/dL   RDW 40.9 81.1 - 91.4 %   Platelets 213 150 - 400 K/uL   nRBC 0.0 0.0 - 0.2 %  Type and screen   Collection Time: 01/22/18  4:26 PM  Result Value Ref Range   ABO/RH(D) O POS    Antibody Screen NEG    Sample Expiration      01/25/2018 Performed at [facility], [address], [place], [place] [address]     Patient Active Problem List   Diagnosis Date Noted  . Supervision of normal pregnancy 06/19/2017  . H/O laser treatment of cervix complicating pregnancy 06/19/2017  . Low grade squamous  intraepithelial lesion (LGSIL) on Papanicolaou smear of cervix 10/13/2015  . Family history of ovarian cancer 10/06/2015    Assessment: Samantha Sandoval is a 22 y.o. G3P1011 at [redacted]w[redacted]d here for induction of labor  #Labor: induction with pitocin #Pain: epidural  #FWB: Category I #ID:  GBS neg #MOF: breast #  [id number]:[id number] #Circ:  na  Jamelle Rushinghelsey Anderson DO PGY-1 FM resident 01/22/2018, 6:38 PM   OB FELLOW HISTORY AND PHYSICAL ATTESTATION  I have seen and examined this patient; I agree with above documentation in the resident's note.   Gwenevere AbbotNimeka Michaella Imai, MD OB Fellow  01/22/2018, 7:22 PM

## 2018-01-22 NOTE — Anesthesia Procedure Notes (Signed)
Epidural Patient location during procedure: OB Start time: 01/22/2018 6:55 PM End time: 01/22/2018 7:10 PM  Staffing Anesthesiologist: Elmer PickerWoodrum, Jammy Plotkin L, MD Performed: anesthesiologist   Preanesthetic Checklist Completed: patient identified, pre-op evaluation, timeout performed, IV checked, risks and benefits discussed and monitors and equipment checked  Epidural Patient position: sitting Prep: site prepped and draped and DuraPrep Patient monitoring: continuous pulse ox, blood pressure, heart rate and cardiac monitor Approach: midline Location: L3-L4 Injection technique: LOR air  Needle:  Needle type: Tuohy  Needle gauge: 17 G Needle length: 9 cm Needle insertion depth: 8 cm Catheter type: closed end flexible Catheter size: 19 Gauge Catheter at skin depth: 13 cm Test dose: negative  Assessment Sensory level: T8 Events: blood not aspirated, injection not painful, no injection resistance, negative IV test and no paresthesia  Additional Notes Patient identified. Risks/Benefits/Options discussed with patient including but not limited to bleeding, infection, nerve damage, paralysis, failed block, incomplete pain control, headache, blood pressure changes, nausea, vomiting, reactions to medication both or allergic, itching and postpartum back pain. Confirmed with bedside nurse the patient's most recent platelet count. Confirmed with patient that they are not currently taking any anticoagulation, have any bleeding history or any family history of bleeding disorders. Patient expressed understanding and wished to proceed. All questions were answered. Sterile technique was used throughout the entire procedure. Please see nursing notes for vital signs. Test dose was given through epidural catheter and negative prior to continuing to dose epidural or start infusion. Warning signs of high block given to the patient including shortness of breath, tingling/numbness in hands, complete motor block,  or any concerning symptoms with instructions to call for help. Patient was given instructions on fall risk and not to get out of bed. All questions and concerns addressed with instructions to call with any issues or inadequate analgesia.  Reason for block:procedure for pain

## 2018-01-22 NOTE — Telephone Encounter (Signed)
Preadmission screen  

## 2018-01-22 NOTE — Progress Notes (Addendum)
LABOR PROGRESS NOTE  Samantha PonsRachel Sandoval is a 22 y.o. G3P1011 at 7138w5d  admitted for IOL  Subjective: Patient appears to be resting comfortably in bed. She states her pain of contractions is 7/10. She is requesting epidural  Objective: BP (!) 107/51 (BP Location: Right Arm)   Pulse 95   Temp 98.5 F (36.9 C) (Oral)   Resp 18   Ht 5\' 3"  (1.6 m)   Wt 99.3 kg   LMP 04/19/2017 (Approximate)   SpO2 100%   BMI 38.79 kg/m  or  Vitals:   01/22/18 1339 01/22/18 1656 01/22/18 1718  BP: 125/71 109/71 (!) 107/51  Pulse: (!) 128 (!) 113 95  Resp: 20 20 18   Temp: 98.1 F (36.7 C) 98.6 F (37 C) 98.5 F (36.9 C)  TempSrc: Oral Oral Oral  SpO2: 100%    Weight:   99.3 kg  Height: 5\' 3"  (1.6 m)  5\' 3"  (1.6 m)    Dilation: 4 Effacement (%): 70 Cervical Position: Middle Station: -3 Presentation: Vertex Exam by:: dr Gadge Hermiz FHT: baseline rate 145, moderate varibility, intermittent acel, no decel Toco: starting pitocin after epidural   Labs: Lab Results  Component Value Date   WBC 13.4 (H) 01/22/2018   HGB 12.0 01/22/2018   HCT 38.7 01/22/2018   MCV 80.0 01/22/2018   PLT 213 01/22/2018    Patient Active Problem List   Diagnosis Date Noted  . Supervision of normal pregnancy 06/19/2017  . H/O laser treatment of cervix complicating pregnancy 06/19/2017  . 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 Family history of ovarian cancer 10/06/2015    Assessment / Plan: 22 y.o. G3P1011 at 6738w5d here for induction  Labor: induction with pitocin at this time Fetal Wellbeing:  Category I Pain Control:  Placing epidural Anticipated MOD:  Vaginal   Jamelle Rushingchelsey Anderson DO PGY-1 FM resident 01/22/2018, 6:51 PM   OB FELLOW LABOR PROGRESS NOTE ATTESTATION  I have seen and examined this patient and agree with above documentation in the resident's note.   Gwenevere AbbotNimeka Pa Tennant, MD OB Fellow  01/22/2018, 11:09 PM   Gwenevere AbbotNimeka Jaelen Soth, MD OB Fellow   01/22/2018, 11:08 PM

## 2018-01-23 ENCOUNTER — Inpatient Hospital Stay (HOSPITAL_COMMUNITY): Admission: RE | Admit: 2018-01-23 | Payer: BLUE CROSS/BLUE SHIELD | Source: Ambulatory Visit

## 2018-01-23 LAB — RPR: RPR Ser Ql: NONREACTIVE

## 2018-01-23 NOTE — Lactation Note (Signed)
This note was copied from a baby's chart. Lactation Consultation Note Mom has decided to only formula feed.  Discussed engorgement. Left brochure if needed.  Patient Name: Samantha Sandoval [id number]'NToday's Date: 01/23/2018     Maternal Data    Feeding Feeding Type: Formula  LATCH Score                   Interventions    Lactation Tools Discussed/Used     Consult Status      Charyl DancerCARVER, Jermale Crass G 01/23/2018, 10:15 PM

## 2018-01-23 NOTE — Progress Notes (Deleted)
Lactation Consultation Note  Patient Name: Shawnie PonsRachel Staron ZOXWR'UToday's Date: 01/23/2018   Patient resting in bed, visiting with family/FOB at bedside. Baby deeply asleep in arms of PGM, no feeding cues exhibited.  Maternal Data   22 y.o. [id number] at 5861w5d on PPD#1 Denies nipple pain during or between feedings.   Feeding  Reports baby is feeding well with audible swallows.   Lactation Tools Discussed/Used  Discussed latch techniques, how to tell if baby is getting milk, feeding cues, the difference between colostrum and mature milk, and postpartum community breastfeeding resources.    Bernerd LimboJamilla R Pascual Mantel, SNM, IBCLC 01/23/2018, 11:16 AM

## 2018-01-23 NOTE — Anesthesia Postprocedure Evaluation (Signed)
Anesthesia Post Note  Patient: Samantha Sandoval  Procedure(s) Performed: AN AD HOC LABOR EPIDURAL     Patient location during evaluation: Mother Baby Anesthesia Type: Epidural Level of consciousness: awake and alert Pain management: pain level controlled Vital Signs Assessment: post-procedure vital signs reviewed and stable Respiratory status: spontaneous breathing, nonlabored ventilation and respiratory function stable Cardiovascular status: stable Postop Assessment: no headache, no backache and epidural receding Anesthetic complications: no    Last Vitals:  Vitals:   01/23/18 0055 01/23/18 0500  BP: 107/65 97/61  Pulse: 92 67  Resp: 16 16  Temp: 37 C 37 C  SpO2: 100% 99%    Last Pain:  Vitals:   01/23/18 0500  TempSrc: Oral  PainSc: 0-No pain   Pain Goal: Patients Stated Pain Goal: Other (Comment)(until pitocin started per pt) (01/22/18 1750)               Thelbert Gartin

## 2018-01-23 NOTE — Progress Notes (Signed)
POSTPARTUM PROGRESS NOTE  Post Partum Day 1  Subjective:  Shawnie PonsRachel Kukla is a 22 y.o. [id number] s/p SVD at 8145w5d.  She reports she is doing well. No acute events overnight. She denies any problems with ambulating or po intake. She has passed flatus but no bowel movement. Denies nausea or vomiting.  Pain is well controlled.  Lochia is mild. Denies dizziness, lightheadedness, calf pain, SOB.  Objective: Blood pressure 97/61, pulse 67, temperature 98.6 F (37 C), temperature source Oral, resp. rate 16, height 5\' 3"  (1.6 m), weight 99.3 kg, last menstrual period 04/19/2017, SpO2 99 %, unknown if currently breastfeeding.  Physical Exam:  General: alert, cooperative and no distress Chest: no respiratory distress Heart:regular rate, distal pulses intact Abdomen: soft, nontender,  Uterine Fundus: firm, appropriately tender DVT Evaluation: No calf swelling or tenderness Extremities: no LE edema Skin: warm, dry  Recent Labs    01/22/18 1626  HGB 12.0  HCT 38.7    Assessment/Plan: Shawnie PonsRachel Rhodus is a 22 y.o. [id number] s/p SVD at 1145w5d   PPD#1 - Doing well  Routine postpartum care Contraception: Nexplanon Feeding: Breast + formula until milk lets  - Lactation consult Dispo: Plan for discharge tomorrow.   LOS: 1 day   Matthew SarasSean Yer Castello, [facility] MS3 01/23/2018, 7:18 AM

## 2018-01-24 MED ORDER — SENNOSIDES-DOCUSATE SODIUM 8.6-50 MG PO TABS: 2.0000 | ORAL_TABLET | ORAL | 0 refills | Status: DC

## 2018-01-24 MED ORDER — IBUPROFEN 600 MG PO TABS: 600.0000 mg | ORAL_TABLET | Freq: Four times a day (QID) | ORAL | 0 refills | Status: DC

## 2018-01-24 NOTE — Progress Notes (Signed)
Patient called RN and asked for bp check. States she felt dizzy after walking back from vending machine, bp was 117/65, pulse 84. Encouraged patient to rest and call to report further concerns.

## 2018-01-24 NOTE — Progress Notes (Signed)
MOB was referred for history of depression/anxiety. * Referral screened out by Clinical Social Worker because none of the following criteria appear to apply: ~ History of anxiety/depression during this pregnancy, or of post-partum depression following prior delivery. ~ Diagnosis of anxiety and/or depression within last 3 years OR * MOB's symptoms currently being treated with medication and/or therapy.  Per chart review, no concerns noted during prenatal care.   Please contact the Clinical Social Worker if needs arise, by MOB request, or if MOB scores greater than 9/yes to question 10 on Edinburgh Postpartum Depression Screen.    Kissa Campoy, LCSWA Clinical Social Worker [facility] Cell#: [phone]           

## 2018-01-24 NOTE — Discharge Summary (Addendum)
Postpartum Discharge Summary     Patient Name: Samantha PonsRachel Pieri DOB: 08/19/1995 MRN: [id number]  Date of admission: 01/22/2018 Delivering Provider: Shawna Sandoval, Samantha Sandoval   Date of discharge: 01/24/2018  Admitting diagnosis: 39wks, CTX 5-436min Intrauterine pregnancy: 5978w5d     Secondary diagnosis:  Principal Problem:   Encounter for elective induction of labor  Additional problems: none     Discharge diagnosis: Term Pregnancy Delivered                                                                                                Post partum procedures:none  Augmentation: AROM and Pitocin  Complications: None  Hospital course:  Induction of Labor With Vaginal Delivery   22 y.o. yo [id number] at 5578w5d was admitted to the hospital 01/22/2018 for induction of labor.  Indication for induction: Favorable cervix at term.  Patient had an uncomplicated labor course as follows: Membrane Rupture Time/Date: 8:38 PM ,01/22/2018   Intrapartum Procedures: Episiotomy: None [1]                                         Lacerations:  None [1]  Patient had delivery of a Viable infant.  Information for the patient's newborn:  Marcene CorningRigney, Girl Fleet ContrasRachel [[id number]][[id number]]  Delivery Method: Vaginal, Spontaneous(Filed from Delivery Summary)   01/22/2018  Details of delivery can be found in separate delivery note.  Patient had a routine postpartum course. Patient is discharged home 01/24/18.  Magnesium Sulfate recieved: No BMZ received: No  Physical exam  Vitals:   01/23/18 1507 01/23/18 2227 01/24/18 0045 01/24/18 0602  BP: (!) 99/55 98/67 117/65 (!) 101/57  Pulse: 79 77 84 65  Resp: 16 18  16   Temp: 98.2 F (36.8 C) 98.6 F (37 C)  98.6 F (37 C)  TempSrc:    Oral  SpO2:      Weight:      Height:       General: alert, cooperative and no distress Lochia: appropriate Uterine Fundus: firm Incision: N/A DVT Evaluation: No evidence of DVT seen on physical exam. No cords or calf tenderness. No significant  calf/ankle edema. Labs: Lab Results  Component Value Date   WBC 13.4 (H) 01/22/2018   HGB 12.0 01/22/2018   HCT 38.7 01/22/2018   MCV 80.0 01/22/2018   PLT 213 01/22/2018   CMP Latest Ref Rng & Units 03/22/2017  Glucose 65 - 99 mg/dL 99  BUN 6 - 20 mg/dL 9  Creatinine 4.250.44 - 9.561.00 mg/dL 3.870.74  Sodium 564135 - 332145 mmol/L 136  Potassium 3.5 - 5.1 mmol/L 3.8  Chloride 101 - 111 mmol/L 106  CO2 22 - 32 mmol/L 22  Calcium 8.9 - 10.3 mg/dL 9.5(J8.8(L)  Total Protein 6.5 - 8.1 g/dL 7.0  Total Bilirubin 0.3 - 1.2 mg/dL 0.5  Alkaline Phos 38 - 126 U/L 87  AST 15 - 41 U/L 15  ALT 14 - 54 U/L 17    Discharge instruction: per After Visit Summary and "Baby and Me Booklet".  After visit meds:  Allergies as of 01/24/2018      Reactions   Hibiclens [chlorhexidine] Hives   Pt states that her skin burns.      Medication List    TAKE these medications   ibuprofen 600 MG tablet Commonly known as:  ADVIL,MOTRIN Take 1 tablet (600 mg total) by mouth every 6 (six) hours.   senna-docusate 8.6-50 MG tablet Commonly known as:  Senokot-S Take 2 tablets by mouth daily. Start taking on:  01/25/2018       Diet: routine diet  Activity: Advance as tolerated. Pelvic rest for 6 weeks.   Outpatient follow up:6 weeks Follow up Appt: Future Appointments  Date Time Provider Department Center  03/04/2018 11:30 AM Cheral Marker, CNM [facility]   Follow up Visit:   Please schedule this patient for Postpartum visit in: 6 weeks with the following provider: Any provider For C/S patients schedule nurse incision check in weeks 2 weeks: no Low risk pregnancy complicated by: n/a Delivery mode:  SVD Anticipated Birth Control:  nexplanon PP Procedures needed: none  Schedule Integrated BH visit: no    Newborn Data: Live born female  Birth Weight: 7 lb 11 oz (3487 g) APGAR: 9, 9  Newborn Delivery   Birth date/time:  01/22/2018 22:03:00 Delivery type:  Vaginal, Spontaneous     Baby  Feeding: Bottle Disposition:home with mother   01/24/2018 [place] Shirley, DO   OB FELLOW DISCHARGE ATTESTATION  I have seen and examined this patient and agree with above documentation in the resident's note.   Marcy Siren, D.O. OB Fellow  01/24/2018, 7:58 AM

## 2018-01-24 NOTE — Discharge Instructions (Signed)

## 2018-01-27 ENCOUNTER — Encounter (HOSPITAL_COMMUNITY): Payer: Self-pay | Admitting: *Deleted

## 2018-01-27 ENCOUNTER — Inpatient Hospital Stay (HOSPITAL_COMMUNITY)
Admission: AD | Admit: 2018-01-27 | Discharge: 2018-01-27 | Disposition: A | Discharge status: Home / Self Care | Payer: BLUE CROSS/BLUE SHIELD | Source: Ambulatory Visit | Attending: Obstetrics & Gynecology | Admitting: Obstetrics & Gynecology

## 2018-01-27 DIAGNOSIS — Z87891 Personal history of nicotine dependence: Secondary | ICD-10-CM | POA: Insufficient documentation

## 2018-01-27 DIAGNOSIS — Z79899 Other long term (current) drug therapy: Secondary | ICD-10-CM | POA: Insufficient documentation

## 2018-01-27 DIAGNOSIS — F419 Anxiety disorder, unspecified: Secondary | ICD-10-CM | POA: Insufficient documentation

## 2018-01-27 DIAGNOSIS — Z833 Family history of diabetes mellitus: Secondary | ICD-10-CM | POA: Diagnosis not present

## 2018-01-27 DIAGNOSIS — Z888 Allergy status to other drugs, medicaments and biological substances status: Secondary | ICD-10-CM | POA: Insufficient documentation

## 2018-01-27 DIAGNOSIS — R109 Unspecified abdominal pain: Secondary | ICD-10-CM | POA: Diagnosis present

## 2018-01-27 DIAGNOSIS — Z791 Long term (current) use of non-steroidal anti-inflammatories (NSAID): Secondary | ICD-10-CM | POA: Insufficient documentation

## 2018-01-27 MED ORDER — MISOPROSTOL 200 MCG PO TABS: 200.0000 ug | ORAL_TABLET | Freq: Three times a day (TID) | ORAL | 1 refills | Status: DC

## 2018-01-27 NOTE — Discharge Instructions (Signed)
You appear to have passed a piece of amniotic sac. You do not have a evidence of infection or more tissue in you uterus, but I have prescribed a medicine called Cycotec (Misoprostol) to empty out any other tissue from your uterus just to be thorough. Call the office or return to MAU if you have fever greater that 100.4, heavy bleeding or severe abdominal pain that does not go away with Ibuprofen. It is normal to have more cramping after the doses of Cycotec (Misoprostol)

## 2018-01-27 NOTE — MAU Note (Signed)
SVD on 01/22/18  States when she went to the bathroom this morning when she wiped she saw a "thick string" on the toilet paper  Reports her normal PP bleeding since she saw this  Having some cramping, 4/10, intermittent, ongoing since delivery

## 2018-01-27 NOTE — MAU Provider Note (Signed)
Chief Complaint: Abdominal Pain and Vaginal Bleeding   First Provider Initiated Contact with Patient 01/27/18 1221     SUBJECTIVE HPI: Samantha Sandoval is a 22 y.o. G3P2012 at 5 days S/P uncomplicated SVD who presents to Maternity Admissions reporting passing tissue. Has picture of it on her phone. Looks like membranes.   Associated signs and symptoms: Neg for fever, chills. Pos for light to mod bleeding and mild cramping similar to after other birth.    Past Medical History:  Diagnosis Date  . Anxiety   . Vaginal Pap smear, abnormal    OB History  Gravida Para Term Preterm AB Living  3 2 2   1 2   SAB TAB Ectopic Multiple Live Births  1     0 2    # Outcome Date GA Lbr Len/2nd Weight Sex Delivery Anes PTL Lv  3 Term 01/22/18 [redacted]w[redacted]d 03:29 / 00:10 3487 g F Vag-Spont EPI  LIV     Birth Comments: WNL  2 SAB 02/26/17          1 Term 05/16/16 [redacted]w[redacted]d 18:10 / 00:28 3330 g M Vag-Spont EPI N LIV   Past Surgical History:  Procedure Laterality Date  . ABDOMINAL SURGERY    . CERVICAL ABLATION N/A 03/28/2017   Procedure: Laser Ablation of Cervix;  Surgeon: Lazaro Arms, MD;  Location: [facility];  Service: Gynecology;  Laterality: N/A;  . tubes in ears    . tumor 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     Social History   Socioeconomic History  . Marital status: Married    Spouse name: Not on file  . Number of children: 1  . Years of education: Not on file  . Highest education level: Not on file  Occupational History  . Not on file  Social Needs  . Financial resource strain: Not hard at all  . Food insecurity:    Worry: Never true    Inability: Never true  . Transportation needs:    Medical: No    Non-medical: Not on file  Tobacco Use  . Smoking status: Former Smoker    Packs/day: 0.25    Types: Cigarettes  . Smokeless tobacco: Never Used  Substance and Sexual Activity  . Alcohol use: No    Comment: none  . Drug use: No    Comment: denies  . Sexual activity: Yes    Birth control/protection: None   Lifestyle  . Physical activity:    Days per week: Not on file    Minutes per session: Not on file  . Stress: To some extent  Relationships  . Social connections:    Talks on phone: Not on file    Gets together: Not on file    Attends religious service: Not on file    Active member of club or organization: Not on file    Attends meetings of clubs or organizations: Not on file    Relationship status: Not on file  . Intimate partner violence:    Fear of current or ex partner: No    Emotionally abused: No    Physically abused: No    Forced sexual activity: No  Other Topics Concern  . Not on file  Social History Narrative  . Not on file   Family History  Problem Relation Age of Onset  . Diabetes Maternal Grandmother   . Hypertension Maternal Grandmother   . COPD Maternal Grandmother   . Scoliosis Maternal Grandmother   . Heart disease Maternal Grandmother   .  Hypertension Maternal Grandfather   . Other Maternal Grandfather        2 stents in heart  . Heart attack Paternal Grandfather   . Congestive Heart Failure Neg Hx    No current facility-administered medications on file prior to encounter.    Current Outpatient Medications on File Prior to Encounter  Medication Sig Dispense Refill  . ibuprofen (ADVIL,MOTRIN) 600 MG tablet Take 1 tablet (600 mg total) by mouth every 6 (six) hours. 30 tablet 0  . senna-docusate (SENOKOT-S) 8.6-50 MG tablet Take 2 tablets by mouth daily. 30 tablet 0   Allergies  Allergen Reactions  . Hibiclens [Chlorhexidine] Hives    Pt states that her skin burns.    I have reviewed patient's Past Medical Hx, Surgical Hx, Family Hx, Social Hx, medications and allergies.   Review of Systems  Constitutional: Negative for chills and fever.  Gastrointestinal: Positive for abdominal pain. Negative for constipation, diarrhea, nausea and vomiting.  Genitourinary: Positive for vaginal bleeding. Negative for dysuria, frequency and vaginal discharge.   Musculoskeletal: Negative for back pain.  Neurological: Negative for dizziness.    OBJECTIVE Patient Vitals for the past 24 hrs:  BP Temp Temp src Pulse Resp Weight  01/27/18 1148 105/65 98.2 F (36.8 C) Oral 100 18 93.3 kg   Constitutional: Well-developed, well-nourished female in no acute distress.  Cardiovascular: normal rate Respiratory: normal rate and effort.  GI: Abd soft, non-tender, fundus non-palpable MS: Extremities nontender, no edema, normal ROM Neurologic: Alert and oriented x 4.  GU: Neg CVAT.  SPECULUM EXAM: NEFG, physiologic discharge, scant blood noted, normal odor, cervix clean. No tissue protruding through os  BIMANUAL: cervix closed; uterus slightly large, no adnexal tenderness or masses. No CMT.  LAB RESULTS No results found for this or any previous visit (from the past 24 hour(s)).  IMAGING NA  MAU COURSE Orders Placed This Encounter  Procedures  . Discharge patient   Meds ordered this encounter  Medications  . misoprostol (CYTOTEC) 200 MCG tablet    Sig: Take 1 tablet (200 mcg total) by mouth 3 (three) times daily. Place one tablet in between your gums and cheeks and let it dissolve.    Dispense:  3 tablet    Refill:  1    Order Specific Question:   Supervising Provider    Answer:   Duane LopeEURE, LUTHER H [[id number]]   Discussed Hx, labs, exam w/ Dr. Despina HiddenEure. Agrees w/ POC for cytotec.   MDM Pt appears to have passed fragment of amniotic sac. No evidence of additional retained products, but will Rx Cytotec in case there is any more. No evidence of infection.   ASSESSMENT 1. Retained products of conception, postpartum     PLAN Discharge home in stable condition. Retained POC and infection precautions Follow-up Information    [facility] Follow up on 03/04/2018.   Why:  as scheduled or sooner as needed if symptoms worsen Contact information: [address] [place] [phone]       [facility] Follow up.   Why:  as needed in pregnancy-related emergencies Contact information: [address] [id number] [place] [phone]         Allergies as of 01/27/2018      Reactions   Hibiclens [chlorhexidine] Hives   Pt states that her skin burns.      Medication List    TAKE these medications   ibuprofen 600 MG tablet Commonly known as:  ADVIL,MOTRIN Take 1 tablet (600 mg total) by mouth every 6 (six) hours.   misoprostol 200 MCG tablet Commonly known as:  CYTOTEC Take 1 tablet (200 mcg total) by mouth 3 (three) times daily. Place one tablet in between your gums and cheeks and let it dissolve.   senna-docusate 8.6-50 MG tablet Commonly known as:  Senokot-S Take 2 tablets by mouth daily.        Katrinka Blazing, [place], CNM 01/27/2018  1:40 PM

## 2018-02-17 ENCOUNTER — Encounter (HOSPITAL_COMMUNITY): Payer: Self-pay

## 2018-02-17 ENCOUNTER — Emergency Department (HOSPITAL_COMMUNITY): Payer: BLUE CROSS/BLUE SHIELD

## 2018-02-17 ENCOUNTER — Other Ambulatory Visit: Payer: Self-pay

## 2018-02-17 ENCOUNTER — Emergency Department (HOSPITAL_COMMUNITY)
Admission: EM | Admit: 2018-02-17 | Discharge: 2018-02-17 | Disposition: A | Discharge status: Home / Self Care | Payer: BLUE CROSS/BLUE SHIELD | Attending: Emergency Medicine | Admitting: Emergency Medicine

## 2018-02-17 DIAGNOSIS — W228XXA Striking against or struck by other objects, initial encounter: Secondary | ICD-10-CM | POA: Diagnosis not present

## 2018-02-17 DIAGNOSIS — S8002XA Contusion of left knee, initial encounter: Secondary | ICD-10-CM | POA: Diagnosis not present

## 2018-02-17 DIAGNOSIS — S8012XA Contusion of left lower leg, initial encounter: Secondary | ICD-10-CM | POA: Diagnosis not present

## 2018-02-17 DIAGNOSIS — Y999 Unspecified external cause status: Secondary | ICD-10-CM | POA: Diagnosis not present

## 2018-02-17 DIAGNOSIS — S8992XA Unspecified injury of left lower leg, initial encounter: Secondary | ICD-10-CM | POA: Diagnosis present

## 2018-02-17 DIAGNOSIS — Z87891 Personal history of nicotine dependence: Secondary | ICD-10-CM | POA: Diagnosis not present

## 2018-02-17 DIAGNOSIS — Y93H3 Activity, building and construction: Secondary | ICD-10-CM | POA: Insufficient documentation

## 2018-02-17 DIAGNOSIS — Y929 Unspecified place or not applicable: Secondary | ICD-10-CM | POA: Diagnosis not present

## 2018-02-17 MED ORDER — IBUPROFEN 400 MG PO TABS
600.0000 mg | ORAL_TABLET | Freq: Once | ORAL | Status: AC
  Administered 2018-02-17: 600 mg via ORAL
  Filled 2018-02-17: qty 2

## 2018-02-17 NOTE — ED Notes (Signed)
Dr K in to assess 

## 2018-02-17 NOTE — ED Provider Notes (Signed)
[facility] EMERGENCY DEPARTMENT Provider Note   CSN: [id number] Arrival date & time: 02/17/18  1613     History   Chief Complaint Chief Complaint  Patient presents with  . Leg Pain    HPI [name] is a 22 y.o. female.  HPI   [age]yF female with left medial knee/proximal lower leg pain.  Happened just before arrival.  She was using a log splitter when it kicked back and a piece of wood struck her in this area.  She has had persistent pain since then.  She can ambulate. No numbness or tingling. Denies other injuries.   Past Medical History:  Diagnosis Date  . Anxiety   . Vaginal Pap smear, abnormal     Patient Active Problem List   Diagnosis Date Noted  . Encounter for elective induction of labor 01/22/2018  . Supervision of normal pregnancy 06/19/2017  . H/O laser treatment of cervix complicating pregnancy 06/19/2017  . 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 Family history of ovarian cancer 10/06/2015    Past Surgical History:  Procedure Laterality Date  . ABDOMINAL SURGERY    . CERVICAL ABLATION N/A 03/28/2017   Procedure: Laser Ablation of Cervix;  Surgeon: Lazaro ArmsEure, Luther H, MD;  Location: [facility];  Service: Gynecology;  Laterality: N/A;  . tubes in ears    . tumor 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       OB History    Gravida  3   Para  2   Term  2   Preterm      AB  1   Living  2     SAB  1   TAB      Ectopic      Multiple  0   Live Births  2            Home Medications    Prior to Admission medications   Medication Sig Start Date End Date Taking? Authorizing Provider  ibuprofen (ADVIL,MOTRIN) 600 MG tablet Take 1 tablet (600 mg total) by mouth every 6 (six) hours. 01/24/18   Shirley, [place], DO  misoprostol (CYTOTEC) 200 MCG tablet Take 1 tablet (200 mcg total) by mouth 3 (three) times daily. Place one tablet in between your gums and cheeks and let it dissolve. 01/27/18   Katrinka BlazingSmith, [place], CNM    senna-docusate (SENOKOT-S) 8.6-50 MG tablet Take 2 tablets by mouth daily. 01/25/18   Shirley, [place], DO    Family History Family History  Problem Relation Age of Onset  . Diabetes Maternal Grandmother   . Hypertension Maternal Grandmother   . COPD Maternal Grandmother   . Scoliosis Maternal Grandmother   . Heart disease Maternal Grandmother   . Hypertension Maternal Grandfather   . Other Maternal Grandfather        2 stents in heart  . Heart attack Paternal Grandfather   . Congestive Heart Failure Neg Hx     Social History Social History   Tobacco Use  . Smoking status: Former Smoker    Packs/day: 0.25    Types: Cigarettes  . Smokeless tobacco: Never Used  Substance Use Topics  . Alcohol use: No    Comment: none  . Drug use: No    Comment: denies     Allergies   Hibiclens [chlorhexidine]   Review of Systems Review of Systems  All systems reviewed and negative, other than as noted in HPI.  Physical Exam Updated Vital Signs BP (!) 139/92 (  BP Location: Right Arm)   Pulse 95   Temp 98.2 F (36.8 C) (Oral)   Resp 12   Ht 5\' 1"  (1.549 m)   Wt 86 kg   SpO2 100%   Breastfeeding No Comment: deliver 01/22/18  BMI 35.82 kg/m   Physical Exam Vitals signs and nursing note reviewed.  Constitutional:      General: She is not in acute distress.    Appearance: She is well-developed.  HENT:     Head: Normocephalic and atraumatic.  Eyes:     General:        Right eye: No discharge.        Left eye: No discharge.     Conjunctiva/sclera: Conjunctivae normal.  Neck:     Musculoskeletal: Neck supple.  Cardiovascular:     Rate and Rhythm: Normal rate and regular rhythm.     Heart sounds: Normal heart sounds. No murmur. No friction rub. No gallop.   Pulmonary:     Effort: Pulmonary effort is normal. No respiratory distress.     Breath sounds: Normal breath sounds.  Abdominal:     General: There is no distension.     Palpations: Abdomen is soft.      Tenderness: There is no abdominal tenderness.  Musculoskeletal:        General: Swelling and tenderness present.     Comments: Mild swelling and ecchymosis and tenderness to the medial aspect of the left calf and medial knee.  Superficial abrasion overlying it.  She can actively range the knee.  No appreciable effusion.  Neurovascular intact distally.  Skin:    General: Skin is warm and dry.  Neurological:     Mental Status: She is alert.  Psychiatric:        Behavior: Behavior normal.        Thought Content: Thought content normal.      ED Treatments / Results  Labs (all labs ordered are listed, but only abnormal results are displayed) Labs Reviewed - No data to display  EKG None  Radiology Dg Knee Complete 4 Views Left  Result Date: 02/17/2018 CLINICAL DATA:  Hit in leg with piece of wood from log splitter. EXAM: LEFT KNEE - COMPLETE 4+ VIEW COMPARISON:  None. FINDINGS: No evidence of fracture, dislocation, or joint effusion. No evidence of arthropathy or other focal bone abnormality. Soft tissues are unremarkable. IMPRESSION: No acute abnormality at the left knee. Electronically Signed   By: Deatra Robinson M.D.   On: 02/17/2018 17:33    Procedures Procedures (including critical care time)  Medications Ordered in ED Medications  ibuprofen (ADVIL,MOTRIN) tablet 600 mg (has no administration in time range)     Initial Impression / Assessment and Plan / ED Course  I have reviewed the triage vital signs and the nursing notes.  Pertinent labs & imaging results that were available during my care of the patient were reviewed by me and considered in my medical decision making (see chart for details).     Leg contusion.  Closed injury.  Neurovascular intact.  Negative imaging.  NSAIDs as needed.  Elevate.  Ice for the next 24 hours.  Return precautions were discussed.  Activity as tolerated otherwise. Final Clinical Impressions(s) / ED Diagnoses   Final diagnoses:  Contusion  of left knee and lower leg, initial encounter    ED Discharge Orders    None       Raeford Razor, MD 02/17/18 1745

## 2018-02-17 NOTE — ED Triage Notes (Signed)
Pt reports she was using a wood splitter and wood kicked back on her left lower leg. Noted to have swelling, bruising and abraison

## 2018-02-20 ENCOUNTER — Ambulatory Visit (INDEPENDENT_AMBULATORY_CARE_PROVIDER_SITE_OTHER): Payer: BLUE CROSS/BLUE SHIELD | Admitting: Adult Health

## 2018-02-20 ENCOUNTER — Encounter: Payer: Self-pay | Admitting: Adult Health

## 2018-02-20 VITALS — BP 94/57 | HR 69 | Ht 63.0 in | Wt 209.0 lb

## 2018-02-20 DIAGNOSIS — N898 Other specified noninflammatory disorders of vagina: Secondary | ICD-10-CM

## 2018-02-20 DIAGNOSIS — B379 Candidiasis, unspecified: Secondary | ICD-10-CM | POA: Insufficient documentation

## 2018-02-20 LAB — POCT WET PREP (WET MOUNT): Clue Cells Wet Prep Whiff POC: NEGATIVE

## 2018-02-20 MED ORDER — FLUCONAZOLE 150 MG PO TABS: ORAL_TABLET | ORAL | 1 refills | Status: DC

## 2018-02-20 NOTE — Progress Notes (Signed)
Patient ID: Samantha Sandoval, female   DOB: 04/22/1995, 22 y.o.   MRN: [id number] History of Present Illness: Samantha Sandoval is a 22 year old white female, married, who recently delivered vaginally on 01/22/18, has stopped bleeding and has had sex,(without condom) and now has itching.   Current Medications, Allergies, Past Medical History, Past Surgical History, Family History and Social History were reviewed in [facility] electronic medical record.     Review of Systems:  Vaginal discharge and itching    Physical Exam:BP (!) 94/57 (BP Location: Left Arm, Patient Position: Sitting, Cuff Size: Normal)   Pulse 69   Ht 5\' 3"  (1.6 m)   Wt 209 lb (94.8 kg)   Breastfeeding No   BMI 37.02 kg/m  General:  Well developed, well nourished, no acute distress Skin:  Warm and dry Pelvic:  External genitalia is normal in appearance, no lesions.  The vagina is normal in appearance,creamy white discharge, without odor. Urethra has no lesions or masses. The cervix is bulbous, and smooth, no CMT.  Uterus is felt to be NSSC.  No adnexal masses or tenderness noted.Bladder is non tender, no masses felt.Wet prep showed few WBCs and few yeast. Psych:  No mood changes, alert and cooperative,seems happy   Impression: 1. Vaginal discharge   2. Itching in the vaginal area   3. Yeast infection       Plan: No more sex, if wants nexplanon, but if has sex use condoms from now on, may want the ring or the pill  Get insurance checked on nexplanon Has postpartum visit 12/30 with Samantha BattenKim  Meds ordered this encounter  Medications  . fluconazole (DIFLUCAN) 150 MG tablet    Sig: Take 1 now and repeat 1 in 3 days    Dispense:  2 tablet    Refill:  1    Order Specific Question:   Supervising Provider    Answer:   Samantha Sandoval [[id number]]

## 2018-02-21 ENCOUNTER — Emergency Department (HOSPITAL_COMMUNITY)
Admission: EM | Admit: 2018-02-21 | Discharge: 2018-02-21 | Disposition: A | Discharge status: Home / Self Care | Payer: BLUE CROSS/BLUE SHIELD | Attending: Emergency Medicine | Admitting: Emergency Medicine

## 2018-02-21 ENCOUNTER — Other Ambulatory Visit: Payer: Self-pay

## 2018-02-21 ENCOUNTER — Encounter (HOSPITAL_COMMUNITY): Payer: Self-pay | Admitting: *Deleted

## 2018-02-21 DIAGNOSIS — S8012XD Contusion of left lower leg, subsequent encounter: Secondary | ICD-10-CM | POA: Diagnosis not present

## 2018-02-21 DIAGNOSIS — R202 Paresthesia of skin: Secondary | ICD-10-CM | POA: Insufficient documentation

## 2018-02-21 DIAGNOSIS — W228XXD Striking against or struck by other objects, subsequent encounter: Secondary | ICD-10-CM | POA: Diagnosis not present

## 2018-02-21 DIAGNOSIS — Z87891 Personal history of nicotine dependence: Secondary | ICD-10-CM | POA: Insufficient documentation

## 2018-02-21 DIAGNOSIS — R600 Localized edema: Secondary | ICD-10-CM | POA: Diagnosis not present

## 2018-02-21 NOTE — ED Notes (Signed)
Ace wrap applied

## 2018-02-21 NOTE — Discharge Instructions (Addendum)
Elevate your leg when possible.  Apply ice packs on and off.  You may alternate with ice and heat after 2-3 more days.  Use the Ace wrap as needed for support, but do not wear continuously or at bedtime.  Call the orthopedic provider listed to arrange a follow-up appointment in 1 week if not improving.

## 2018-02-21 NOTE — ED Triage Notes (Signed)
Pt returns for further evaluation of bruising to left leg, was seen in er on 02/17/2018 after a wood splitter kicked back and hit her leg, denies any fever,,

## 2018-02-22 NOTE — ED Provider Notes (Signed)
[facility] EMERGENCY DEPARTMENT Provider Note   CSN: [id number] Arrival date & time: 02/21/18  2034     History   Chief Complaint Chief Complaint  Patient presents with  . Follow-up    HPI Samantha Sandoval is a 22 y.o. female.  HPI   Samantha Sandoval is a 22 y.o. female who presents to the Emergency Department requesting reevaluation of a contusion of her left lower leg.  She was seen in this ER on 02/17/2018 after a piece of wood from a wood splitter struck her lower leg.  She complains of further bruising and an area of swelling of her leg and pain with palpation and weightbearing.  Swelling has been localized to the medial leg and that same area "feels tingly"  She has not been applying ice or elevating her leg as instructed.  She denies numbness, redness, fever, chills, drainage or open wounds.  She states she became concerned due to noticing bruising that extended down her leg and into her medial foot.   Past Medical History:  Diagnosis Date  . Anxiety   . Vaginal Pap smear, abnormal     Patient Active Problem List   Diagnosis Date Noted  . Yeast infection 02/20/2018  . Itching in the vaginal area 02/20/2018  . Vaginal discharge 02/20/2018  . Encounter for elective induction of labor 01/22/2018  . Supervision of normal pregnancy 06/19/2017  . H/O laser treatment of cervix complicating pregnancy 06/19/2017  . 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 Family history of ovarian cancer 10/06/2015    Past Surgical History:  Procedure Laterality Date  . ABDOMINAL SURGERY    . CERVICAL ABLATION N/A 03/28/2017   Procedure: Laser Ablation of Cervix;  Surgeon: Lazaro Arms, MD;  Location: [facility];  Service: Gynecology;  Laterality: N/A;  . tubes in ears    . tumor 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       OB History    Gravida  3   Para  2   Term  2   Preterm      AB  1   Living  2     SAB  1   TAB      Ectopic      Multiple  0   Live Births  2            Home Medications    Prior to Admission medications   Medication Sig Start Date End Date Taking? Authorizing Provider  fluconazole (DIFLUCAN) 150 MG tablet Take 1 now and repeat 1 in 3 days 02/20/18   Adline Potter, NP    Family History Family History  Problem Relation Age of Onset  . Diabetes Maternal Grandmother   . Hypertension Maternal Grandmother   . COPD Maternal Grandmother   . Scoliosis Maternal Grandmother   . Heart disease Maternal Grandmother   . Hypertension Maternal Grandfather   . Other Maternal Grandfather        2 stents in heart  . Heart attack Paternal Grandfather   . Congestive Heart Failure Neg Hx     Social History Social History   Tobacco Use  . Smoking status: Former Smoker    Packs/day: 0.25    Types: Cigarettes  . Smokeless tobacco: Never Used  Substance Use Topics  . Alcohol use: No    Comment: none  . Drug use: No    Comment: denies     Allergies   Hibiclens [chlorhexidine]  Review of Systems Review of Systems  Constitutional: Negative for chills and fever.  Gastrointestinal: Negative for nausea and vomiting.  Musculoskeletal: Positive for myalgias (tenderness and bruising of the left lower leg). Negative for arthralgias and joint swelling.  Skin: Positive for color change. Negative for wound.  Neurological: Negative for dizziness, weakness and numbness.     Physical Exam Updated Vital Signs BP 123/65   Pulse 81   Temp 98.1 F (36.7 C) (Oral)   Resp 20   Ht 5\' 3"  (1.6 m)   Wt 94.8 kg   SpO2 100%   BMI 37.02 kg/m   Physical Exam Vitals signs and nursing note reviewed.  Constitutional:      General: She is not in acute distress.    Appearance: Normal appearance. She is not ill-appearing or toxic-appearing.  HENT:     Head: Atraumatic.  Neck:     Musculoskeletal: Normal range of motion.  Cardiovascular:     Rate and Rhythm: Normal rate and regular rhythm.      Pulses: Normal pulses.     Comments: Dorsalis pedis and posterior tibial pulses are strong and palpable bilaterally Pulmonary:     Effort: Pulmonary effort is normal.     Breath sounds: Normal breath sounds.  Musculoskeletal: Normal range of motion.        General: Tenderness and signs of injury present.     Comments: Focal, 5 cm area of tenderness and mild edema noted to the medial aspect of the upper left lower leg.  This does not extend into the knee joint.  There is diffuse ecchymosis medially extending from the lower leg down to the medial foot.  Compartments are soft.  No tenderness of the posterior lower leg.  Left ankle and foot are nontender.  No erythema or excessive warmth  Skin:    General: Skin is warm.  Neurological:     General: No focal deficit present.     Mental Status: She is alert. Mental status is at baseline.     Sensory: No sensory deficit.     Motor: No weakness.  Psychiatric:        Mood and Affect: Mood normal.      ED Treatments / Results  Labs (all labs ordered are listed, but only abnormal results are displayed) Labs Reviewed - No data to display  EKG None  Radiology No results found.  Procedures Procedures (including critical care time)  Medications Ordered in ED Medications - No data to display   Initial Impression / Assessment and Plan / ED Course  I have reviewed the triage vital signs and the nursing notes.  Pertinent labs & imaging results that were available during my care of the patient were reviewed by me and considered in my medical decision making (see chart for details).     Patient returns with contusion of left lower leg.  There is bruising present medially and likely hematoma.  Extremity is neurovascularly intact and compartments are soft.  Patient advised to try RICE therapy.  Ace wrap applied for compression.  Wear instructions were discussed.  She agrees to treatment plan and ibuprofen if needed for pain.  She is ambulatory  with a steady gait.  Return precautions discussed  Final Clinical Impressions(s) / ED Diagnoses   Final diagnoses:  Contusion of left lower leg, subsequent encounter    ED Discharge Orders    None       Pauline Ausriplett, Elaine Ro[place]horse, PA-C 02/22/18 2353    Gerhard MunchLockwood, Robert,  MD 02/24/18 0020

## 2018-03-04 ENCOUNTER — Encounter: Payer: Self-pay | Admitting: Women's Health

## 2018-03-04 ENCOUNTER — Telehealth: Payer: Self-pay | Admitting: Women's Health

## 2018-03-04 ENCOUNTER — Ambulatory Visit (INDEPENDENT_AMBULATORY_CARE_PROVIDER_SITE_OTHER): Payer: BLUE CROSS/BLUE SHIELD | Admitting: Women's Health

## 2018-03-04 NOTE — Progress Notes (Signed)
   POSTPARTUM VISIT Patient name: Samantha Sandoval MRN [id number]  Date of birth: 03/13/1995 Chief Complaint:   Postpartum Care (12-31 nexplanon insertion)  History of Present Illness:   Samantha PonsRachel Thomure is a 22 y.o. [id number] Caucasian female being seen today for a postpartum visit. She is 5 weeks postpartum following a spontaneous vaginal delivery at 39.5 gestational weeks after elective IOL. Anesthesia: epidural. Laceration: none. I have fully reviewed the prenatal and intrapartum course. Pregnancy uncomplicated. Postpartum course has been uncomplicated. Bleeding no bleeding. Bowel function is normal. Bladder function is normal.  Patient is sexually active. Last sexual activity: 3wks ago.  Contraception method is undecided, discussed all options, wants Liletta IUD.  [place] Postpartum Depression Screening: negative. Score 1.   Last pap 10/06/15.  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  No LMP recorded.  Baby's course has been uncomplicated. Baby is feeding by bottle.  Review of Systems:   Pertinent items are noted in HPI Denies Abnormal vaginal discharge w/ itching/odor/irritation, headaches, visual changes, shortness of breath, chest pain, abdominal pain, severe nausea/vomiting, or problems with urination or bowel movements. Pertinent History Reviewed:  Reviewed past medical,surgical, obstetrical and family history.  Reviewed problem list, medications and allergies. OB History  Gravida Para Term Preterm AB Living  3 2 2   1 2   SAB TAB Ectopic Multiple Live Births  1     0 2    # Outcome Date GA Lbr Len/2nd Weight Sex Delivery Anes PTL Lv  3 Term 01/22/18 3637w5d 03:29 / 00:10 7 lb 11 oz (3.487 kg) F Vag-Spont EPI  LIV     Birth Comments: WNL  2 SAB 02/26/17          1 Term 05/16/16 2450w1d 18:10 / 00:28 7 lb 5.5 oz (3.33 kg) M Vag-Spont EPI N LIV   Physical Assessment:   Vitals:   03/04/18 1136  BP: 123/83  Pulse: 80  Weight: 209 lb (94.8 kg)  Height: 5\' 1"  (1.549 m)  Body  mass index is 39.49 kg/m.       Physical Examination:   General appearance: alert, well appearing, and in no distress  Mental status: alert, oriented to person, place, and time  Skin: warm & dry   Cardiovascular: normal heart rate noted   Respiratory: normal respiratory effort, no distress   Breasts: deferred, no complaints   Abdomen: soft, non-tender   Pelvic: VULVA: normal appearing vulva with no masses, tenderness or lesions, UTERUS: uterus is normal size, shape, consistency and nontender  Rectal: no hemorrhoids  Extremities: no edema       No results found for this or any previous visit (from the past 24 hour(s)).  Assessment & Plan:  1) Postpartum exam 2) 5 wks s/p SVB 3) Bottlefeeding 4) Depression screening 5) Contraception counseling, pt prefers Liletta IUD, abstinence until after insertion  Meds: No orders of the defined types were placed in this encounter.   Follow-up: Return for As scheduled tomorrow (switch to IUD instead of nexplanon) then 4wk later for pap & physical.   No orders of the defined types were placed in this encounter.   Cheral MarkerKimberly R Toney Lizaola CNM, [facility]NP-BC 03/04/2018 12:44 PM

## 2018-03-04 NOTE — Patient Instructions (Signed)
NO SEX UNTIL AFTER YOU GET YOUR BIRTH CONTROL  

## 2018-03-04 NOTE — Telephone Encounter (Signed)
Pt was here earlier, scheduled for IUD but wants to be put on the pill, if possible [facility]

## 2018-03-05 ENCOUNTER — Encounter: Payer: Self-pay | Admitting: Advanced Practice Midwife

## 2018-03-05 ENCOUNTER — Other Ambulatory Visit: Payer: Self-pay

## 2018-03-05 ENCOUNTER — Ambulatory Visit (INDEPENDENT_AMBULATORY_CARE_PROVIDER_SITE_OTHER): Payer: BLUE CROSS/BLUE SHIELD | Admitting: Advanced Practice Midwife

## 2018-03-05 VITALS — BP 107/60 | HR 69 | Ht 61.0 in | Wt 209.0 lb

## 2018-03-05 DIAGNOSIS — Z3202 Encounter for pregnancy test, result negative: Secondary | ICD-10-CM

## 2018-03-05 DIAGNOSIS — Z30430 Encounter for insertion of intrauterine contraceptive device: Secondary | ICD-10-CM

## 2018-03-05 LAB — POCT URINE PREGNANCY: Preg Test, Ur: NEGATIVE

## 2018-03-05 MED ORDER — LEVONORGESTREL 19.5 MCG/DAY IU IUD
INTRAUTERINE_SYSTEM | Freq: Once | INTRAUTERINE | Status: AC
  Administered 2018-03-05: 11:00:00 via INTRAUTERINE

## 2018-03-05 NOTE — Progress Notes (Signed)
Shawnie PonsRachel Slawson is a 22 y.o. yer old  female   who presents for placement of a lILETTA IUD.Last intercourse was 10 days ago, used comdom. and her pregnancy test today is negative.    The risks and benefits of the method and placement have been thouroughly reviewed with the patient and all questions were answered.  Specifically the patient is aware of failure rate of 03/998, expulsion of the IUD and of possible perforation.  The patient is aware of irregular bleeding due to the method and understands the incidence of irregular bleeding diminishes with time.  Time out was performed.  A Graves speculum was placed.  The cervix was prepped using Betadine. The uterus was found to be neutral and it sounded to 9 cm.  The cervix was grasped with a tenaculum and the IUD was inserted to 9 cm.  It was pulled back 1 cm and the IUD was disengaged.  The strings were trimmed to 3 cm.  Sonogram was performed and the proper placement of the IUD was verified.  The patient was instructed on signs and symptoms of infection and to check for the strings after each menses or each month.  The patient is to refrain from intercourse for 3 days.  The patient is scheduled for a return appointment after her first menses or 4 weeks.  Scarlette CalicoFrances Cresenzo-Dishmon 03/05/2018 11:00 AM

## 2018-03-08 ENCOUNTER — Other Ambulatory Visit: Payer: Self-pay

## 2018-03-08 ENCOUNTER — Encounter (HOSPITAL_COMMUNITY): Payer: Self-pay | Admitting: Emergency Medicine

## 2018-03-08 ENCOUNTER — Emergency Department (HOSPITAL_COMMUNITY)
Admission: EM | Admit: 2018-03-08 | Discharge: 2018-03-09 | Disposition: A | Discharge status: Home / Self Care | Payer: BLUE CROSS/BLUE SHIELD | Attending: Emergency Medicine | Admitting: Emergency Medicine

## 2018-03-08 DIAGNOSIS — Z87891 Personal history of nicotine dependence: Secondary | ICD-10-CM | POA: Insufficient documentation

## 2018-03-08 DIAGNOSIS — J029 Acute pharyngitis, unspecified: Secondary | ICD-10-CM | POA: Diagnosis not present

## 2018-03-08 NOTE — ED Triage Notes (Signed)
Pt c/o temp (100.5) at home and sore throat starting today, pt reports she last took Ibuprofen 1 hr ago

## 2018-03-09 ENCOUNTER — Inpatient Hospital Stay (HOSPITAL_COMMUNITY)
Admission: AD | Admit: 2018-03-09 | Discharge: 2018-03-09 | Discharge status: Against Medical Advice | Payer: BLUE CROSS/BLUE SHIELD | Source: Ambulatory Visit | Attending: Obstetrics & Gynecology | Admitting: Obstetrics & Gynecology

## 2018-03-09 DIAGNOSIS — R109 Unspecified abdominal pain: Secondary | ICD-10-CM | POA: Insufficient documentation

## 2018-03-09 DIAGNOSIS — J029 Acute pharyngitis, unspecified: Secondary | ICD-10-CM | POA: Diagnosis not present

## 2018-03-09 DIAGNOSIS — Z5329 Procedure and treatment not carried out because of patient's decision for other reasons: Secondary | ICD-10-CM

## 2018-03-09 LAB — GROUP A STREP BY PCR: Group A Strep by PCR: NOT DETECTED

## 2018-03-09 MED ORDER — IBUPROFEN 600 MG PO TABS: 600.0000 mg | ORAL_TABLET | Freq: Four times a day (QID) | ORAL | 0 refills | Status: DC | PRN

## 2018-03-09 MED ORDER — MAGIC MOUTHWASH W/LIDOCAINE: 5.0000 mL | Freq: Three times a day (TID) | ORAL | 0 refills | Status: DC | PRN

## 2018-03-09 NOTE — MAU Note (Addendum)
Had IUD placed TUes and having some abd cramping. Son has rhinovirus. Pt has body aches, fever, chills since lunch time. Was seen at [facility] yesterday with sore throat and told was not strep. Did not have flu swab

## 2018-03-09 NOTE — Discharge Instructions (Addendum)
Drink plenty of fluids.  Tylenol every 4 hrs if needed for fever.  Follow-up with your doctor for recheck.  Return here for any worsening symptoms such as persistent fever, vomiting and abdominal pain.

## 2018-03-09 NOTE — MAU Note (Signed)
Pt aware of full lobby and wait time. Decided not to wait and would go home and go to urgent care tomorrow if no better. AMA signed. Advised to take Tylenol/Ibuprofen for fever and aching. Good handwashing discussed.

## 2018-03-09 NOTE — ED Provider Notes (Signed)
[facility] EMERGENCY DEPARTMENT Provider Note   CSN: [id number] Arrival date & time: 03/08/18  2330     History   Chief Complaint Chief Complaint  Patient presents with  . Sore Throat    HPI Samantha Sandoval is a 23 y.o. female.  HPI   Samantha Sandoval is a 23 y.o. female who presents to the Emergency Department complaining of fever and sore throat that began earlier today.  She describes her throat as feeling "scratchy" and associated with fever of 100.5 at home.  Her child was diagnosed with a rhinovirus earlier this week.  She took 600 mg ibuprofen one hour prior to arrival.  She denies cough, chest pain and shortness of breath.  She also complains of mild crampy abdominal pain that is intermittent and states that she had an IUD placed three days ago and was concerned if the fever could be related to an infection.  She denies vaginal bleeding, odor, and discharge.      Past Medical History:  Diagnosis Date  . Anxiety   . Vaginal Pap smear, abnormal     Patient Active Problem List   Diagnosis Date Noted  . Encounter for IUD insertion 03/05/2018  . Yeast infection 02/20/2018  . H/O laser treatment of cervix complicating pregnancy 06/19/2017  . 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 Family history of ovarian cancer 10/06/2015    Past Surgical History:  Procedure Laterality Date  . ABDOMINAL SURGERY    . CERVICAL ABLATION N/A 03/28/2017   Procedure: Laser Ablation of Cervix;  Surgeon: Lazaro Arms, MD;  Location: [facility];  Service: Gynecology;  Laterality: N/A;  . tubes in ears    . tumor 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       OB History    Gravida  3   Para  2   Term  2   Preterm      AB  1   Living  2     SAB  1   TAB      Ectopic      Multiple  0   Live Births  2            Home Medications    Prior to Admission medications   Not on File    Family History Family History  Problem Relation Age of  Onset  . Diabetes Maternal Grandmother   . Hypertension Maternal Grandmother   . COPD Maternal Grandmother   . Scoliosis Maternal Grandmother   . Heart disease Maternal Grandmother   . Hypertension Maternal Grandfather   . Other Maternal Grandfather        2 stents in heart  . Heart attack Paternal Grandfather   . Congestive Heart Failure Neg Hx     Social History Social History   Tobacco Use  . Smoking status: Former Smoker    Packs/day: 0.25    Types: Cigarettes  . Smokeless tobacco: Never Used  Substance Use Topics  . Alcohol use: No    Comment: none  . Drug use: No    Comment: denies     Allergies   Hibiclens [chlorhexidine]   Review of Systems Review of Systems  Constitutional: Positive for fever. Negative for activity change, appetite change and chills.  HENT: Positive for sore throat.   Respiratory: Negative for chest tightness and shortness of breath.   Cardiovascular: Negative for chest pain.  Gastrointestinal: Positive for abdominal pain (intermittent cramping abdominal pain). Negative  for diarrhea, nausea and vomiting.  Genitourinary: Negative for difficulty urinating, dysuria, pelvic pain, vaginal bleeding and vaginal pain.  Musculoskeletal: Negative for myalgias.  Neurological: Negative for dizziness, syncope and weakness.     Physical Exam Updated Vital Signs BP 132/82 (BP Location: Left Arm)   Pulse 93   Temp 98.2 F (36.8 C) (Oral)   Resp 19   Ht 5\' 1"  (1.549 m)   Wt 86.2 kg   SpO2 100%   BMI 35.90 kg/m   Physical Exam Vitals signs and nursing note reviewed.  Constitutional:      General: She is not in acute distress.    Appearance: She is well-developed. She is not ill-appearing or toxic-appearing.  HENT:     Head: Atraumatic.     Right Ear: Tympanic membrane and ear canal normal.     Left Ear: Tympanic membrane and ear canal normal.     Nose: Congestion present. No rhinorrhea.     Mouth/Throat:     Mouth: Mucous membranes are  moist.     Pharynx: Uvula midline. Posterior oropharyngeal erythema present. No pharyngeal swelling, oropharyngeal exudate or uvula swelling.  Neck:     Musculoskeletal: Normal range of motion.  Cardiovascular:     Rate and Rhythm: Normal rate and regular rhythm.     Pulses: Normal pulses.  Pulmonary:     Effort: Pulmonary effort is normal. No respiratory distress.     Breath sounds: Normal breath sounds.  Abdominal:     General: There is no distension.     Palpations: Abdomen is soft. There is no mass.     Tenderness: There is no abdominal tenderness. There is no guarding.  Musculoskeletal: Normal range of motion.  Lymphadenopathy:     Cervical: No cervical adenopathy.  Skin:    General: Skin is warm.     Capillary Refill: Capillary refill takes less than 2 seconds.  Neurological:     General: No focal deficit present.     Mental Status: She is alert.     Sensory: No sensory deficit.  Psychiatric:        Mood and Affect: Mood normal.      ED Treatments / Results  Labs (all labs ordered are listed, but only abnormal results are displayed) Labs Reviewed  GROUP A STREP BY PCR    EKG None  Radiology No results found.  Procedures Procedures (including critical care time)  Medications Ordered in ED Medications - No data to display   Initial Impression / Assessment and Plan / ED Course  I have reviewed the triage vital signs and the nursing notes.  Pertinent labs & imaging results that were available during my care of the patient were reviewed by me and considered in my medical decision making (see chart for details).     Pt is well appearing, airway is patent and w/o exudates or edema.  Strep testing is neg. Vitals reassuring.  She is concerned about her low grade fever at home being related to the recent IUD placement.  Her abdomen is soft and without significant tenderness on my exam.  No reported vaginal bleeding or discharge.  I feel that her reported fever is  likely viral.  She does have an upcoming recheck with her OB/GYN. I have discussed strict return precautions and she agrees to plan  Final Clinical Impressions(s) / ED Diagnoses   Final diagnoses:  Acute pharyngitis, unspecified etiology    ED Discharge Orders    None  Pauline Ausriplett, Labrenda Lasky, PA-C 03/09/18 0100    Glynn Octaveancour, Stephen, MD 03/09/18 [phone]

## 2018-03-17 ENCOUNTER — Encounter (HOSPITAL_COMMUNITY): Payer: Self-pay | Admitting: *Deleted

## 2018-03-17 ENCOUNTER — Inpatient Hospital Stay (HOSPITAL_COMMUNITY)
Admission: AD | Admit: 2018-03-17 | Discharge: 2018-03-17 | Disposition: A | Discharge status: Home / Self Care | Payer: BLUE CROSS/BLUE SHIELD | Source: Ambulatory Visit | Attending: Family Medicine | Admitting: Family Medicine

## 2018-03-17 DIAGNOSIS — Z975 Presence of (intrauterine) contraceptive device: Secondary | ICD-10-CM | POA: Diagnosis not present

## 2018-03-17 DIAGNOSIS — N930 Postcoital and contact bleeding: Secondary | ICD-10-CM | POA: Diagnosis not present

## 2018-03-17 DIAGNOSIS — Z87891 Personal history of nicotine dependence: Secondary | ICD-10-CM | POA: Diagnosis not present

## 2018-03-17 LAB — URINALYSIS, ROUTINE W REFLEX MICROSCOPIC
Bilirubin Urine: NEGATIVE
Glucose, UA: NEGATIVE mg/dL
Hgb urine dipstick: NEGATIVE
Ketones, ur: NEGATIVE mg/dL
Leukocytes, UA: NEGATIVE
Nitrite: NEGATIVE
Protein, ur: NEGATIVE mg/dL
Specific Gravity, Urine: 1.014 (ref 1.005–1.030)
pH: 5 (ref 5.0–8.0)

## 2018-03-17 LAB — WET PREP, GENITAL
Clue Cells Wet Prep HPF POC: NONE SEEN
Sperm: NONE SEEN
Trich, Wet Prep: NONE SEEN
Yeast Wet Prep HPF POC: NONE SEEN

## 2018-03-17 NOTE — MAU Note (Signed)
Having pain in lower abd -more on L that goes around to L back. Had intercourse last night and spouse could feel ? IUD. Pt has svd 01/22/18. Had IUD placed 03/04/18 and started bleeding 03/08/18 -unsure if period or not. Has not had period since svd.

## 2018-03-17 NOTE — MAU Provider Note (Signed)
History     CSN: [id number]  Arrival date and time: 03/17/18 0410   First Provider Initiated Contact with Patient 03/17/18 0441      Chief Complaint  Patient presents with  . Back Pain  . Pelvic Pain   HPI Samantha Sandoval is a 23 y.o. [id number] patient who presents to MAU with chief complaint of postcoital spotting and evaluation of IUD. Patient states she had her IUD placed at [facility] on 03/04/18. She and her husband had intercourse last night and she experienced new onset spotting. Her partner also said he could feel the strings. When the patient was unable to feel the strings herself and also experienced abdominal cramping shortly afterwards, she became concerned that her IUD was malpositioned.   OB History    Gravida  3   Para  2   Term  2   Preterm      AB  1   Living  2     SAB  1   TAB      Ectopic      Multiple  0   Live Births  2         /  Past Medical History:  Diagnosis Date  . Anxiety   . Vaginal Pap smear, abnormal     Past Surgical History:  Procedure Laterality Date  . ABDOMINAL SURGERY    . CERVICAL ABLATION N/A 03/28/2017   Procedure: Laser Ablation of Cervix;  Surgeon: Lazaro Arms, MD;  Location: [facility];  Service: Gynecology;  Laterality: N/A;  . tubes in ears    . 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 Diabetes Maternal Grandmother   . Hypertension Maternal Grandmother   . COPD Maternal Grandmother   . Scoliosis Maternal Grandmother   . Heart disease Maternal Grandmother   . Hypertension Maternal Grandfather   . Other Maternal Grandfather        2 stents in heart  . Heart attack Paternal Grandfather   . Congestive Heart Failure Neg Hx     Social History   Tobacco Use  . Smoking status: Former Smoker    Packs/day: 0.25    Types: Cigarettes  . Smokeless tobacco: Never Used  Substance Use Topics  . Alcohol use: No    Comment: none  . Drug use: No    Comment: denies     Allergies:  Allergies  Allergen Reactions  . Hibiclens [Chlorhexidine] Hives    Pt states that her skin burns.    No medications prior to admission.    Review of Systems  Constitutional: Negative for chills, fatigue and fever.  Respiratory: Negative for shortness of breath.   Gastrointestinal: Negative for abdominal pain.  Genitourinary: Positive for vaginal bleeding. Negative for difficulty urinating.  Musculoskeletal: Negative for back pain.  Neurological: Negative for headaches.  All other systems reviewed and are negative.  Physical Exam   Blood pressure 106/63, pulse 93, temperature 98.2 F (36.8 C), resp. rate 18, height 5\' 1"  (1.549 m), weight 94.3 kg, not currently breastfeeding.  Physical Exam  Nursing note and vitals reviewed. Constitutional: She is oriented to person, place, and time. She appears well-developed and well-nourished.  Cardiovascular: Normal rate.  Respiratory: Effort normal.  Genitourinary:    Vagina and uterus normal.     No vaginal discharge.     Genitourinary Comments: No active bleeding observed, pink-tinged discharge proximal to cervical os   Musculoskeletal:  Normal range of motion.  Neurological: She is alert and oriented to person, place, and time.  Skin: Skin is warm and dry.  Psychiatric: She has a normal mood and affect. Her behavior is normal. Judgment and thought content normal.   IUD strings visualized  MAU Course/MDM   --Explained to patient that since IUD is positioned appropriately it will not be removed in MAU --Reviewed expected spotting with IUD for first 6-8 month Patient Vitals for the past 24 hrs:  BP Temp Pulse Resp Height Weight  03/17/18 0427 106/63 - 93 - - -  03/17/18 0424 - 98.2 F (36.8 C) - 18 5\' 1"  (1.549 m) 94.3 kg     Assessment and Plan  --23 y.o. [id number]  --IUD in place and positioned correctly --Postcoital spotting --Discharge home in stable condition  Calvert Cantor, CNM 03/17/2018, 5:28  AM

## 2018-03-17 NOTE — Progress Notes (Signed)
Written and verbal d/c instructions given and understanding voiced. 

## 2018-03-17 NOTE — Discharge Instructions (Signed)

## 2018-03-18 LAB — GC/CHLAMYDIA PROBE AMP (~~LOC~~) NOT AT ARMC
Chlamydia: NEGATIVE
Neisseria Gonorrhea: NEGATIVE

## 2018-03-19 ENCOUNTER — Telehealth: Payer: Self-pay | Admitting: Advanced Practice Midwife

## 2018-03-19 NOTE — Telephone Encounter (Signed)
Patient called, stated she is passing clots with the IUD and having some pain.  [phone]

## 2018-03-19 NOTE — Telephone Encounter (Signed)
Attempted to return pts call. Unable to leave message; mailbox full

## 2018-03-22 ENCOUNTER — Ambulatory Visit (INDEPENDENT_AMBULATORY_CARE_PROVIDER_SITE_OTHER): Payer: BLUE CROSS/BLUE SHIELD | Admitting: Adult Health

## 2018-03-22 ENCOUNTER — Encounter: Payer: Self-pay | Admitting: Adult Health

## 2018-03-22 VITALS — BP 113/66 | HR 70 | Ht 61.0 in | Wt 209.0 lb

## 2018-03-22 DIAGNOSIS — N939 Abnormal uterine and vaginal bleeding, unspecified: Secondary | ICD-10-CM | POA: Insufficient documentation

## 2018-03-22 DIAGNOSIS — Z975 Presence of (intrauterine) contraceptive device: Secondary | ICD-10-CM | POA: Insufficient documentation

## 2018-03-22 DIAGNOSIS — Z3202 Encounter for pregnancy test, result negative: Secondary | ICD-10-CM

## 2018-03-22 DIAGNOSIS — R102 Pelvic and perineal pain: Secondary | ICD-10-CM | POA: Diagnosis not present

## 2018-03-22 LAB — POCT URINE PREGNANCY: Preg Test, Ur: NEGATIVE

## 2018-03-22 MED ORDER — MEGESTROL ACETATE 40 MG PO TABS: ORAL_TABLET | ORAL | 1 refills | Status: DC

## 2018-03-22 NOTE — Progress Notes (Signed)
Patient ID: Samantha Sandoval, female   DOB: [birth year], 23 y.o.   MRN: [id number] History of Present Illness:  Samantha Sandoval is a 23 year old white female in complaining of bleeding since IUD inserted March 11, 2018 and pelvic pain.Has passed clots and tissue.   Current Medications, Allergies, Past Medical History, Past Surgical History, Family History and Social History were reviewed in [employer] record.     Review of Systems: Bleeding with IUD since insertion, heavy at times +pelvic pain Has passed clots and tissue When had sex felt like it hit IUD   Physical Exam:BP 113/66 (BP Location: Left Arm, Patient Position: Sitting, Cuff Size: Normal)   Pulse 70   Ht 5\' 1"  (1.549 m)   Wt 209 lb (94.8 kg)   LMP 03/20/2018   BMI 39.49 kg/m   UPT negative. General:  Well developed, well nourished, no acute distress Skin:  Warm and dry Pelvic:  External genitalia is normal in appearance, no lesions.  The vagina is normal in appearance. Urethra has no lesions or masses. The cervix is bulbous.+IUD strings, no CMT.Scant blood at os.  Uterus is felt to be normal size, shape, and contour.  No adnexal masses or tenderness noted.Bladder is non tender, no masses felt. Psych:  No mood changes, alert and cooperative,seems happy PHQ 2 score 0 Fall risk is low. Examination chaperoned by Marchelle Folks Rash LPN. Will give megace to stop bleeding and get [place] to assess uterus and IUD placement.  Impression: 1. Abnormal uterine bleeding (AUB)   2. Pelvic pain   3. IUD (intrauterine device) in place   4. Negative pregnancy test       Plan: Meds ordered this encounter  Medications  . megestrol (MEGACE) 40 MG tablet    Sig: Take 2 po daily    Dispense:  60 tablet    Refill:  1    Order Specific Question:   Supervising Provider    Answer:   Duane Lope H [[id number]]  Return in 1 week for GYN [place], will talk when results back

## 2018-03-29 ENCOUNTER — Telehealth: Payer: Self-pay | Admitting: Adult Health

## 2018-03-29 ENCOUNTER — Ambulatory Visit (INDEPENDENT_AMBULATORY_CARE_PROVIDER_SITE_OTHER): Payer: BLUE CROSS/BLUE SHIELD

## 2018-03-29 DIAGNOSIS — N939 Abnormal uterine and vaginal bleeding, unspecified: Secondary | ICD-10-CM

## 2018-03-29 DIAGNOSIS — Z975 Presence of (intrauterine) contraceptive device: Secondary | ICD-10-CM

## 2018-03-29 DIAGNOSIS — R102 Pelvic and perineal pain: Secondary | ICD-10-CM | POA: Diagnosis not present

## 2018-03-29 NOTE — Telephone Encounter (Signed)
Mailbox is full.

## 2018-03-29 NOTE — Progress Notes (Signed)
PELVIC US TA/TV: homogeneous anteverted uterus,wnl,EEC 7.2 mm,IUD is centrally located w/in the endometrium,normal ovaries bilat,no free fluid,limited movements of left ovary because of position,right ovary appears mobile

## 2018-03-31 DIAGNOSIS — S2341XA Sprain of ribs, initial encounter: Secondary | ICD-10-CM | POA: Diagnosis not present

## 2018-03-31 DIAGNOSIS — Z888 Allergy status to other drugs, medicaments and biological substances status: Secondary | ICD-10-CM | POA: Diagnosis not present

## 2018-04-03 ENCOUNTER — Other Ambulatory Visit (HOSPITAL_COMMUNITY)
Admission: RE | Admit: 2018-04-03 | Discharge: 2018-04-03 | Disposition: A | Discharge status: Home / Self Care | Payer: BLUE CROSS/BLUE SHIELD | Source: Ambulatory Visit | Attending: Advanced Practice Midwife | Admitting: Advanced Practice Midwife

## 2018-04-03 ENCOUNTER — Ambulatory Visit (INDEPENDENT_AMBULATORY_CARE_PROVIDER_SITE_OTHER): Payer: BLUE CROSS/BLUE SHIELD | Admitting: Advanced Practice Midwife

## 2018-04-03 ENCOUNTER — Encounter: Payer: Self-pay | Admitting: Advanced Practice Midwife

## 2018-04-03 VITALS — BP 115/69 | HR 85 | Ht 61.0 in | Wt 209.0 lb

## 2018-04-03 DIAGNOSIS — Z124 Encounter for screening for malignant neoplasm of cervix: Secondary | ICD-10-CM

## 2018-04-03 DIAGNOSIS — Z01419 Encounter for gynecological examination (general) (routine) without abnormal findings: Secondary | ICD-10-CM | POA: Diagnosis not present

## 2018-04-03 DIAGNOSIS — K029 Dental caries, unspecified: Secondary | ICD-10-CM | POA: Diagnosis not present

## 2018-04-03 NOTE — Progress Notes (Signed)
Samantha Sandoval 23 y.o.  Vitals:   04/03/18 0911  BP: 115/69  Pulse: 85     Filed Weights   04/03/18 0911  Weight: 209 lb (94.8 kg)    Past Medical History: Past Medical History:  Diagnosis Date  . Anxiety   . Vaginal Pap smear, abnormal     Past Surgical History: Past Surgical History:  Procedure Laterality Date  . ABDOMINAL SURGERY    . CERVICAL ABLATION N/A 03/28/2017   Procedure: Laser Ablation of Cervix;  Surgeon: Lazaro Arms, MD;  Location: [facility];  Service: Gynecology;  Laterality: N/A;  . tubes in ears    . tumor 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      Family History: Family History  Problem Relation Age of Onset  . Diabetes Maternal Grandmother   . Hypertension Maternal Grandmother   . COPD Maternal Grandmother   . Scoliosis Maternal Grandmother   . Heart disease Maternal Grandmother   . Hypertension Maternal Grandfather   . Other Maternal Grandfather        2 stents in heart  . Heart attack Paternal Grandfather   . Congestive Heart Failure Neg Hx     Social History: Social History   Tobacco Use  . Smoking status: Former Smoker    Packs/day: 0.25    Types: Cigarettes  . Smokeless tobacco: Never Used  Substance Use Topics  . Alcohol use: No    Comment: none  . Drug use: No    Comment: denies    Allergies:  Allergies  Allergen Reactions  . Hibiclens [Chlorhexidine] Hives    Pt states that her skin burns.      Current Outpatient Medications:  .  AMOXICILLIN PO, Take by mouth 2 (two) times daily., Disp: , Rfl:  .  guaiFENesin (MUCINEX PO), Take by mouth 2 (two) times daily., Disp: , Rfl:  .  Levonorgestrel (LILETTA, 52 MG,) 19.5 MCG/DAY IUD IUD, 1 each by Intrauterine route once., Disp: , Rfl:  .  Pseudoeph-Bromphen-DM (BROMFED DM PO), Take by mouth 4 (four) times daily as needed., Disp: , Rfl:  .  megestrol (MEGACE) 40 MG tablet, Take 2 po daily, Disp: 60 tablet, Rfl: 1  History of Present Illness: here for pap/IUD check. 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  SVD in November, Liletta IUD 03/05/18.  Went to [facility] last week for postcoital bleeding, husband could feel IUD strings. Was informed that all this is normal. Had some LLQ pain as well, no problems found. [place] 1/24 was normal.    Review of Systems   Patient denies any headaches, blurred vision, shortness of breath, chest pain, abdominal pain, problems with bowel movements, urination, or intercourse.   Physical Exam: General:  Well developed, well nourished, no acute distress Skin:  Warm and dry Lungs; normal resp effort Cardiovascular: Regular rate and rhythm Pelvic:  External genitalia is normal in appearance.  The vagina is normal in appearance.  The cervix is bulbous. Strings visible. No blood.  Uterus is felt to be normal size, shape, and contour.  No adnexal masses or tenderness noted.  Extremities:  No swelling or varicosities noted    Impression: normal GYN exam IUD placed properly    Plan: if pap normal, need 2 more yearlies.  May use megace prn for BTB      '

## 2018-04-08 LAB — CYTOLOGY - PAP
Chlamydia: NEGATIVE
Diagnosis: NEGATIVE
HPV: NOT DETECTED
Neisseria Gonorrhea: NEGATIVE

## 2018-05-07 ENCOUNTER — Ambulatory Visit: Payer: Self-pay | Admitting: Women's Health

## 2018-05-22 DIAGNOSIS — S93401A Sprain of unspecified ligament of right ankle, initial encounter: Secondary | ICD-10-CM | POA: Diagnosis not present

## 2018-05-22 DIAGNOSIS — M25571 Pain in right ankle and joints of right foot: Secondary | ICD-10-CM | POA: Diagnosis not present

## 2018-06-20 DIAGNOSIS — S93401D Sprain of unspecified ligament of right ankle, subsequent encounter: Secondary | ICD-10-CM | POA: Diagnosis not present

## 2018-06-20 NOTE — Telephone Encounter (Signed)
Pt wanting to see if another provider in office can prescribe the anxiety medication in Dr. Forestine Chute absence.

## 2018-06-20 NOTE — Telephone Encounter (Signed)
Pt wanting an appt to have IUD removed.

## 2018-06-25 ENCOUNTER — Telehealth: Payer: Self-pay | Admitting: *Deleted

## 2018-06-25 ENCOUNTER — Other Ambulatory Visit: Payer: Self-pay | Admitting: Obstetrics & Gynecology

## 2018-06-25 MED ORDER — ZOLPIDEM TARTRATE 10 MG PO TABS: 10.0000 mg | ORAL_TABLET | Freq: Every evening | ORAL | 0 refills | Status: DC | PRN

## 2018-06-25 NOTE — Telephone Encounter (Signed)
Spoke with pt. Pt got IUD placed in December. Pt passing blood clots. Also, having anxiety issues, thinks IUD is making it worse. Pt wants to have IUD removed. Call transferred to front desk for appt. JSY

## 2018-06-28 ENCOUNTER — Telehealth: Payer: Self-pay | Admitting: Women's Health

## 2018-06-28 NOTE — Telephone Encounter (Signed)
Patient called stating that she would like a call back from the nurse. Pt did not state the reason why. Please contact pt °

## 2018-06-28 NOTE — Telephone Encounter (Signed)
Patient returned call stated that she is having a yellowish mucus discharge this morning. No itching, burning, irritation or odor. No new sexual partners. Advised patient likely normal discharge and if it worsens or is bothering her Monday morning to call us for appt. Patient agreeable and has no other questions at this time.

## 2018-06-28 NOTE — Telephone Encounter (Signed)
Called patient back and left message that I am returning her call.  

## 2018-07-03 ENCOUNTER — Encounter: Payer: Self-pay | Admitting: *Deleted

## 2018-07-04 ENCOUNTER — Encounter: Payer: Self-pay | Admitting: Women's Health

## 2018-07-04 ENCOUNTER — Ambulatory Visit (INDEPENDENT_AMBULATORY_CARE_PROVIDER_SITE_OTHER): Payer: BLUE CROSS/BLUE SHIELD | Admitting: Women's Health

## 2018-07-04 ENCOUNTER — Other Ambulatory Visit: Payer: Self-pay

## 2018-07-04 VITALS — BP 115/74 | HR 84 | Ht 63.0 in | Wt 227.2 lb

## 2018-07-04 DIAGNOSIS — Z30432 Encounter for removal of intrauterine contraceptive device: Secondary | ICD-10-CM | POA: Diagnosis not present

## 2018-07-04 DIAGNOSIS — F419 Anxiety disorder, unspecified: Secondary | ICD-10-CM | POA: Insufficient documentation

## 2018-07-04 MED ORDER — DOXYCYCLINE HYCLATE 100 MG PO CAPS: 100.0000 mg | ORAL_CAPSULE | Freq: Two times a day (BID) | ORAL | 0 refills | Status: DC

## 2018-07-04 MED ORDER — ESCITALOPRAM OXALATE 10 MG PO TABS: 10.0000 mg | ORAL_TABLET | Freq: Every day | ORAL | 6 refills | Status: DC

## 2018-07-04 NOTE — Progress Notes (Signed)
   IUD REMOVAL  Patient name: Samantha Sandoval MRN [id number]  Date of birth: 1995-08-26 Subjective Findings:   Samantha Sandoval is a 23 y.o. G94P2012 Caucasian female being seen today for removal of a Liletta IUD. Her IUD was placed 03/05/18.  She desires removal because of weight gain, blood clots, increased anxiety. Requests rx for Lexapro, has been on it before and helped w/ her anxiety. Denies SI/HI/II. Requests weight loss meds. Signed copy of informed consent in chart.   No LMP recorded. (Menstrual status: IUD). Last pap 04/03/18. Results were:  normal The planned method of family planning is condoms  And then BTL after COVID Pertinent History Reviewed:   Reviewed past medical,surgical, social, obstetrical and family history.  Reviewed problem list, medications and allergies. Objective Findings & Procedure:    Vitals:   07/04/18 1123  BP: 115/74  Pulse: 84  Weight: 227 lb 3.2 oz (103.1 kg)  Height: 5\' 3"  (1.6 m)  Body mass index is 40.25 kg/m.  No results found for this or any previous visit (from the past 24 hour(s)).   Time out was performed.  A graves speculum was placed in the vagina.  The cervix was visualized, and the strings WERE NOT visible. Informal transvaginal u/s confirms IUD w/in uterus. Curved forceps attempted w/o success, cyto brush, uterine packing forceps, then curved forceps again- and IUD finally grasped and removed. The patient tolerated the procedure well.  Assessment & Plan:   1) Liletta IUD removal> rx doxycycline d/t difficult removal/uterine manipulation  2) Anxiety> rx Lexapro 10mg , f/u 4wks w/ JAG to discuss weight loss meds   No orders of the defined types were placed in this encounter.   Follow-up: Return in about 4 weeks (around 08/01/2018) for F/U webex w/ JAG.  Cheral Marker CNM, [facility] 07/04/2018 12:36 PM

## 2018-07-07 DIAGNOSIS — N939 Abnormal uterine and vaginal bleeding, unspecified: Secondary | ICD-10-CM | POA: Diagnosis not present

## 2018-07-10 ENCOUNTER — Ambulatory Visit (INDEPENDENT_AMBULATORY_CARE_PROVIDER_SITE_OTHER): Payer: BLUE CROSS/BLUE SHIELD | Admitting: Adult Health

## 2018-07-10 ENCOUNTER — Other Ambulatory Visit: Payer: Self-pay

## 2018-07-10 ENCOUNTER — Encounter: Payer: Self-pay | Admitting: Adult Health

## 2018-07-10 DIAGNOSIS — Z713 Dietary counseling and surveillance: Secondary | ICD-10-CM | POA: Diagnosis not present

## 2018-07-10 DIAGNOSIS — Z6841 Body Mass Index (BMI) 40.0 and over, adult: Secondary | ICD-10-CM | POA: Diagnosis not present

## 2018-07-10 MED ORDER — PHENTERMINE HCL 37.5 MG PO TABS: 37.5000 mg | ORAL_TABLET | Freq: Every day | ORAL | 0 refills | Status: DC

## 2018-07-10 NOTE — Progress Notes (Signed)
Patient ID: Samantha Sandoval, female   DOB: 1995/04/26, 23 y.o.   MRN: [id number]   TELEHEALTH VIRTUAL GYNECOLOGY VISIT ENCOUNTER NOTE  I connected with Samantha Sandoval on 07/10/18 at  2:45 PM EDT by telephone at home and verified that I am speaking with the correct person using two identifiers.   I discussed the limitations, risks, security and privacy concerns of performing an evaluation and management service by telephone and the availability of in person appointments. I also discussed with the patient that there may be a patient responsible charge related to this service. The patient expressed understanding and agreed to proceed.   History:  Samantha Sandoval is a 23 y.o. [phone] female being evaluated today for weight loss aide. She weighs 227 and wants to lose weight, she just had IUD removed, hoping that would help too.  She denies any abnormal vaginal discharge, bleeding, pelvic pain or other concerns.       Past Medical History:  Diagnosis Date  . Anxiety   . Vaginal Pap smear, abnormal    Past Surgical History:  Procedure Laterality Date  . ABDOMINAL SURGERY    . CERVICAL ABLATION N/A 03/28/2017   Procedure: Laser Ablation of Cervix;  Surgeon: Lazaro Arms, MD;  Location: [facility];  Service: Gynecology;  Laterality: N/A;  . tubes in ears    . tumor 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     The following portions of the patient's history were reviewed and updated as appropriate: allergies, current medications, past family history, past medical history, past social history, past surgical history and problem list.   Health Maintenance:  Normal pap 04/03/2018  Review of Systems:  Pertinent items noted in HPI and remainder of comprehensive ROS otherwise negative.  Physical Exam:   General:  Alert, oriented and cooperative.   Mental Status: Normal mood and affect perceived. Normal judgment and thought content.  Physical exam deferred due to nature of the encounter Wt 227 lbs and BP 115/74 at last visit.    Labs and Imaging No results found for this or any previous visit (from the past 336 hour(s)). No results found.    Assessment and Plan:     1. Weight loss counseling, encounter for -discussed keeping food diary  -increase water -walk  Will rx adipex. Meds ordered this encounter  Medications  . phentermine (ADIPEX-P) 37.5 MG tablet    Sig: Take 1 tablet (37.5 mg total) by mouth daily before breakfast.    Dispense:  30 tablet    Refill:  0    Order Specific Question:   Supervising Provider    Answer:   Duane Lope H [[id number]]  Follow up in 4 weeks for weight and BP check   2. Body mass index 40.0-44.9, adult (HCC)        I discussed the assessment and treatment plan with the patient. The patient was provided an opportunity to ask questions and all were answered. The patient agreed with the plan and demonstrated an understanding of the instructions.   The patient was advised to call back or seek an in-person evaluation/go to the ED if the symptoms worsen or if the condition fails to improve as anticipated.  I provided 10 minutes of non-face-to-face time during this encounter.   Cyril Mourning, NP Center for [employer], [facility]

## 2018-07-18 ENCOUNTER — Other Ambulatory Visit: Payer: Self-pay

## 2018-07-19 MED ORDER — ZOLPIDEM TARTRATE 10 MG PO TABS: 10.0000 mg | ORAL_TABLET | Freq: Every evening | ORAL | 0 refills | Status: DC | PRN

## 2018-07-31 ENCOUNTER — Telehealth: Payer: Self-pay | Admitting: Adult Health

## 2018-07-31 NOTE — Telephone Encounter (Signed)
Patient called stating that her next appointment that Samantha Sandoval wanted her to come in to check her weight because she was going to place her on a weight loss medication. Please contact pt

## 2018-08-01 ENCOUNTER — Telehealth: Payer: Self-pay | Admitting: Obstetrics & Gynecology

## 2018-08-01 ENCOUNTER — Ambulatory Visit (INDEPENDENT_AMBULATORY_CARE_PROVIDER_SITE_OTHER): Payer: BLUE CROSS/BLUE SHIELD | Admitting: Adult Health

## 2018-08-01 ENCOUNTER — Other Ambulatory Visit: Payer: Self-pay

## 2018-08-01 ENCOUNTER — Encounter: Payer: Self-pay | Admitting: Adult Health

## 2018-08-01 VITALS — BP 107/77 | HR 104 | Temp 98.5°F | Ht 62.5 in | Wt 222.5 lb

## 2018-08-01 DIAGNOSIS — Z6841 Body Mass Index (BMI) 40.0 and over, adult: Secondary | ICD-10-CM

## 2018-08-01 DIAGNOSIS — Z713 Dietary counseling and surveillance: Secondary | ICD-10-CM

## 2018-08-01 DIAGNOSIS — Z789 Other specified health status: Secondary | ICD-10-CM | POA: Diagnosis not present

## 2018-08-01 MED ORDER — PHENTERMINE HCL 37.5 MG PO TABS: 37.5000 mg | ORAL_TABLET | Freq: Every day | ORAL | 0 refills | Status: DC

## 2018-08-01 NOTE — Progress Notes (Signed)
Patient ID: Samantha Sandoval, female   DOB: 1995-08-16, 23 y.o.   MRN: [id number] History of Present Illness: Samantha Sandoval is a 23 year old white female, in for weight and BP check started adipex first of May, and has lost some weight.She thinks she may have forgot to take tampon out, no odor or discharge    Current Medications, Allergies, Past Medical History, Past Surgical History, Family History and Social History were reviewed in [employer] record.     Review of Systems: ? Forgot to take tampon out, no odor or discharge  Feels good, has lost some weight    Physical Exam:BP 107/77 (BP Location: Right Arm, Patient Position: Sitting, Cuff Size: Large)   Pulse (!) 104   Temp 98.5 F (36.9 C)   Ht 5' 2.5" (1.588 m)   Wt 222 lb 8 oz (100.9 kg)   LMP 07/18/2018 (Approximate)   Breastfeeding No   BMI 40.05 kg/m  General:  Well developed, well nourished, no acute distress Skin:  Warm and dry Lungs; Clear to auscultation bilaterally Cardiovascular: Regular rate and rhythm  Pelvic:  External genitalia is normal in appearance, no lesions.  The vagina is normal in appearance, no tampon seen. Urethra has no lesions or masses. The cervix is bulbous.   Psych:  No mood changes, alert and cooperative,seems happy Fall risk is low. Lost 4.5 lbs, will continue adipex Exam performed without chaperone with her permission.    Impression an Plan: 1. Weight loss counseling, encounter for -continue decreasing potions and walking -continue adipex Meds ordered this encounter  Medications  . phentermine (ADIPEX-P) 37.5 MG tablet    Sig: Take 1 tablet (37.5 mg total) by mouth daily before breakfast.    Dispense:  30 tablet    Refill:  0    Order Specific Question:   Supervising Provider    Answer:   Duane Lope H [[id number]]  Follow  Up in 4 weeks   2. Body mass index 40.0-44.9, adult (HCC)  3. Normal vagina, no retained tampon

## 2018-08-02 NOTE — Telephone Encounter (Signed)
Error

## 2018-08-05 ENCOUNTER — Encounter: Payer: Self-pay | Admitting: *Deleted

## 2018-08-06 ENCOUNTER — Other Ambulatory Visit: Payer: Self-pay

## 2018-08-06 ENCOUNTER — Ambulatory Visit (INDEPENDENT_AMBULATORY_CARE_PROVIDER_SITE_OTHER): Payer: BC Managed Care – PPO | Admitting: Obstetrics & Gynecology

## 2018-08-06 ENCOUNTER — Encounter: Payer: Self-pay | Admitting: Obstetrics & Gynecology

## 2018-08-06 DIAGNOSIS — Z3009 Encounter for other general counseling and advice on contraception: Secondary | ICD-10-CM | POA: Diagnosis not present

## 2018-08-06 NOTE — Progress Notes (Signed)
   TELEHEALTH [facility] VIRTUAL GYNECOLOGY VISIT ENCOUNTER NOTE  I connected with Britt Albach on 08/06/18 at 11:00 AM EDT by [facility] at home and verified that I am speaking with the correct person using two identifiers.   I discussed the limitations, risks, security and privacy concerns of performing an evaluation and management service by telephone and the availability of in person appointments. I also discussed with the patient that there may be a patient responsible charge related to this service. The patient expressed understanding and agreed to proceed.   History:  Samantha Sandoval is a 23 y.o. [phone] female being evaluated today for discussion regarding tubl ligation. She denies any abnormal vaginal discharge, bleeding, pelvic pain or other concerns.    pre op tubal, all questions answered  Will have to sign BTL papers   Past Medical History:  Diagnosis Date  . Anxiety   . Vaginal Pap smear, abnormal    Past Surgical History:  Procedure Laterality Date  . ABDOMINAL SURGERY    . CERVICAL ABLATION N/A 03/28/2017   Procedure: Laser Ablation of Cervix;  Surgeon: Lazaro Arms, MD;  Location: [facility];  Service: Gynecology;  Laterality: N/A;  . tubes in ears    . tumor 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     The following portions of the patient's history were reviewed and updated as appropriate: allergies, current medications, past family history, past medical history, past social history, past surgical history and problem list.   Health Maintenance:  Normal pap and negative HRHPV on .  Normal mammogram on .   Review of Systems:  Pertinent items noted in HPI and remainder of comprehensive ROS otherwise negative.  Physical Exam:  Physical exam deferred due to nature of the encounter GEN WDWN NAD  Labs and Imaging No results found for this or any previous visit (from the past 336 hour(s)). No results found.     No orders of the defined types were placed in this encounter.   No orders of the defined  types were placed in this encounter.   Assessment and Plan:     Sterilization consult      I discussed the assessment and treatment plan with the patient. The patient was provided an opportunity to ask questions and all were answered. The patient agreed with the plan and demonstrated an understanding of the instructions.   The patient was advised to call back or seek an in-person evaluation/go to the ED if the symptoms worsen or if the condition fails to improve as anticipated.  I provided 11 minutes of non-face-to-face time during this encounter.   Lazaro Arms, MD Center for [facility]

## 2018-08-29 ENCOUNTER — Ambulatory Visit: Payer: BC Managed Care – PPO | Admitting: Adult Health

## 2018-09-11 ENCOUNTER — Telehealth: Payer: Self-pay | Admitting: Adult Health

## 2018-09-11 NOTE — Telephone Encounter (Signed)
Pt states she missed her appt last week and is almost out of medication. Scheduled pt for first available with Anderson Malta. Pt requesting more medication to be set in.

## 2018-09-11 NOTE — Telephone Encounter (Signed)
Patient states she is needing a refill on her Phentermine.  Informed she will need a visit for a refill.  Appt made for webex tomorrow.

## 2018-09-12 ENCOUNTER — Other Ambulatory Visit: Payer: Self-pay

## 2018-09-12 ENCOUNTER — Encounter: Payer: Self-pay | Admitting: Adult Health

## 2018-09-12 ENCOUNTER — Ambulatory Visit (INDEPENDENT_AMBULATORY_CARE_PROVIDER_SITE_OTHER): Payer: BC Managed Care – PPO | Admitting: Adult Health

## 2018-09-12 VITALS — BP 116/74 | HR 95 | Ht 62.0 in | Wt 218.4 lb

## 2018-09-12 DIAGNOSIS — Z6839 Body mass index (BMI) 39.0-39.9, adult: Secondary | ICD-10-CM

## 2018-09-12 DIAGNOSIS — Z713 Dietary counseling and surveillance: Secondary | ICD-10-CM | POA: Diagnosis not present

## 2018-09-12 MED ORDER — PHENTERMINE HCL 37.5 MG PO TABS: 37.5000 mg | ORAL_TABLET | Freq: Every day | ORAL | 0 refills | Status: DC

## 2018-09-12 NOTE — Progress Notes (Signed)
Patient ID: Samantha Sandoval, female   DOB: Jul 13, 1995, 23 y.o.   MRN: [id number]   TELEHEALTH VIRTUAL GYNECOLOGY VISIT ENCOUNTER NOTE  I connected with Samantha Sandoval on 09/12/18 at 11:30 AM EDT by telephone at home and verified that I am speaking with the correct person using two identifiers.   I discussed the limitations, risks, security and privacy concerns of performing an evaluation and management service by telephone and the availability of in person appointments. I also discussed with the patient that there may be a patient responsible charge related to this service. The patient expressed understanding and agreed to proceed.   History:  Samantha Sandoval is a 23 y.o. [phone] female being evaluated today for weight loss on adipex, and she has lost 4 more lbs.  She denies any trouble sleeping or unusual headaches or other concerns.       Past Medical History:  Diagnosis Date  . Anxiety   . Vaginal Pap smear, abnormal    Past Surgical History:  Procedure Laterality Date  . ABDOMINAL SURGERY    . CERVICAL ABLATION N/A 03/28/2017   Procedure: Laser Ablation of Cervix;  Surgeon: Florian Buff, MD;  Location: [facility];  Service: Gynecology;  Laterality: N/A;  . tubes in ears    . tumor 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     The following portions of the patient's history were reviewed and updated as appropriate: allergies, current medications, past family history, past medical history, past social history, past surgical history and problem list.   Health Maintenance:  Normal pap on 04/03/18  Review of Systems:  Pertinent items noted in HPI and remainder of comprehensive ROS otherwise negative.  Physical Exam:   General:  Alert, oriented and cooperative.   Mental Status: Normal mood and affect perceived. Normal judgment and thought content.  Physical exam deferred due to nature of the encounter BP 116/74 (BP Location: Right Arm, Patient Position: Sitting, Cuff Size: Normal)   Pulse 95   Ht 5\' 2"  (1.575 m)    Wt 218 lb 6.4 oz (99.1 kg)   LMP 08/07/2018   BMI 39.95 kg/m per pt. Has lost 4 lbs and is wanting to continue adipex.  Labs and Imaging No results found for this or any previous visit (from the past 336 hour(s)). No results found.    Assessment and Plan:     1. Weight loss counseling, encounter for Will continue adipex Follow up in 4 weeks for tele visit Meds ordered this encounter  Medications  . phentermine (ADIPEX-P) 37.5 MG tablet    Sig: Take 1 tablet (37.5 mg total) by mouth daily before breakfast.    Dispense:  30 tablet    Refill:  0    Order Specific Question:   Supervising Provider    Answer:   Elonda Husky, LUTHER H [[id number]]    2. Body mass index 39.0-39.9, adult        I discussed the assessment and treatment plan with the patient. The patient was provided an opportunity to ask questions and all were answered. The patient agreed with the plan and demonstrated an understanding of the instructions.   The patient was advised to call back or seek an in-person evaluation/go to the ED if the symptoms worsen or if the condition fails to improve as anticipated.  I provided 5 minutes of non-face-to-face time during this encounter.   Derrek Monaco, NP Center for [employer], [place]

## 2018-09-20 DIAGNOSIS — M25561 Pain in right knee: Secondary | ICD-10-CM | POA: Diagnosis not present

## 2018-10-01 ENCOUNTER — Ambulatory Visit: Payer: BC Managed Care – PPO | Admitting: Adult Health

## 2018-10-04 DIAGNOSIS — M25562 Pain in left knee: Secondary | ICD-10-CM | POA: Diagnosis not present

## 2019-03-08 ENCOUNTER — Emergency Department (HOSPITAL_COMMUNITY)
Admission: EM | Admit: 2019-03-08 | Discharge: 2019-03-08 | Discharge status: Against Medical Advice | Payer: BC Managed Care – PPO

## 2019-03-08 ENCOUNTER — Other Ambulatory Visit: Payer: Self-pay

## 2019-03-08 NOTE — ED Notes (Signed)
Pt came in requesting to be covid tested. States she wants the rapid test done. Advised she may not meet rapid test criteria. Pt got mad and decided to leave.

## 2019-06-03 ENCOUNTER — Encounter: Payer: Self-pay | Admitting: *Deleted

## 2019-06-03 ENCOUNTER — Other Ambulatory Visit: Payer: Self-pay

## 2019-06-09 ENCOUNTER — Telehealth: Payer: BC Managed Care – PPO | Admitting: Adult Health

## 2019-06-18 ENCOUNTER — Telehealth: Payer: Self-pay | Admitting: Adult Health

## 2019-06-18 NOTE — Telephone Encounter (Signed)
Patient called for an appointment per Jennifer's instructions in mychart.  I offered her multiple appointments and she declined.  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  She will call back.

## 2019-06-19 ENCOUNTER — Other Ambulatory Visit: Payer: Self-pay

## 2019-06-19 ENCOUNTER — Ambulatory Visit (INDEPENDENT_AMBULATORY_CARE_PROVIDER_SITE_OTHER): Payer: BC Managed Care – PPO | Admitting: Women's Health

## 2019-06-19 ENCOUNTER — Encounter: Payer: Self-pay | Admitting: Women's Health

## 2019-06-19 VITALS — BP 105/68 | HR 81 | Ht 62.0 in | Wt 219.6 lb

## 2019-06-19 DIAGNOSIS — Z8742 Personal history of other diseases of the female genital tract: Secondary | ICD-10-CM

## 2019-06-19 DIAGNOSIS — N9089 Other specified noninflammatory disorders of vulva and perineum: Secondary | ICD-10-CM | POA: Diagnosis not present

## 2019-06-19 DIAGNOSIS — Z87440 Personal history of urinary (tract) infections: Secondary | ICD-10-CM

## 2019-06-19 DIAGNOSIS — G4700 Insomnia, unspecified: Secondary | ICD-10-CM

## 2019-06-19 LAB — POCT URINALYSIS DIPSTICK
Blood, UA: NEGATIVE
Glucose, UA: NEGATIVE
Ketones, UA: NEGATIVE
Leukocytes, UA: NEGATIVE
Nitrite, UA: NEGATIVE
Protein, UA: NEGATIVE

## 2019-06-19 MED ORDER — NYSTATIN-TRIAMCINOLONE 100000-0.1 UNIT/GM-% EX OINT: 1.0000 "application " | TOPICAL_OINTMENT | Freq: Two times a day (BID) | CUTANEOUS | 0 refills | Status: DC

## 2019-06-19 MED ORDER — ZOLPIDEM TARTRATE 10 MG PO TABS: 10.0000 mg | ORAL_TABLET | Freq: Every evening | ORAL | 0 refills | Status: DC | PRN

## 2019-06-19 NOTE — Addendum Note (Signed)
Addended by: Colen Darling on: 06/19/2019 12:53 PM   Modules accepted: Orders

## 2019-06-19 NOTE — Progress Notes (Signed)
GYN VISIT Patient name: Samantha Sandoval MRN [id number]  Date of birth: 30-May-1995 Chief Complaint:   Vaginal Pain  History of Present Illness:   Samantha Sandoval is a 24 y.o. G71P2012 Caucasian female being seen today for report of vulvar pain, 'feels raw'.  Was in hospital overnight last week for colitis, uti, and met sepsis criteria. Was on 2 antibiotics. Has had loose stools 1-2x/day since. Started period 5d ago. Wipes hard b/c doesn't like feeling wet. Noticed blood 'from both ends' when wiping the other day and small amt in toilet. Denies abnormal discharge, itching/odor.  Wants to get back on weight loss meds, has been off for about 13mhs Requests refill on ambien  Patient's last menstrual period was 06/14/2019. The current method of family planning is condoms.  Last pap 04/03/18. Results were:  normal, h/o CIN II-III Review of Systems:   Pertinent items are noted in HPI Denies fever/chills, dizziness, headaches, visual disturbances, fatigue, shortness of breath, chest pain, abdominal pain, vomiting, abnormal vaginal discharge/itching/odor/irritation, problems with periods, bowel movements, urination, or intercourse unless otherwise stated above.  Pertinent History Reviewed:  Reviewed past medical,surgical, social, obstetrical and family history.  Reviewed problem list, medications and allergies. Physical Assessment:   Vitals:   06/19/19 0903  BP: 105/68  Pulse: 81  Weight: 219 lb 9.6 oz (99.6 kg)  Height: 5' 2" (1.575 m)  Body mass index is 40.17 kg/m.       Physical Examination:   General appearance: alert, well appearing, and in no distress  Mental status: alert, oriented to person, place, and time  Skin: warm & dry   Cardiovascular: HRRR, normal heart rate noted  Respiratory: normal respiratory effort, no distress, LCTAB  Abdomen: soft, non-tender   Pelvic: normal external genitalia, vulva, vagina, cervix, uterus and adnexa, VULVA: normal appearing vulva with no masses,  tenderness or lesions, VAGINA: normal appearing vagina with normal color and discharge, no lesions, CERVIX: normal appearing cervix without discharge or lesions, RECTAL: normal rectal, no masses, no hemorrhoids, no visible blood  Extremities: no edema   Chaperone: JLevy Pupa   Urine dipstick neg  No results found for this or any previous visit (from the past 24 hour(s)).  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 nystatin-triamcinolone ointment (MYCOLOG)    Sig: Apply 1 application topically 2 (two) times daily.    Dispense:  30 g    Refill:  0    Order Specific Question:   Supervising Provider    Answer:   EElonda Husky LUTHER H [[id number]]  . zolpidem (AMBIEN) 10 MG tablet    Sig: Take 1 tablet (10 mg total) by mouth at bedtime as needed for sleep.    Dispense:  15 tablet    Refill:  0    Order Specific Question:   Supervising Provider    Answer:   EElonda Husky LUTHER H [[id number]]    No orders of the defined types were placed in this encounter.   Return for 1st available, Pap & physical w/ JAG.  [facility] [place] [facility]4/15/2021 9:48 AM

## 2019-06-25 ENCOUNTER — Other Ambulatory Visit: Payer: Self-pay

## 2019-06-25 ENCOUNTER — Telehealth (INDEPENDENT_AMBULATORY_CARE_PROVIDER_SITE_OTHER): Payer: BC Managed Care – PPO | Admitting: Adult Health

## 2019-06-25 ENCOUNTER — Encounter: Payer: Self-pay | Admitting: Adult Health

## 2019-06-25 VITALS — BP 105/68 | HR 81 | Ht 62.0 in | Wt 219.0 lb

## 2019-06-25 DIAGNOSIS — Z713 Dietary counseling and surveillance: Secondary | ICD-10-CM | POA: Diagnosis not present

## 2019-06-25 DIAGNOSIS — Z6841 Body Mass Index (BMI) 40.0 and over, adult: Secondary | ICD-10-CM

## 2019-06-25 MED ORDER — PHENTERMINE HCL 37.5 MG PO TABS: 37.5000 mg | ORAL_TABLET | Freq: Every day | ORAL | 0 refills | Status: DC

## 2019-06-25 NOTE — Progress Notes (Signed)
Patient ID: Samantha Sandoval, female   DOB: September 13, 1995, 24 y.o.   MRN: [id number]   TELEHEALTH VIRTUAL GYNECOLOGY VISIT ENCOUNTER NOTE  I connected with Samantha Sandoval on 06/25/19 at 11:30 AM EDT by telephone at home and verified that I am speaking with the correct person using two identifiers.   I discussed the limitations, risks, security and privacy concerns of performing an evaluation and management service by telephone and the availability of in person appointments. I also discussed with the patient that there may be a patient responsible charge related to this service. The patient expressed understanding and agreed to proceed.   History:  Samantha Sandoval is a 24 y.o. [phone] female being evaluated today for weight management, she wants to start adipex.She has used in the past. She denies any unusual headaches, chest pain or other concerns.       Past Medical History:  Diagnosis Date  . Anxiety   . Vaginal Pap smear, abnormal    Past Surgical History:  Procedure Laterality Date  . ABDOMINAL SURGERY    . CERVICAL ABLATION N/A 03/28/2017   Procedure: Laser Ablation of Cervix;  Surgeon: Florian Buff, MD;  Location: [facility];  Service: Gynecology;  Laterality: N/A;  . tubes in ears    . tumor 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     The following portions of the patient's history were reviewed and updated as appropriate: allergies, current medications, past family history, past medical history, past social history, past surgical history and problem list.   Health Maintenance: pap is due, has history of abnormal pap  Review of Systems:  Pertinent items noted in HPI and remainder of comprehensive ROS otherwise negative.  Physical Exam:   General:  Alert, oriented and cooperative.   Mental Status: Normal mood and affect perceived. Normal judgment and thought content.  Physical exam deferred due to nature of the encounter BP 105/68   Pulse 81   Ht 5\' 2"  (1.575 m)   Wt 219 lb (99.3 kg)   LMP 06/14/2019    Breastfeeding No   BMI 40.06 kg/m  Labs and Imaging Results for orders placed or performed in visit on 06/19/19 (from the past 336 hour(s))  POCT Urinalysis Dipstick   Collection Time: 06/19/19 12:53 PM  Result Value Ref Range   Color, UA     Clarity, UA     Glucose, UA Negative Negative   Bilirubin, UA     Ketones, UA neg    Spec Grav, UA     Blood, UA neg    pH, UA     Protein, UA Negative Negative   Urobilinogen, UA     Nitrite, UA neg    Leukocytes, UA Negative Negative   Appearance     Odor     No results found.    Assessment and Plan:       1. Weight loss counseling, encounter for Increase water Increase exercise, walking is fine Decrease portions Will rx adipex Meds ordered this encounter  Medications  . phentermine (ADIPEX-P) 37.5 MG tablet    Sig: Take 1 tablet (37.5 mg total) by mouth daily before breakfast.    Dispense:  30 tablet    Refill:  0    Order Specific Question:   Supervising Provider    Answer:   Tania Ade H [[id number]]  Follow up in 4 weeks for pap and physical and follow up on weight   2. Body mass index 40.0-44.9, adult ([place])  I discussed the assessment and treatment plan with the patient. The patient was provided an opportunity to ask questions and all were answered. The patient agreed with the plan and demonstrated an understanding of the instructions.   The patient was advised to call back or seek an in-person evaluation/go to the ED if the symptoms worsen or if the condition fails to improve as anticipated.  I provided 6 minutes of non-face-to-face time during this encounter.   Cyril Mourning, NP [facility] [employer], [facility]

## 2019-07-16 ENCOUNTER — Other Ambulatory Visit: Payer: BC Managed Care – PPO | Admitting: Women's Health

## 2019-07-23 ENCOUNTER — Telehealth: Payer: Self-pay | Admitting: Adult Health

## 2019-07-23 ENCOUNTER — Other Ambulatory Visit: Payer: BC Managed Care – PPO | Admitting: Adult Health

## 2019-07-23 NOTE — Telephone Encounter (Signed)
Pt states she had to r/s appt to 08/08/19 r/s to see eure but wants to see if she can still have her med refilled when she comes in.

## 2019-07-23 NOTE — Telephone Encounter (Signed)
Pt wants to know if she can get Phentermine refilled on June 4 so she don't have to schedule another appt. Seeing Dr. Despina Hidden that day. Please advise. Thanks!! JSY

## 2019-07-24 NOTE — Telephone Encounter (Signed)
I spoke with Samantha Sandoval and let her know that will work

## 2019-08-08 ENCOUNTER — Other Ambulatory Visit: Payer: BC Managed Care – PPO | Admitting: Obstetrics & Gynecology

## 2019-08-15 ENCOUNTER — Other Ambulatory Visit: Payer: BC Managed Care – PPO | Admitting: Obstetrics & Gynecology

## 2020-04-08 DIAGNOSIS — J029 Acute pharyngitis, unspecified: Secondary | ICD-10-CM | POA: Diagnosis not present

## 2020-04-08 DIAGNOSIS — J069 Acute upper respiratory infection, unspecified: Secondary | ICD-10-CM | POA: Diagnosis not present

## 2020-04-23 DIAGNOSIS — H6122 Impacted cerumen, left ear: Secondary | ICD-10-CM | POA: Diagnosis not present

## 2020-04-23 DIAGNOSIS — Z0000 Encounter for general adult medical examination without abnormal findings: Secondary | ICD-10-CM | POA: Diagnosis not present

## 2020-06-08 ENCOUNTER — Telehealth: Payer: Self-pay | Admitting: Adult Health

## 2020-06-08 ENCOUNTER — Other Ambulatory Visit: Payer: Self-pay

## 2020-06-08 NOTE — Telephone Encounter (Signed)
Pt requesting a call back from [place], states she has questions about her medicine

## 2020-06-14 ENCOUNTER — Telehealth (INDEPENDENT_AMBULATORY_CARE_PROVIDER_SITE_OTHER): Payer: BC Managed Care – PPO | Admitting: Adult Health

## 2020-06-14 ENCOUNTER — Other Ambulatory Visit: Payer: Self-pay

## 2020-06-14 ENCOUNTER — Encounter: Payer: Self-pay | Admitting: Adult Health

## 2020-06-14 VITALS — BP 125/83 | Ht 63.0 in | Wt 211.7 lb

## 2020-06-14 DIAGNOSIS — Z6837 Body mass index (BMI) 37.0-37.9, adult: Secondary | ICD-10-CM | POA: Diagnosis not present

## 2020-06-14 DIAGNOSIS — Z713 Dietary counseling and surveillance: Secondary | ICD-10-CM

## 2020-06-14 MED ORDER — PHENTERMINE HCL 37.5 MG PO CAPS: 37.5000 mg | ORAL_CAPSULE | ORAL | 0 refills | Status: DC

## 2020-06-14 NOTE — Progress Notes (Signed)
Patient ID: Samantha Sandoval, female   DOB: [birth year], 25 y.o.   MRN: [id number]   TELEHEALTH GYNECOLOGY VISIT ENCOUNTER NOTE  [facility]  Patient location: Home  I connected with Samantha Sandoval on 06/14/20 at  3:30 PM EDT by telephone and verified that I am speaking with the correct person using two identifiers. Patient was unable to do MyChart audiovisual encounter due to technical difficulties, she tried several times.    I discussed the limitations, risks, security and privacy concerns of performing an evaluation and management service by telephone and the availability of in person appointments. I also discussed with the patient that there may be a patient responsible charge related to this service. The patient expressed understanding and agreed to proceed.   History:  Samantha Sandoval is a 25 y.o. [phone] female being evaluated today for starting phentermine for weight loss. She denies any trouble sleeping or other concerns.       Past Medical History:  Diagnosis Date  . Anxiety   . Vaginal Pap smear, abnormal    Past Surgical History:  Procedure Laterality Date  . ABDOMINAL SURGERY    . CERVICAL ABLATION N/A 03/28/2017   Procedure: Laser Ablation of Cervix;  Surgeon: Lazaro Arms, MD;  Location: [facility];  Service: Gynecology;  Laterality: N/A;  . tubes in ears    . tumor 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     The following portions of the patient's history were reviewed and updated as appropriate: allergies, current medications, past family history, past medical history, past social history, past surgical history and problem list.   Health Maintenance:  Normal pap and negative HRHPV on 04/03/2018.   Review of Systems:  Pertinent items noted in HPI and remainder of comprehensive ROS otherwise negative.  Physical Exam:   General:  Alert, oriented and cooperative.   Mental Status: Normal mood and affect perceived. Normal judgment and thought content.   Physical exam deferred due to nature of the encounter BP 125/83 (BP Location: Left Arm, Patient Position: Sitting, Cuff Size: Normal)   Ht 5\' 3"  (1.6 m)   Wt 211 lb 11.2 oz (96 kg)   LMP 05/31/2020   BMI 37.50 kg/m   Fall risk is low  Upstream - 06/14/20 1435      Pregnancy Intention Screening   Does the patient want to become pregnant in the next year? No    Does the patient's partner want to become pregnant in the next year? No    Would the patient like to discuss contraceptive options today? No      Contraception Wrap Up   Current Method Female Condom    End Method Female Condom    Contraception Counseling Provided No          Labs and Imaging No results found for this or any previous visit (from the past 336 hour(s)). No results found.    Assessment and Plan:        1. Weight loss counseling, encounter for  -decrease portions Increase water and acitivty Will rx phentermine Meds ordered this encounter  Medications  . phentermine 37.5 MG capsule    Sig: Take 1 capsule (37.5 mg total) by mouth every morning.    Dispense:  30 capsule    Refill:  0    Order Specific Question:   Supervising Provider    Answer:   08/14/20 H [[id number]]   Follow up in 4 weeks for weight and BP check  2. Body mass index 37.0-37.9, adult       I discussed the assessment and treatment plan with the patient. The patient was provided an opportunity to ask questions and all were answered. The patient agreed with the plan and demonstrated an understanding of the instructions.   The patient was advised to call back or seek an in-person evaluation/go to the ED if the symptoms worsen or if the condition fails to improve as anticipated.  I provided 5 minutes of non-face-to-face time during this encounter. I was in my office at [facility] during this encounter.   Cyril Mourning, NP [facility] [employer], [facility]

## 2020-07-15 ENCOUNTER — Other Ambulatory Visit: Payer: BC Managed Care – PPO | Admitting: Women's Health

## 2020-08-04 DIAGNOSIS — N83299 Other ovarian cyst, unspecified side: Secondary | ICD-10-CM | POA: Diagnosis not present

## 2020-08-04 DIAGNOSIS — N760 Acute vaginitis: Secondary | ICD-10-CM | POA: Diagnosis not present

## 2020-08-04 DIAGNOSIS — R1030 Lower abdominal pain, unspecified: Secondary | ICD-10-CM | POA: Diagnosis not present

## 2020-08-06 ENCOUNTER — Encounter: Payer: Self-pay | Admitting: Adult Health

## 2020-08-06 ENCOUNTER — Other Ambulatory Visit: Payer: Self-pay

## 2020-08-06 ENCOUNTER — Ambulatory Visit (INDEPENDENT_AMBULATORY_CARE_PROVIDER_SITE_OTHER): Payer: BC Managed Care – PPO | Admitting: Adult Health

## 2020-08-06 VITALS — BP 100/69 | HR 83 | Ht 62.0 in | Wt 213.0 lb

## 2020-08-06 DIAGNOSIS — N941 Unspecified dyspareunia: Secondary | ICD-10-CM

## 2020-08-06 DIAGNOSIS — Z8742 Personal history of other diseases of the female genital tract: Secondary | ICD-10-CM

## 2020-08-06 DIAGNOSIS — R102 Pelvic and perineal pain: Secondary | ICD-10-CM | POA: Diagnosis not present

## 2020-08-06 NOTE — Progress Notes (Signed)
  Subjective:     Patient ID: Samantha Sandoval, female   DOB: May 20, 1995, 25 y.o.   MRN: [id number]  HPI Samantha Sandoval is a 25 year old white female,marriedm G3P2012, in complaining of pelvic pain and was seen at [facility] ER about 2 days ago and had CT that showed ovarian cysts, she has pain that radiates to her back for about a week, and has pain with sex since ablation in 2019. She was treated for BV with Flagyl in ER.   Lab Results  Component Value Date   DIAGPAP  04/03/2018    NEGATIVE FOR INTRAEPITHELIAL LESIONS OR MALIGNANCY.   HPV NOT DETECTED 04/03/2018    Review of Systems Pelvic pain radiates to back +pain with sex   Reviewed past medical,surgical, social and family history. Reviewed medications and allergies.  Objective:   Physical Exam BP 100/69 (BP Location: Right Arm, Patient Position: Sitting, Cuff Size: Normal)   Pulse 83   Ht 5\' 2"  (1.575 m)   Wt 213 lb (96.6 kg)   LMP 07/19/2020 (Approximate)   BMI 38.96 kg/m  Skin warm and dry.Pelvic: external genitalia is normal in appearance no lesions, vagina: pink with good moisture,urethra has no lesions or masses noted, cervix:smooth and bulbous, uterus: normal size, shape and contour, mildly tender, no masses felt, adnexa: no masses,+tenderness noted. Bladder is non tender and no masses felt.      Upstream - 08/06/20 1300      Pregnancy Intention Screening   Does the patient want to become pregnant in the next year? No    Does the patient's partner want to become pregnant in the next year? No    Would the patient like to discuss contraceptive options today? No      Contraception Wrap Up   Current Method Female Condom    End Method Female Condom    Contraception Counseling Provided No         Examination chaperoned by 10/06/20 RN  Assessment:     1. Pelvic pain -will get pelvic [facility]anna Kobus at [facility] 08/13/20 at 3:30 pm  Take tylenol and advil together for cramping  2. Dyspareunia, female   3. History of ovarian cyst Will get 10/13/20      Plan:     Follow up with me in 10 days to review [place] and options

## 2020-08-10 ENCOUNTER — Telehealth: Payer: Self-pay | Admitting: Adult Health

## 2020-08-10 NOTE — Telephone Encounter (Signed)
Pt wants to know if Samantha Sandoval can call her with her U/S results   U/S is scheduled for 6/10 & she's scheduled 6/13 with Samantha Sandoval for U/S f/up  (explained to pt that the results may not be read by radiologist for Samantha Sandoval to be able to call her before her appointment on Monday, states she does not want to cancel her appointment but would like Samantha Sandoval to call her with the results)    Please advise & notify pt

## 2020-08-13 ENCOUNTER — Ambulatory Visit (HOSPITAL_COMMUNITY)
Admission: RE | Admit: 2020-08-13 | Discharge: 2020-08-13 | Disposition: A | Discharge status: Home / Self Care | Payer: BC Managed Care – PPO | Source: Ambulatory Visit | Attending: Adult Health | Admitting: Adult Health

## 2020-08-13 ENCOUNTER — Other Ambulatory Visit: Payer: Self-pay

## 2020-08-13 DIAGNOSIS — Z8742 Personal history of other diseases of the female genital tract: Secondary | ICD-10-CM | POA: Insufficient documentation

## 2020-08-13 DIAGNOSIS — N941 Unspecified dyspareunia: Secondary | ICD-10-CM | POA: Diagnosis not present

## 2020-08-13 DIAGNOSIS — F909 Attention-deficit hyperactivity disorder, unspecified type: Secondary | ICD-10-CM | POA: Diagnosis not present

## 2020-08-13 DIAGNOSIS — R102 Pelvic and perineal pain: Secondary | ICD-10-CM

## 2020-08-16 ENCOUNTER — Telehealth (INDEPENDENT_AMBULATORY_CARE_PROVIDER_SITE_OTHER): Payer: BC Managed Care – PPO | Admitting: Adult Health

## 2020-08-16 ENCOUNTER — Encounter: Payer: Self-pay | Admitting: Adult Health

## 2020-08-16 DIAGNOSIS — R102 Pelvic and perineal pain: Secondary | ICD-10-CM | POA: Diagnosis not present

## 2020-08-16 NOTE — Progress Notes (Signed)
Patient ID: Samantha Sandoval, female   DOB: 11-16-1995, 25 y.o.   MRN: [id number]   TELEHEALTH GYNECOLOGY VISIT ENCOUNTER NOTE  [facility] location: [facility]   Patient location: Home  I connected with Keelynn Furgerson on 08/16/20 at 11:50 AM EDT by telephone and verified that I am speaking with the correct person using two identifiers. Patient was unable to do MyChart audiovisual encounter due to technical difficulties, she tried several times.    I discussed the limitations, risks, security and privacy concerns of performing an evaluation and management service by telephone and the availability of in person appointments. I also discussed with the patient that there may be a patient responsible charge related to this service. The patient expressed understanding and agreed to proceed.   History:  Samantha Sandoval is a 25 y.o. [phone] female being evaluated today for pelvic pain, had [place] 08/13/20 She denies any abnormal vaginal discharge, bleeding, or other concerns.       Past Medical History:  Diagnosis Date   Anxiety    Vaginal Pap smear, abnormal    Past Surgical History:  Procedure Laterality Date   ABDOMINAL SURGERY     CERVICAL ABLATION N/A 03/28/2017   Procedure: Laser Ablation of Cervix;  Surgeon: Lazaro Arms, MD;  Location: [facility];  Service: Gynecology;  Laterality: N/A;   tubes in ears     tumor 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     The following portions of the patient's history were reviewed and updated as appropriate: allergies, current medications, past family history, past medical history, past social history, past surgical history and problem list.   Health Maintenance:  Normal pap and negative HRHPV on  Lab Results  Component Value Date   DIAGPAP  04/03/2018    NEGATIVE FOR INTRAEPITHELIAL LESIONS OR MALIGNANCY.   HPV NOT DETECTED 04/03/2018     Review of Systems:  Pertinent items noted in HPI and remainder of comprehensive ROS otherwise  negative.  Physical Exam:   General:  Alert, oriented and cooperative.   Mental Status: Normal mood and affect perceived. Normal judgment and thought content.  Physical exam deferred due to nature of the encounter  Labs and Imaging No results found for this or any previous visit (from the past 336 hour(s)). US PELVIC COMPLETE WITH TRANSVAGINAL  Result Date: 08/13/2020 CLINICAL DATA:  Pelvic pain for 1 week, dyspareunia, history of ovarian cyst, prior endometrial ablation. LMP 07/19/2020 EXAM: TRANSABDOMINAL AND TRANSVAGINAL ULTRASOUND OF PELVIS TECHNIQUE: Both transabdominal and transvaginal ultrasound examinations of the pelvis were performed. Transabdominal technique was performed for global imaging of the pelvis including uterus, ovaries, adnexal regions, and pelvic cul-de-sac. It was necessary to proceed with endovaginal exam following the transabdominal exam to visualize the uterus, endometrium, and ovaries. COMPARISON:  03/29/2018 FINDINGS: Uterus Measurements: 8.5 x 4.5 x 5.4 cm = volume: 107 mL. Anteverted. Normal morphology without mass Endometrium Thickness: 10 mm.  No endometrial fluid or focal abnormality. Right ovary Measurements: 3.1 x 1.6 x 1.4 cm = volume: 3.7 mL. Normal morphology without mass Left ovary Measurements: 2.9 x 3.4 x 2.2 cm = volume: 11.1 mL. Normal morphology without mass Other findings No free pelvic fluid.  No adnexal masses. IMPRESSION: Normal exam. Electronically Signed   By: Ulyses Southward M.D.   On: 08/13/2020 16:37      Assessment and Plan:     1. Pelvic pain Will follow for now      I discussed the assessment and treatment plan with  the patient. The patient was provided an opportunity to ask questions and all were answered. The patient agreed with the plan and demonstrated an understanding of the instructions.   The patient was advised to call back or seek an in-person evaluation/go to the ED if the symptoms worsen or if the condition fails to improve as  anticipated.  I provided 5 minutes of non-face-to-face time during this encounter. I was in my office at [facility] during this encounter  Cyril Mourning, NP [facility] [employer], [facility]

## 2020-08-31 DIAGNOSIS — R309 Painful micturition, unspecified: Secondary | ICD-10-CM | POA: Diagnosis not present

## 2020-09-21 NOTE — Telephone Encounter (Signed)
Pt has questions about starting a medication (told pt [name] isn't in the office this week - she requested message be sent to Selena Batten B)   Please advise & notify pt   [phone]

## 2020-10-04 ENCOUNTER — Telehealth: Payer: BC Managed Care – PPO | Admitting: Adult Health

## 2020-10-05 ENCOUNTER — Telehealth (INDEPENDENT_AMBULATORY_CARE_PROVIDER_SITE_OTHER): Payer: BC Managed Care – PPO | Admitting: Adult Health

## 2020-10-05 ENCOUNTER — Encounter: Payer: Self-pay | Admitting: Adult Health

## 2020-10-05 VITALS — BP 120/78 | Ht 62.0 in | Wt 211.0 lb

## 2020-10-05 DIAGNOSIS — R6882 Decreased libido: Secondary | ICD-10-CM

## 2020-10-05 DIAGNOSIS — Z6838 Body mass index (BMI) 38.0-38.9, adult: Secondary | ICD-10-CM | POA: Insufficient documentation

## 2020-10-05 DIAGNOSIS — Z713 Dietary counseling and surveillance: Secondary | ICD-10-CM

## 2020-10-05 MED ORDER — PHENTERMINE HCL 37.5 MG PO TABS: 37.5000 mg | ORAL_TABLET | Freq: Every day | ORAL | 0 refills | Status: DC

## 2020-10-05 NOTE — Progress Notes (Signed)
Patient ID: Samantha Sandoval, female   DOB: 27-Oct-1995, 27 y.o.   MRN: [id number]   TELEHEALTH GYNECOLOGY VISIT ENCOUNTER NOTE  Provider location: [facility]   Patient location: Home  I connected with Samantha Sandoval on 10/05/20 at 11:10 AM EDT by telephone and verified that I am speaking with the correct person using two identifiers. Patient was unable to do MyChart audiovisual encounter due to technical difficulties, she tried several times.    I discussed the limitations, risks, security and privacy concerns of performing an evaluation and management service by telephone and the availability of in person appointments. I also discussed with the patient that there may be a patient responsible charge related to this service. The patient expressed understanding and agreed to proceed.   History:  Samantha Sandoval is a 25 y.o. [phone] female being evaluated today for weight loss and has decreased sex drive.  She is using condoms but thinking about tubal ligation. She denies any abnormal vaginal discharge, bleeding, pelvic pain or other concerns.       Past Medical History:  Diagnosis Date   Anxiety    Vaginal Pap smear, abnormal    Past Surgical History:  Procedure Laterality Date   ABDOMINAL SURGERY     CERVICAL ABLATION N/A 03/28/2017   Procedure: Laser Ablation of Cervix;  Surgeon: Lazaro Arms, MD;  Location: [facility];  Service: Gynecology;  Laterality: N/A;   tubes in ears     tumor 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     The following portions of the patient's history were reviewed and updated as appropriate: allergies, current medications, past family history, past medical history, past social history, past surgical history and problem list.   Health Maintenance:  Normal pap and negative HRHPV on 04/03/18.  Review of Systems:  Pertinent items noted in HPI and remainder of comprehensive ROS otherwise negative.  Physical Exam:   General:  Alert, oriented and cooperative.    Mental Status: Normal mood and affect perceived. Normal judgment and thought content.  Physical exam deferred due to nature of the encounter BP 120/78   Ht 5\' 2"  (1.575 m)   Wt 211 lb (95.7 kg)   LMP 09/15/2020   BMI 38.59 kg/m    Upstream - 10/05/20 1123       Pregnancy Intention Screening   Does the patient want to become pregnant in the next year? No    Does the patient's partner want to become pregnant in the next year? No    Would the patient like to discuss contraceptive options today? No      Contraception Wrap Up   Current Method Female Condom    End Method Female Condom    Contraception Counseling Provided No             Labs and Imaging No results found for this or any previous visit (from the past 336 hour(s)). No results found.    Assessment and Plan:     1. Weight loss counseling, encounter for Will rx adipex Meds ordered this encounter  Medications   phentermine (ADIPEX-P) 37.5 MG tablet    Sig: Take 1 tablet (37.5 mg total) by mouth daily before breakfast. Take 1 daily    Dispense:  30 tablet    Refill:  0    Order Specific Question:   Supervising Provider    Answer:   12/05/20 H [[id number]]   Increase water,decrease portions and exercise  2. Body mass index 38.0-38.9, adult  3. Decreased sex drive Research ADDYI       I discussed the assessment and treatment plan with the patient. The patient was provided an opportunity to ask questions and all were answered. The patient agreed with the plan and demonstrated an understanding of the instructions.   The patient was advised to call back or seek an in-person evaluation/go to the ED if the symptoms worsen or if the condition fails to improve as anticipated.  I provided 8 minutes of non-face-to-face time during this encounter. I was in my office at [facility] during this encounter   Cyril Mourning, NP [facility] [employer], [facility]

## 2020-10-28 DIAGNOSIS — E039 Hypothyroidism, unspecified: Secondary | ICD-10-CM | POA: Diagnosis not present

## 2020-10-28 DIAGNOSIS — R000 Tachycardia, unspecified: Secondary | ICD-10-CM | POA: Diagnosis not present

## 2020-10-28 DIAGNOSIS — R079 Chest pain, unspecified: Secondary | ICD-10-CM | POA: Diagnosis not present

## 2020-11-02 ENCOUNTER — Telehealth: Payer: BC Managed Care – PPO | Admitting: Adult Health

## 2020-11-18 DIAGNOSIS — F419 Anxiety disorder, unspecified: Secondary | ICD-10-CM | POA: Diagnosis not present

## 2020-11-18 DIAGNOSIS — R000 Tachycardia, unspecified: Secondary | ICD-10-CM | POA: Diagnosis not present

## 2020-11-18 DIAGNOSIS — E669 Obesity, unspecified: Secondary | ICD-10-CM | POA: Diagnosis not present

## 2020-11-18 DIAGNOSIS — E079 Disorder of thyroid, unspecified: Secondary | ICD-10-CM | POA: Diagnosis not present

## 2020-11-18 DIAGNOSIS — R002 Palpitations: Secondary | ICD-10-CM | POA: Diagnosis not present

## 2020-11-20 DIAGNOSIS — A419 Sepsis, unspecified organism: Secondary | ICD-10-CM | POA: Diagnosis not present

## 2020-11-20 DIAGNOSIS — R109 Unspecified abdominal pain: Secondary | ICD-10-CM | POA: Diagnosis not present

## 2020-11-20 DIAGNOSIS — K5289 Other specified noninfective gastroenteritis and colitis: Secondary | ICD-10-CM | POA: Diagnosis not present

## 2020-11-20 DIAGNOSIS — E039 Hypothyroidism, unspecified: Secondary | ICD-10-CM | POA: Diagnosis not present

## 2020-11-20 DIAGNOSIS — F419 Anxiety disorder, unspecified: Secondary | ICD-10-CM | POA: Diagnosis not present

## 2020-11-29 ENCOUNTER — Ambulatory Visit (INDEPENDENT_AMBULATORY_CARE_PROVIDER_SITE_OTHER): Payer: BC Managed Care – PPO | Admitting: Obstetrics & Gynecology

## 2020-11-29 ENCOUNTER — Other Ambulatory Visit (HOSPITAL_COMMUNITY)
Admission: RE | Admit: 2020-11-29 | Discharge: 2020-11-29 | Disposition: A | Discharge status: Home / Self Care | Payer: BC Managed Care – PPO | Source: Ambulatory Visit | Attending: Obstetrics & Gynecology | Admitting: Obstetrics & Gynecology

## 2020-11-29 ENCOUNTER — Telehealth: Payer: Self-pay | Admitting: Obstetrics & Gynecology

## 2020-11-29 ENCOUNTER — Other Ambulatory Visit: Payer: Self-pay

## 2020-11-29 ENCOUNTER — Encounter: Payer: Self-pay | Admitting: Obstetrics & Gynecology

## 2020-11-29 VITALS — BP 112/76 | HR 82 | Ht 62.0 in | Wt 215.0 lb

## 2020-11-29 DIAGNOSIS — Z124 Encounter for screening for malignant neoplasm of cervix: Secondary | ICD-10-CM | POA: Diagnosis not present

## 2020-11-29 DIAGNOSIS — N814 Uterovaginal prolapse, unspecified: Secondary | ICD-10-CM

## 2020-11-29 MED ORDER — ESTRADIOL 0.1 MG/GM VA CREA: TOPICAL_CREAM | VAGINAL | 12 refills | Status: DC

## 2020-11-29 NOTE — Telephone Encounter (Signed)
This encounter was created in error - please disregard.

## 2020-11-29 NOTE — Telephone Encounter (Signed)
Patient Stating that she feels like her uterus has fallen out and she can feel something when she walks. Patient would like to be seen today. Please contact pt asap.

## 2020-11-29 NOTE — Progress Notes (Signed)
Chief Complaint  Patient presents with   feels like something is coming out of vagina      25 y.o. [id number] Patient's last menstrual period was 11/08/2020. The current method of family planning is none.  Outpatient Encounter Medications as of 11/29/2020  Medication Sig   dicyclomine (BENTYL) 20 MG tablet Take 20 mg by mouth 3 (three) times daily.   escitalopram (LEXAPRO) 10 MG tablet Take 1 tablet (10 mg total) by mouth daily.   estradiol (ESTRACE) 0.1 MG/GM vaginal cream Use 1 grams nightly   LORazepam (ATIVAN) 0.5 MG tablet Take 0.5 mg by mouth as needed.   amphetamine-dextroamphetamine (ADDERALL XR) 10 MG 24 hr capsule Take 10 mg by mouth every morning. (Patient not taking: No sig reported)   ketorolac (TORADOL) 10 MG tablet Take by mouth. (Patient not taking: Reported on 11/29/2020)   phentermine (ADIPEX-P) 37.5 MG tablet Take 1 tablet (37.5 mg total) by mouth daily before breakfast. Take 1 daily (Patient not taking: Reported on 11/29/2020)   No facility-administered encounter medications on file as of 11/29/2020.    Subjective Pt has recently felt something falling when she walks and having intercourse Some pressure and associated discomfort Minimal urinary loss Some increased discharge No unusual constipation Past Medical History:  Diagnosis Date   Anxiety    Vaginal Pap smear, abnormal     Past Surgical History:  Procedure Laterality Date   ABDOMINAL SURGERY     CERVICAL ABLATION N/A 03/28/2017   Procedure: Laser Ablation of Cervix;  Surgeon: Lazaro Arms, MD;  Location: [facility];  Service: Gynecology;  Laterality: N/A;   tubes in ears     tumor 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      OB History     Gravida  3   Para  2   Term  2   Preterm      AB  1   Living  2      SAB  1   IAB      Ectopic      Multiple  0   Live Births  2           Allergies  Allergen Reactions   Hibiclens [Chlorhexidine] Hives    Pt states that her skin burns.    Social  History   Socioeconomic History   Marital status: Married    Spouse name: Not on file   Number of children: 2   Years of education: Not on file   Highest education level: Not on file  Occupational History   Not on file  Tobacco Use   Smoking status: Former    Packs/day: 0.25    Types: Cigarettes   Smokeless tobacco: Never  Vaping Use   Vaping Use: Never used  Substance and Sexual Activity   Alcohol use: No    Comment: none   Drug use: No    Comment: denies   Sexual activity: Yes    Birth control/protection: Condom  Other Topics Concern   Not on file  Social History Narrative   Not on file   Social Determinants of Health   Financial Resource Strain: Not on file  Food Insecurity: Not on file  Transportation Needs: Not on file  Physical Activity: Not on file  Stress: Not on file  Social Connections: Not on file    Family History  Problem Relation Age of Onset   Diabetes Maternal Grandmother    Hypertension Maternal Grandmother    COPD Maternal  Grandmother    Scoliosis Maternal Grandmother    Heart disease Maternal Grandmother    Hypertension Maternal Grandfather    Other Maternal Grandfather        2 stents in heart   Heart attack Paternal Grandfather    Congestive Heart Failure Neg Hx     Medications:       Current Outpatient Medications:    dicyclomine (BENTYL) 20 MG tablet, Take 20 mg by mouth 3 (three) times daily., Disp: , Rfl:    escitalopram (LEXAPRO) 10 MG tablet, Take 1 tablet (10 mg total) by mouth daily., Disp: 30 tablet, Rfl: 6   estradiol (ESTRACE) 0.1 MG/GM vaginal cream, Use 1 grams nightly, Disp: 30 g, Rfl: 12   LORazepam (ATIVAN) 0.5 MG tablet, Take 0.5 mg by mouth as needed., Disp: , Rfl:    amphetamine-dextroamphetamine (ADDERALL XR) 10 MG 24 hr capsule, Take 10 mg by mouth every morning. (Patient not taking: No sig reported), Disp: , Rfl:    ketorolac (TORADOL) 10 MG tablet, Take by mouth. (Patient not taking: Reported on 11/29/2020), Disp:  , Rfl:    phentermine (ADIPEX-P) 37.5 MG tablet, Take 1 tablet (37.5 mg total) by mouth daily before breakfast. Take 1 daily (Patient not taking: Reported on 11/29/2020), Disp: 30 tablet, Rfl: 0  Objective Blood pressure 112/76, pulse 82, height 5\' 2"  (1.575 m), weight 215 lb (97.5 kg), last menstrual period 11/08/2020.  General WDWN female NAD Vulva:  normal appearing vulva with no masses, tenderness or lesions Vagina:  normal mucosa, no discharge, grade 2 cystocoele, hypermobility with coughing and especially with standing Cervix:  Normal no lesions,Pap done Uterus:  normal size, contour, position, consistency, mobility, non-tender, Grade 2 relaxation, bladder is "in front" keeping it up better Adnexa: ovaries:present,  normal adnexa in size, nontender and no masses   Pertinent ROS No burning with urination, frequency or urgency No nausea, vomiting or diarrhea Nor fever chills or other constitutional symptoms   Labs or studies     Impression Diagnoses this Encounter::   ICD-10-CM   1. Cystocele with uterine descensus  N81.4    Primarily cystocoele at this point but uterine relaxation "behind" it as well    2. Screening for cervical cancer  Z12.4 Cytology - PAP      Established relevant diagnosis(es):   Plan/Recommendations: Meds ordered this encounter  Medications   estradiol (ESTRACE) 0.1 MG/GM vaginal cream    Sig: Use 1 grams nightly    Dispense:  30 g    Refill:  12    Labs or Scans Ordered: No orders of the defined types were placed in this encounter.   Management:: Pt really wants to have it "fixed" but we had a long conversation about it not being that simple at her age, where multiple lifetime repairs are likely  She adamantly does not want a pessary(her grandmother had one)  Really want to delay surgical repair as long as possible due to her age and the inevitable need to have multiple repairs in her lifetime  Will trial vaginal estrogen cream,  Kegel exercises and recommend weight loss all to try to mitigate the relaxation  The weight loss will be important even if repairs done to extend the life of the repair  Follow up Return in about 3 months (around 02/28/2021) for Follow up, with Dr 03/02/2021.      All questions were answered.

## 2020-11-29 NOTE — Telephone Encounter (Signed)
Returned pt's call for clarification. Two identifiers used. Pt states that she feels something coming out of her vagina and it feels like a hard ball when she's sitting. She states there is some slight bleeding and some pain at her vagina and in her lower abdomen. Pt transferred up to front office to make appt to be seen. Pt confirmed understanding.

## 2020-12-01 LAB — CYTOLOGY - PAP
Chlamydia: NEGATIVE
Comment: NEGATIVE
Comment: NEGATIVE
Comment: NORMAL
Diagnosis: NEGATIVE
High risk HPV: NEGATIVE
Neisseria Gonorrhea: NEGATIVE

## 2021-02-21 ENCOUNTER — Ambulatory Visit: Payer: BC Managed Care – PPO | Admitting: Obstetrics & Gynecology

## 2021-04-29 ENCOUNTER — Ambulatory Visit: Payer: BC Managed Care – PPO | Admitting: Advanced Practice Midwife

## 2021-05-04 ENCOUNTER — Other Ambulatory Visit: Payer: Self-pay

## 2021-05-04 ENCOUNTER — Ambulatory Visit (INDEPENDENT_AMBULATORY_CARE_PROVIDER_SITE_OTHER): Payer: BC Managed Care – PPO | Admitting: Advanced Practice Midwife

## 2021-05-04 ENCOUNTER — Encounter: Payer: Self-pay | Admitting: Advanced Practice Midwife

## 2021-05-04 VITALS — BP 100/65 | HR 81 | Ht 62.0 in | Wt 223.0 lb

## 2021-05-04 DIAGNOSIS — N941 Unspecified dyspareunia: Secondary | ICD-10-CM | POA: Diagnosis not present

## 2021-05-04 DIAGNOSIS — Z8742 Personal history of other diseases of the female genital tract: Secondary | ICD-10-CM | POA: Diagnosis not present

## 2021-05-04 DIAGNOSIS — Z713 Dietary counseling and surveillance: Secondary | ICD-10-CM

## 2021-05-04 NOTE — Progress Notes (Signed)
? ?  GYN VISIT ?Patient name: Samantha Sandoval MRN [id number]  Date of birth: 1996-02-18 ?Chief Complaint:   ?Dyspareunia ? ?History of Present Illness:   ?Samantha Sandoval is a 26 y.o. G77P2012 Caucasian female being seen today for eval of painful sex, decreased libido, and f/u of cystocele dx by Dr Despina Hidden in Sept 2022 (tx Estrace, Kegal, weight loss). Was to f/u with him for plan in Dec. Also concerned because her mother had hyst at age 75 due to cervical/ovarian cancer concern. Is having trouble losing weight as well. ? ?Patient's last menstrual period was 04/19/2021 (exact date). ? ?Last pap Sept 2022. Results were: NILM w/ HRHPV negative ? ?Depression screen [facility] 2/9 08/16/2020 04/03/2018 03/22/2018 02/20/2018 06/19/2017  ?Decreased Interest 0 0 0 0 0  ?Down, Depressed, Hopeless 0 0 0 0 0  ?PHQ - 2 Score 0 0 0 0 0  ?Altered sleeping - - - - 0  ?Tired, decreased energy - - - - 0  ?Change in appetite - - - - 0  ?Feeling bad or failure about yourself  - - - - 0  ?Trouble concentrating - - - - 0  ?Moving slowly or fidgety/restless - - - - 0  ?Suicidal thoughts - - - - 0  ?PHQ-9 Score - - - - 0  ?Difficult doing work/chores - - - - Not difficult at all  ? ? No flowsheet data found. ? ? ?Review of Systems:   ?Pertinent items are noted in HPI ?Denies fever/chills, dizziness, headaches, visual disturbances, fatigue, shortness of breath, chest pain, abdominal pain, vomiting, abnormal vaginal discharge/itching/odor/irritation, problems with periods, bowel movements, urination, or intercourse unless otherwise stated above.  ?Pertinent History Reviewed:  ?Reviewed past medical,surgical, social, obstetrical and family history.  ?Reviewed problem list, medications and allergies. ?Physical Assessment:  ? ?Vitals:  ? 05/04/21 0910  ?BP: 100/65  ?Pulse: 81  ?Weight: 223 lb (101.2 kg)  ?Height: 5\' 2"  (1.575 m)  ?Body mass index is 40.79 kg/m?. ? ?     Physical Examination:  ? General appearance: alert, well appearing, and in no distress ? Mental  status: alert, oriented to person, place, and time ? Skin: warm & dry  ? Cardiovascular: normal heart rate noted ? Respiratory: normal respiratory effort, no distress ? Abdomen: soft, non-tender  ? Pelvic: normal external genitalia, vulva, vagina; uterine relaxation with slight bladder protrusion esp with coughing ? Extremities: no edema  ? ? ?No results found for this or any previous visit (from the past 24 hour(s)).  ?Assessment & Plan:  ?1) Cystocele> make plan with Dr to treatment ? ?2) Dyspareunia> may be exacerbated by cystocele; no report of past trauma/vaginitis/vaginismus; worse w deep penetration ? ?3) Decreased libido> related to dyspareunia; given [employer] pamphlet ? ?4) Difficulty w weight loss> given flyer for [facility] ? ?Meds: No orders of the defined types were placed in this encounter. ? ? ?No orders of the defined types were placed in this encounter. ? ? ?Return for f/u Dr Despina Hidden, ASAP; for cystocele/dyspareunia, MyChart video. ? ?Despina Hidden CNM ?05/04/2021 ?9:32 AM  ?

## 2021-05-05 ENCOUNTER — Telehealth (INDEPENDENT_AMBULATORY_CARE_PROVIDER_SITE_OTHER): Payer: BC Managed Care – PPO | Admitting: Obstetrics & Gynecology

## 2021-05-05 ENCOUNTER — Encounter: Payer: Self-pay | Admitting: Obstetrics & Gynecology

## 2021-05-05 DIAGNOSIS — N941 Unspecified dyspareunia: Secondary | ICD-10-CM

## 2021-05-05 DIAGNOSIS — N814 Uterovaginal prolapse, unspecified: Secondary | ICD-10-CM

## 2021-05-05 NOTE — Progress Notes (Signed)
   TELEHEALTH MyChart VIRTUAL GYNECOLOGY VISIT ENCOUNTER NOTE  I connected with Samantha Sandoval on 11/30/21 at  3:50 PM EST by telephone at home and verified that I am speaking with the correct person using two identifiers.   I am in my office Pt is in her car Total time 10 minutes   History:  Samantha Sandoval is a 26 y.o. [phone] female being evaluated today for dyspareunia due to prolapse and cystocoele. She denies any abnormal vaginal discharge, bleeding, pelvic pain or other concerns.       Past Medical History:  Diagnosis Date   Anxiety    Hyperthyroidism    Vaginal Pap smear, abnormal    Past Surgical History:  Procedure Laterality Date   ABDOMINAL SURGERY     CERVICAL ABLATION N/A 03/28/2017   Procedure: Laser Ablation of Cervix;  Surgeon: Lazaro Arms, MD;  Location: [facility];  Service: Gynecology;  Laterality: N/A;   tubes in ears     tumor 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     VAGINAL HYSTERECTOMY N/A 08/24/2021   Procedure: HYSTERECTOMY VAGINAL;  Surgeon: Lazaro Arms, MD;  Location: [facility];  Service: Gynecology;  Laterality: N/A;   VAGINAL PROLAPSE REPAIR N/A 08/24/2021   Procedure: VAGINAL VAULT SUSPENSION;  Surgeon: Lazaro Arms, MD;  Location: [facility];  Service: Gynecology;  Laterality: N/A;   The following portions of the patient's history were reviewed and updated as appropriate: allergies, current medications, past family history, past medical history, past social history, past surgical history and problem list.     Review of Systems:  Pertinent items noted in HPI and remainder of comprehensive ROS otherwise negative.  Physical Exam:  Physical exam deferred due to nature of the encounter  Labs and Imaging No results found for this or any previous visit (from the past 336 hour(s)). No results found.     No orders of the defined types were placed in this encounter.   No orders of the defined types were placed in this encounter.   Assessment and Plan:       ICD-10-CM    1. Dyspareunia, female  N94.10     2. 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 The patient was provided an opportunity to ask questions and all were answered. The patient agreed with the plan and demonstrated an understanding of the instructions.   The patient was advised to call back or seek an in-person evaluation/go to the ED if the symptoms worsen or if the condition fails to improve as anticipated.  I provided 10 minutes of non-face-to-face time during this encounter.   Lazaro Arms, MD [facility]

## 2021-05-11 ENCOUNTER — Encounter: Payer: Self-pay | Admitting: Obstetrics & Gynecology

## 2021-05-24 ENCOUNTER — Encounter: Payer: Self-pay | Admitting: Obstetrics & Gynecology

## 2021-06-02 ENCOUNTER — Other Ambulatory Visit: Payer: Self-pay | Admitting: Obstetrics & Gynecology

## 2021-06-02 DIAGNOSIS — Z01818 Encounter for other preprocedural examination: Secondary | ICD-10-CM

## 2021-06-02 NOTE — Patient Instructions (Signed)
? ? ? ? ? ? ? ? Samantha Sandoval ? 06/02/2021  ?  ? @PREFPERIOPPHARMACY @ ? ? Your procedure is scheduled on  06/08/2021. ? ? Report to 08/08/2021 at  0600 A.M. ? ? Call this number if you have problems the morning of surgery: ? [phone] ? ? Remember: ? Do not eat after midnight. ? ? You may drink clear liquids until  0330 AM .   ? ?Clear liquids allowed are:                    Water, Juice (non-citric and without pulp - diabetics please choose diet or no sugar options), Carbonated beverages - (diabetics please choose diet or no sugar options), Clear Tea, Black Coffee only (no creamer, milk or cream including half and half), Plain Jell-O only (diabetics please choose diet or no sugar options), Gatorade (diabetics please choose diet or no sugar options), and Plain Popsicles only ?  ? ?At 0330 Am on 06/08/2021, drink your carb drink. You can have nothing else to drink after this. ? ? ? Take these medicines the morning of surgery with A SIP OF WATER  ? ?lexapro, lorazepam. ?  ? Do not wear jewelry, make-up or nail polish. ? Do not wear lotions, powders, or perfumes, or deodorant. ? Do not shave 48 hours prior to surgery.  Men may shave face and neck. ? Do not bring valuables to the hospital. ? [facility] is not responsible for any belongings or valuables. ? ?Contacts, dentures or bridgework may not be worn into surgery.  Leave your suitcase in the car.  After surgery it may be brought to your room. ? ?For patients admitted to the hospital, discharge time will be determined by your treatment team. ? ?Patients discharged the day of surgery will not be allowed to drive home and must have someone with them for 24 hours.  ? ? ?Special instructions:   DO NOT smoke tobacco or vape for 24 hours before your procedure. ? ?Please read over the following fact sheets that you were given. ?Coughing and Deep Breathing, Surgical Site Infection Prevention, Anesthesia Post-op Instructions, and Care and Recovery After  Surgery ?  ? ? ? Vaginal Hysterectomy, Care After ?The following information offers guidance on how to care for yourself after your procedure. Your health care provider may also give you more specific instructions. If you have problems or questions, contact your health care provider. ?What can I expect after the procedure? ?After the procedure, it is common to have: ?Pain in the lower abdomen and vagina. ?Vaginal bleeding and discharge for up to 1 week. You will need to use a sanitary pad after this procedure. ?Difficulty having a bowel movement (constipation). ?Temporary problems emptying the bladder. ?Tiredness (fatigue). ?Poor appetite. ?Less interest in sex. ?Feelings of sadness or other emotions. ?If your ovaries were also removed, it is also common to have symptoms of menopause, such as hot flashes, night sweats, and lack of sleep (insomnia). ?Follow these instructions at home: ?Medicines ? ?Take over-the-counter and prescription medicines only as told by your health care provider. ?Do not take aspirin or NSAIDs, such as ibuprofen. These medicines can cause bleeding. ?Ask your health care provider if the medicine prescribed to you: ?Requires you to avoid driving or using machinery. ?Can cause constipation. You may need to take these actions to prevent or treat constipation: ?Drink enough fluid to keep your urine pale yellow. ?Take over-the-counter or prescription medicines. ?Eat foods that are high in  fiber, such as beans, whole grains, and fresh fruits and vegetables. ?Limit foods that are high in fat and processed sugars, such as fried or sweet foods. ?Activity ? ?Rest as told by your health care provider. ?Return to your normal activities as told by your health care provider. Ask your health care provider what activities are safe for you ?Avoid sitting for a long time without moving. Get up to take short walks every 1-2 hours. This is important to improve blood flow and breathing. Ask for help if you feel  weak or unsteady. ?Try to have someone home with you for 1-2 weeks to help you with everyday chores. ?Do not lift anything that is heavier than 10 lb (4.5 kg), or the limit that you are told, until your health care provider says that it is safe. ?If you were given a sedative during the procedure, it can affect you for several hours. Do not drive or operate machinery until your health care provider says that it is safe. ?Lifestyle ?Do not use any products that contain nicotine or tobacco. These products include cigarettes, chewing tobacco, and vaping devices, such as e-cigarettes. These can delay healing after surgery. If you need help quitting, ask your health care provider. ?Do not drink alcohol until your health care provider approves. ?General instructions ?Do not douche, use tampons, or have sex for at least 6 weeks, or as told by your health care provider. ?If you struggle with physical or emotional changes after your procedure, speak with your health care provider or a therapist. ?The stitches inside your vagina will dissolve over time and do not need to be taken out. ?Do not take baths, swim, or use a hot tub until your health care provider approves. You may only be allowed to take showers for 2-3 weeks ?Wear compression stockings as told by your health care provider. These stockings help to prevent blood clots and reduce swelling in your legs. ?Keep all follow-up visits. This is important. ?Contact a health care provider if: ?Your pain medicine is not helping. ?You have a fever. ?You have nausea or vomiting that does not go away. ?You feel dizzy. ?You have blood, pus, or a bad-smelling discharge from your vagina more than 1 week after the procedure. ?You continue to have trouble urinating 3-5 days after the procedure. ?Get help right away if: ?You have severe pain in your abdomen or back. ?You faint. ?You have heavy vaginal bleeding and blood clots, soaking through a sanitary pad in less than 1 hour. ?You  have chest pain or shortness of breath. ?You have pain, swelling, or redness in your leg. ?These symptoms may represent a serious problem that is an emergency. Do not wait to see if the symptoms will go away. Get medical help right away. Call your local emergency services (911 in the [place] Do not drive yourself to the hospital. ?Summary ?After the procedure, it is common to have pain, vaginal bleeding, constipation, temporary problems emptying your bladder, and feelings of sadness or other emotions. ?Take over-the-counter and prescription medicines only as told by your health care provider. ?Rest as told by your health care provider. Return to your normal activities as told by your health care provider. ?Contact a health care provider if your pain medicine is not helping, or you have a fever, dizziness, or trouble urinating several days after the procedure. ?Get help right away if you have severe pain in your abdomen or back, or if you faint, have heavy bleeding, or have chest  pain or shortness of breath. ?This information is not intended to replace advice given to you by your health care provider. Make sure you discuss any questions you have with your health care provider. ?Document Revised: 10/24/2019 Document Reviewed: 10/24/2019 ?Elsevier Patient Education ? 2022 [employer] ?General Anesthesia, Adult, Care After ?This sheet gives you information about how to care for yourself after your procedure. Your health care provider may also give you more specific instructions. If you have problems or questions, contact your health care provider. ?What can I expect after the procedure? ?After the procedure, the following side effects are common: ?Pain or discomfort at the IV site. ?Nausea. ?Vomiting. ?Sore throat. ?Trouble concentrating. ?Feeling cold or chills. ?Feeling weak or tired. ?Sleepiness and fatigue. ?Soreness and body aches. These side effects can affect parts of the body that were not involved in  surgery. ?Follow these instructions at home: ?For the time period you were told by your health care provider: ? ?Rest. ?Do not participate in activities where you could fall or become injured. ?Do not drive or use machinery. ?Do not d

## 2021-06-03 ENCOUNTER — Encounter (HOSPITAL_COMMUNITY)
Admission: RE | Admit: 2021-06-03 | Discharge: 2021-06-03 | Disposition: A | Discharge status: Home / Self Care | Payer: BC Managed Care – PPO | Source: Ambulatory Visit | Attending: Obstetrics & Gynecology | Admitting: Obstetrics & Gynecology

## 2021-06-03 ENCOUNTER — Encounter (HOSPITAL_COMMUNITY): Payer: Self-pay

## 2021-06-08 ENCOUNTER — Ambulatory Visit: Admit: 2021-06-08 | Payer: BC Managed Care – PPO | Admitting: Obstetrics & Gynecology

## 2021-06-08 SURGERY — HYSTERECTOMY, VAGINAL
Anesthesia: General

## 2021-06-17 ENCOUNTER — Encounter: Payer: BC Managed Care – PPO | Admitting: Obstetrics & Gynecology

## 2021-06-23 ENCOUNTER — Encounter: Payer: Self-pay | Admitting: Obstetrics & Gynecology

## 2021-06-30 ENCOUNTER — Telehealth: Payer: Self-pay | Admitting: Obstetrics & Gynecology

## 2021-06-30 NOTE — Telephone Encounter (Signed)
Patient called asking when would you be able to do surgery she has cancelled twice now. She is wanting to know if someone will call her and talk about it.  ?

## 2021-07-01 ENCOUNTER — Encounter: Payer: Self-pay | Admitting: Obstetrics & Gynecology

## 2021-07-05 DIAGNOSIS — Z6841 Body Mass Index (BMI) 40.0 and over, adult: Secondary | ICD-10-CM | POA: Diagnosis not present

## 2021-07-05 DIAGNOSIS — R7989 Other specified abnormal findings of blood chemistry: Secondary | ICD-10-CM | POA: Diagnosis not present

## 2021-07-05 DIAGNOSIS — Z029 Encounter for administrative examinations, unspecified: Secondary | ICD-10-CM

## 2021-07-05 DIAGNOSIS — Z0000 Encounter for general adult medical examination without abnormal findings: Secondary | ICD-10-CM | POA: Diagnosis not present

## 2021-07-05 DIAGNOSIS — F418 Other specified anxiety disorders: Secondary | ICD-10-CM | POA: Diagnosis not present

## 2021-07-05 DIAGNOSIS — R7303 Prediabetes: Secondary | ICD-10-CM | POA: Diagnosis not present

## 2021-07-14 DIAGNOSIS — J029 Acute pharyngitis, unspecified: Secondary | ICD-10-CM | POA: Diagnosis not present

## 2021-07-14 DIAGNOSIS — J020 Streptococcal pharyngitis: Secondary | ICD-10-CM | POA: Diagnosis not present

## 2021-07-18 IMAGING — US US PELVIS COMPLETE WITH TRANSVAGINAL
1 series · 14 of 25 positions shown · non-contrast
Comparison: 03/29/2018

CLINICAL DATA: Pelvic pain for 1 week, dyspareunia, history of
ovarian cyst, prior endometrial ablation. LMP 07/19/2020



[Series 1: us pelvic complete with transvaginal · 14 of 69 slices shown]
[im 1/69]
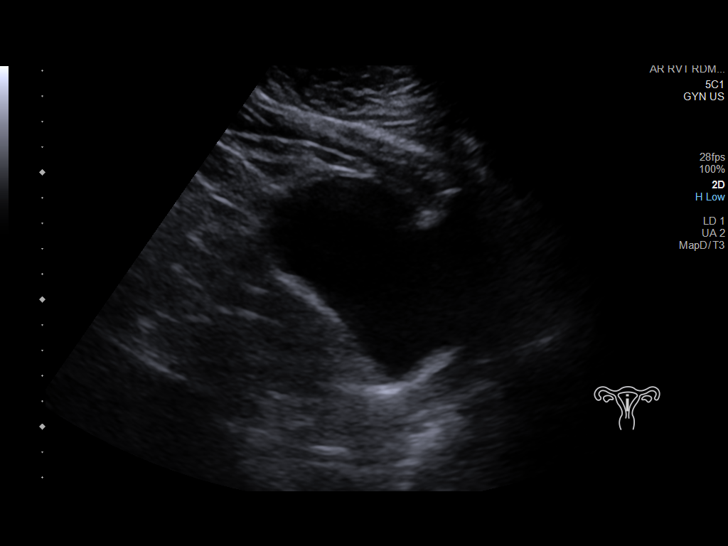
[im 6/69]
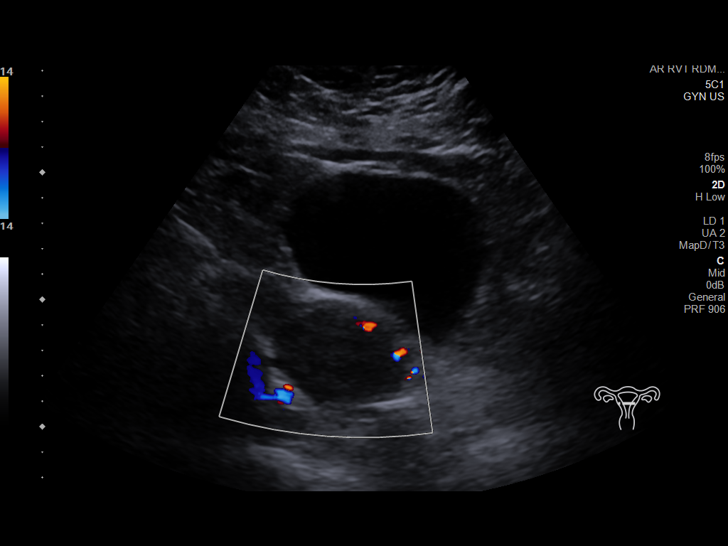
[im 12/69]
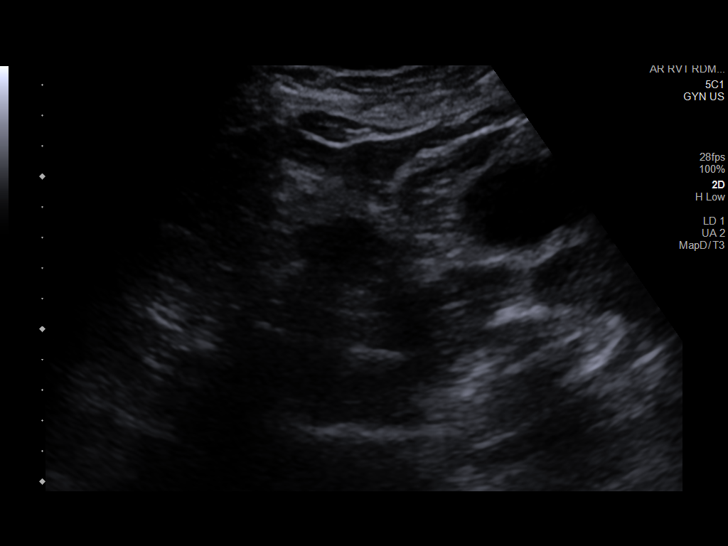
[im 18/69]
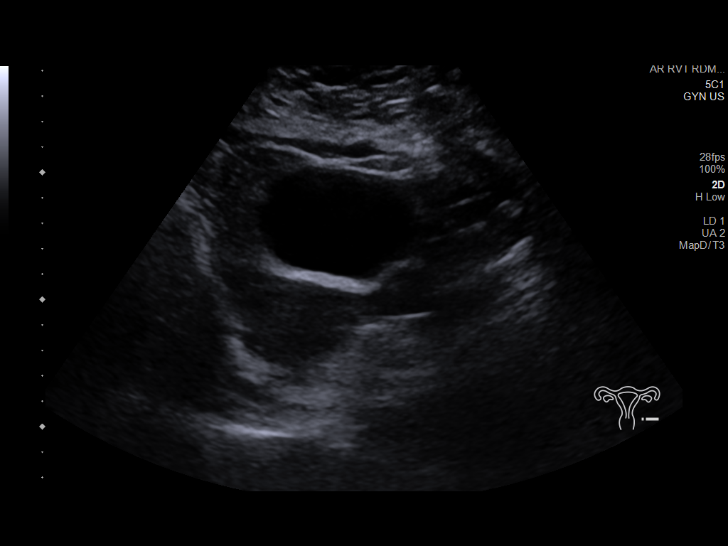
[im 23/69]
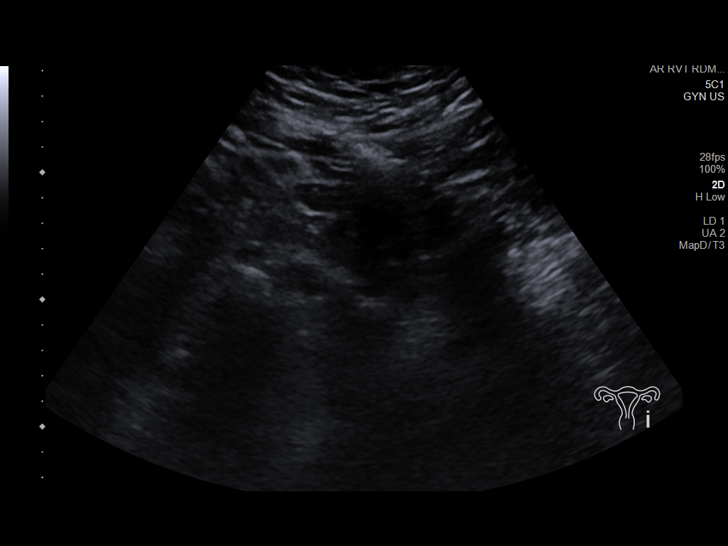
[im 26/69]
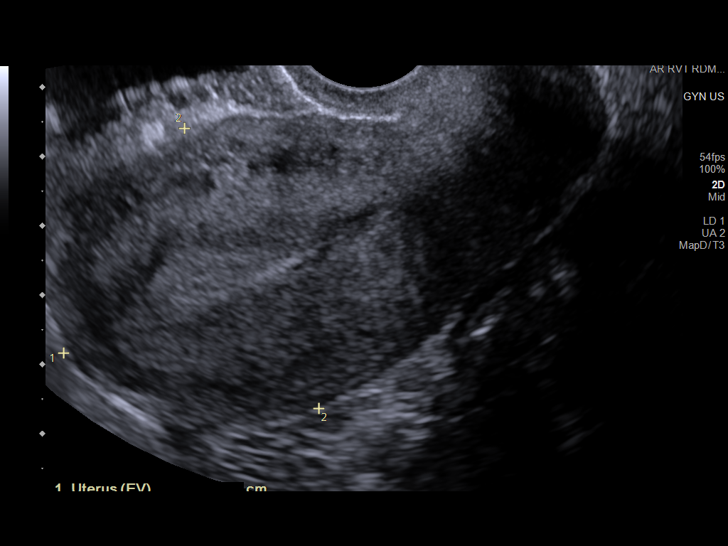
[im 32/69]
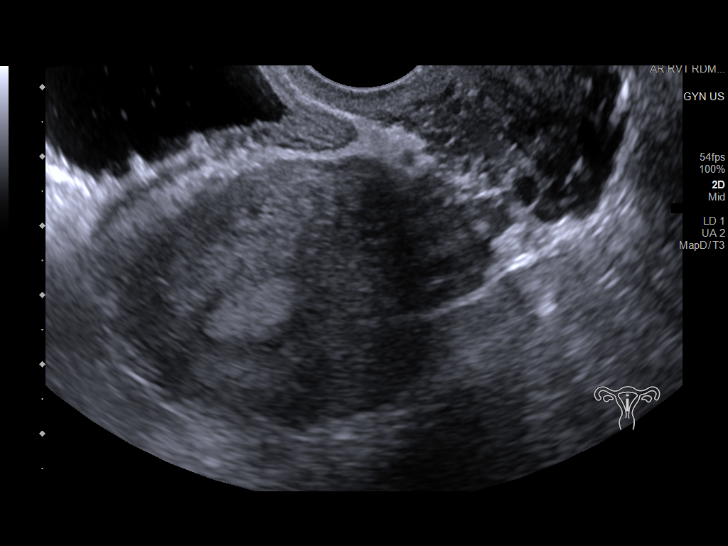
[im 37/69]
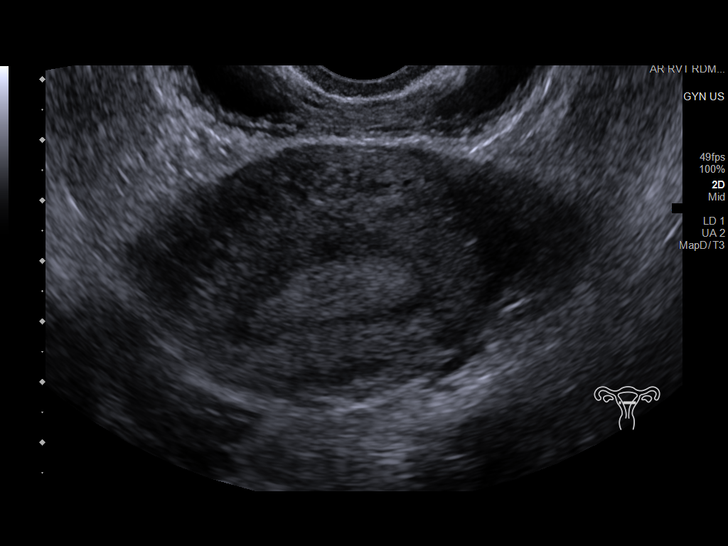
[im 43/69]
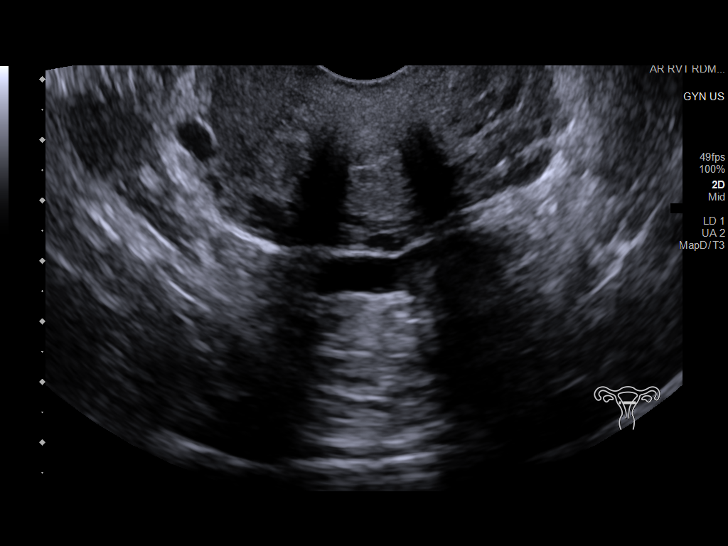
[im 46/69]
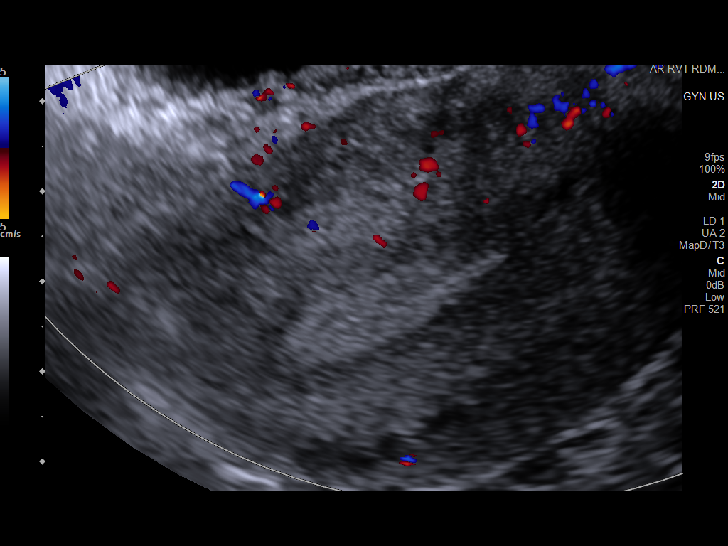
[im 52/69]
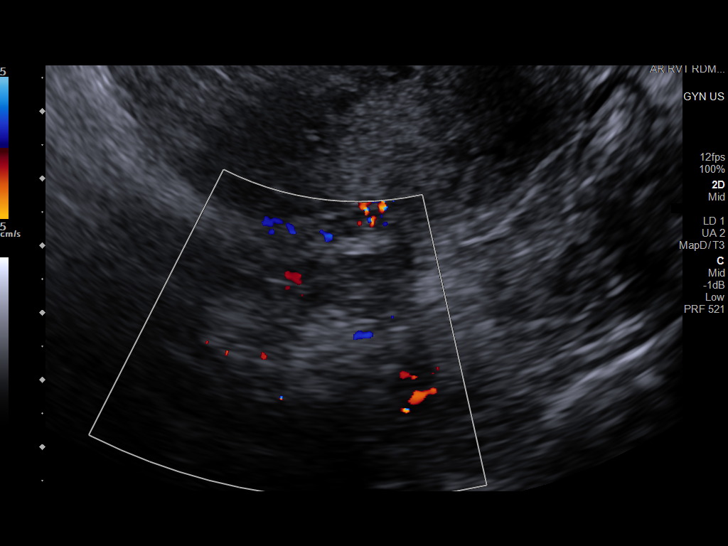
[im 57/69]
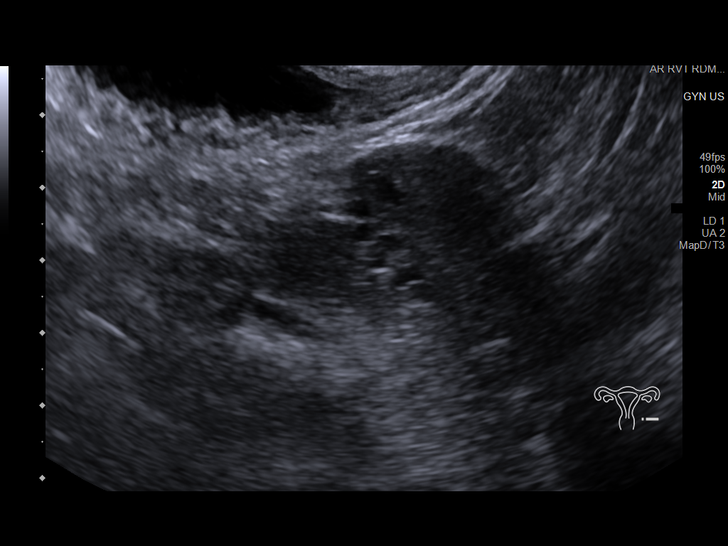
[im 63/69]
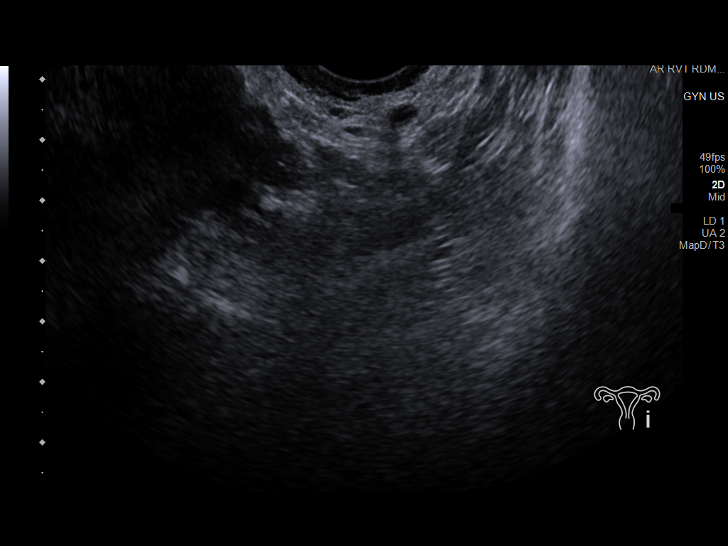
[im 69/69]
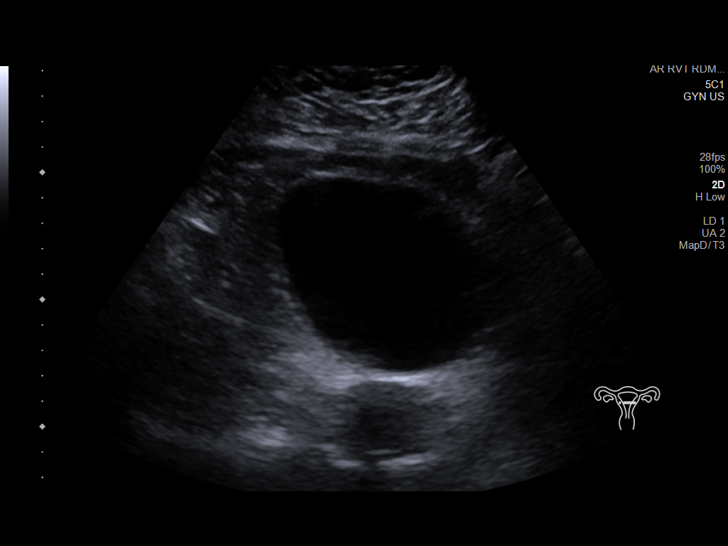

[14 of 25 positions shown; findings below may reference images not displayed]

FINDINGS: Uterus

Measurements: 8.5 x 4.5 x 5.4 cm = volume: 107 mL. Anteverted.
Normal morphology without mass

Endometrium

Thickness: 10 mm.  No endometrial fluid or focal abnormality.

Right ovary

Measurements: 3.1 x 1.6 x 1.4 cm = volume: 3.7 mL. Normal morphology
without mass

Left ovary

Measurements: 2.9 x 3.4 x 2.2 cm = volume: 11.1 mL. Normal
morphology without mass

Other findings

No free pelvic fluid.  No adnexal masses.
IMPRESSION: Normal exam.

## 2021-08-04 ENCOUNTER — Encounter: Payer: Self-pay | Admitting: Obstetrics & Gynecology

## 2021-08-18 NOTE — Patient Instructions (Signed)
Samantha Sandoval  08/18/2021     @PREFPERIOPPHARMACY @   Your procedure is scheduled on  08/24/2021.   Report to 08/26/2021 at  0700  A.M.   Call this number if you have problems the morning of surgery:  [phone]   Remember:  Do not eat after midnight.   You may drink clear liquids until  0430 AM on 08/24/2021 .     Clear liquids allowed are:                    Water, Juice (non-citric and without pulp - diabetics please choose diet or no sugar options), Carbonated beverages - (diabetics please choose diet or no sugar options), Clear Tea, Black Coffee only (no creamer, milk or cream including half and half), Plain Jell-O only (diabetics please choose diet or no sugar options), Gatorade (diabetics please choose diet or no sugar options), and Plain Popsicles only      At 0430 am on 6/21 drink your carb drink. You can have nothing else to drink after this.    Take these medicines the morning of surgery with A SIP OF WATER                         lexapro, ativan (If needed).     Do not wear jewelry, make-up or nail polish.  Do not wear lotions, powders, or perfumes, or deodorant.  Do not shave 48 hours prior to surgery.  Men may shave face and neck.  Do not bring valuables to the hospital.  [facility] is not responsible for any belongings or valuables.  Contacts, dentures or bridgework may not be worn into surgery.  Leave your suitcase in the car.  After surgery it may be brought to your room.  For patients admitted to the hospital, discharge time will be determined by your treatment team.  Patients discharged the day of surgery will not be allowed to drive home and must have someone with them for 24 hours.    Special instructions:   DO NOT smoke tobacco or vape for 24 hours before your procedure.  Please read over the following fact sheets that you were given. Pain Booklet, Coughing and Deep Breathing, Surgical Site Infection Prevention, Anesthesia Post-op  Instructions, and Care and Recovery After Surgery      Vaginal Hysterectomy, Care After The following information offers guidance on how to care for yourself after your procedure. Your health care provider may also give you more specific instructions. If you have problems or questions, contact your health care provider. What can I expect after the procedure? After the procedure, it is common to have: Pain in the lower abdomen and vagina. Vaginal bleeding and discharge for up to 1 week. You will need to use a sanitary pad after this procedure. Difficulty having a bowel movement (constipation). Temporary problems emptying the bladder. Tiredness (fatigue). Poor appetite. Less interest in sex. Feelings of sadness or other emotions. If your ovaries were also removed, it is also common to have symptoms of menopause, such as hot flashes, night sweats, and lack of sleep (insomnia). Follow these instructions at home: Medicines  Take over-the-counter and prescription medicines only as told by your health care provider. Do not take aspirin or NSAIDs, such as ibuprofen. These medicines can cause bleeding. Ask your health care provider if the medicine prescribed to you: Requires you to avoid driving or using machinery. Can cause  constipation. You may need to take these actions to prevent or treat constipation: Drink enough fluid to keep your urine pale yellow. Take over-the-counter or prescription medicines. Eat foods that are high in fiber, such as beans, whole grains, and fresh fruits and vegetables. Limit foods that are high in fat and processed sugars, such as fried or sweet foods. Activity  Rest as told by your health care provider. Return to your normal activities as told by your health care provider. Ask your health care provider what activities are safe for you Avoid sitting for a long time without moving. Get up to take short walks every 1-2 hours. This is important to improve blood flow  and breathing. Ask for help if you feel weak or unsteady. Try to have someone home with you for 1-2 weeks to help you with everyday chores. Do not lift anything that is heavier than 10 lb (4.5 kg), or the limit that you are told, until your health care provider says that it is safe. If you were given a sedative during the procedure, it can affect you for several hours. Do not drive or operate machinery until your health care provider says that it is safe. Lifestyle Do not use any products that contain nicotine or tobacco. These products include cigarettes, chewing tobacco, and vaping devices, such as e-cigarettes. These can delay healing after surgery. If you need help quitting, ask your health care provider. Do not drink alcohol until your health care provider approves. General instructions Do not douche, use tampons, or have sex for at least 6 weeks, or as told by your health care provider. If you struggle with physical or emotional changes after your procedure, speak with your health care provider or a therapist. The stitches inside your vagina will dissolve over time and do not need to be taken out. Do not take baths, swim, or use a hot tub until your health care provider approves. You may only be allowed to take showers for 2-3 weeks Wear compression stockings as told by your health care provider. These stockings help to prevent blood clots and reduce swelling in your legs. Keep all follow-up visits. This is important. Contact a health care provider if: Your pain medicine is not helping. You have a fever. You have nausea or vomiting that does not go away. You feel dizzy. You have blood, pus, or a bad-smelling discharge from your vagina more than 1 week after the procedure. You continue to have trouble urinating 3-5 days after the procedure. Get help right away if: You have severe pain in your abdomen or back. You faint. You have heavy vaginal bleeding and blood clots, soaking through a  sanitary pad in less than 1 hour. You have chest pain or shortness of breath. You have pain, swelling, or redness in your leg. These symptoms may represent a serious problem that is an emergency. Do not wait to see if the symptoms will go away. Get medical help right away. Call your local emergency services (911 in the [place] Do not drive yourself to the hospital. Summary After the procedure, it is common to have pain, vaginal bleeding, constipation, temporary problems emptying your bladder, and feelings of sadness or other emotions. Take over-the-counter and prescription medicines only as told by your health care provider. Rest as told by your health care provider. Return to your normal activities as told by your health care provider. Contact a health care provider if your pain medicine is not helping, or you have a fever,  dizziness, or trouble urinating several days after the procedure. Get help right away if you have severe pain in your abdomen or back, or if you faint, have heavy bleeding, or have chest pain or shortness of breath. This information is not intended to replace advice given to you by your health care provider. Make sure you discuss any questions you have with your health care provider. Document Revised: 10/24/2019 Document Reviewed: 10/24/2019 Elsevier Patient Education  2023 [employer] General Anesthesia, Adult, Care After This sheet gives you information about how to care for yourself after your procedure. Your health care provider may also give you more specific instructions. If you have problems or questions, contact your health care provider. What can I expect after the procedure? After the procedure, the following side effects are common: Pain or discomfort at the IV site. Nausea. Vomiting. Sore throat. Trouble concentrating. Feeling cold or chills. Feeling weak or tired. Sleepiness and fatigue. Soreness and body aches. These side effects can affect parts of the  body that were not involved in surgery. Follow these instructions at home: For the time period you were told by your health care provider:  Rest. Do not participate in activities where you could fall or become injured. Do not drive or use machinery. Do not drink alcohol. Do not take sleeping pills or medicines that cause drowsiness. Do not make important decisions or sign legal documents. Do not take care of children on your own. Eating and drinking Follow any instructions from your health care provider about eating or drinking restrictions. When you feel hungry, start by eating small amounts of foods that are soft and easy to digest (bland), such as toast. Gradually return to your regular diet. Drink enough fluid to keep your urine pale yellow. If you vomit, rehydrate by drinking water, juice, or clear broth. General instructions If you have sleep apnea, surgery and certain medicines can increase your risk for breathing problems. Follow instructions from your health care provider about wearing your sleep device: Anytime you are sleeping, including during daytime naps. While taking prescription pain medicines, sleeping medicines, or medicines that make you drowsy. Have a responsible adult stay with you for the time you are told. It is important to have someone help care for you until you are awake and alert. Return to your normal activities as told by your health care provider. Ask your health care provider what activities are safe for you. Take over-the-counter and prescription medicines only as told by your health care provider. If you smoke, do not smoke without supervision. Keep all follow-up visits as told by your health care provider. This is important. Contact a health care provider if: You have nausea or vomiting that does not get better with medicine. You cannot eat or drink without vomiting. You have pain that does not get better with medicine. You are unable to pass  urine. You develop a skin rash. You have a fever. You have redness around your IV site that gets worse. Get help right away if: You have difficulty breathing. You have chest pain. You have blood in your urine or stool, or you vomit blood. Summary After the procedure, it is common to have a sore throat or nausea. It is also common to feel tired. Have a responsible adult stay with you for the time you are told. It is important to have someone help care for you until you are awake and alert. When you feel hungry, start by eating small amounts of foods that  are soft and easy to digest (bland), such as toast. Gradually return to your regular diet. Drink enough fluid to keep your urine pale yellow. Return to your normal activities as told by your health care provider. Ask your health care provider what activities are safe for you. This information is not intended to replace advice given to you by your health care provider. Make sure you discuss any questions you have with your health care provider. Document Revised: 11/06/2019 Document Reviewed: 06/05/2019 Elsevier Patient Education  2023 Elsevier Inc. How to Use Chlorhexidine for Bathing Chlorhexidine gluconate (CHG) is a germ-killing (antiseptic) solution that is used to clean the skin. It can get rid of the bacteria that normally live on the skin and can keep them away for about 24 hours. To clean your skin with CHG, you may be given: A CHG solution to use in the shower or as part of a sponge bath. A prepackaged cloth that contains CHG. Cleaning your skin with CHG may help lower the risk for infection: While you are staying in the intensive care unit of the hospital. If you have a vascular access, such as a central line, to provide short-term or long-term access to your veins. If you have a catheter to drain urine from your bladder. If you are on a ventilator. A ventilator is a machine that helps you breathe by moving air in and out of your  lungs. After surgery. What are the risks? Risks of using CHG include: A skin reaction. Hearing loss, if CHG gets in your ears and you have a perforated eardrum. Eye injury, if CHG gets in your eyes and is not rinsed out. The CHG product catching fire. Make sure that you avoid smoking and flames after applying CHG to your skin. Do not use CHG: If you have a chlorhexidine allergy or have previously reacted to chlorhexidine. On babies younger than 5 months of age. How to use CHG solution Use CHG only as told by your health care provider, and follow the instructions on the label. Use the full amount of CHG as directed. Usually, this is one bottle. During a shower Follow these steps when using CHG solution during a shower (unless your health care provider gives you different instructions): Start the shower. Use your normal soap and shampoo to wash your face and hair. Turn off the shower or move out of the shower stream. Pour the CHG onto a clean washcloth. Do not use any type of brush or rough-edged sponge. Starting at your neck, lather your body down to your toes. Make sure you follow these instructions: If you will be having surgery, pay special attention to the part of your body where you will be having surgery. Scrub this area for at least 1 minute. Do not use CHG on your head or face. If the solution gets into your ears or eyes, rinse them well with water. Avoid your genital area. Avoid any areas of skin that have broken skin, cuts, or scrapes. Scrub your back and under your arms. Make sure to wash skin folds. Let the lather sit on your skin for 1-2 minutes or as long as told by your health care provider. Thoroughly rinse your entire body in the shower. Make sure that all body creases and crevices are rinsed well. Dry off with a clean towel. Do not put any substances on your body afterward--such as powder, lotion, or perfume--unless you are told to do so by your health care provider.  Only use lotions  that are recommended by the manufacturer. Put on clean clothes or pajamas. If it is the night before your surgery, sleep in clean sheets.  During a sponge bath Follow these steps when using CHG solution during a sponge bath (unless your health care provider gives you different instructions): Use your normal soap and shampoo to wash your face and hair. Pour the CHG onto a clean washcloth. Starting at your neck, lather your body down to your toes. Make sure you follow these instructions: If you will be having surgery, pay special attention to the part of your body where you will be having surgery. Scrub this area for at least 1 minute. Do not use CHG on your head or face. If the solution gets into your ears or eyes, rinse them well with water. Avoid your genital area. Avoid any areas of skin that have broken skin, cuts, or scrapes. Scrub your back and under your arms. Make sure to wash skin folds. Let the lather sit on your skin for 1-2 minutes or as long as told by your health care provider. Using a different clean, wet washcloth, thoroughly rinse your entire body. Make sure that all body creases and crevices are rinsed well. Dry off with a clean towel. Do not put any substances on your body afterward--such as powder, lotion, or perfume--unless you are told to do so by your health care provider. Only use lotions that are recommended by the manufacturer. Put on clean clothes or pajamas. If it is the night before your surgery, sleep in clean sheets. How to use CHG prepackaged cloths Only use CHG cloths as told by your health care provider, and follow the instructions on the label. Use the CHG cloth on clean, dry skin. Do not use the CHG cloth on your head or face unless your health care provider tells you to. When washing with the CHG cloth: Avoid your genital area. Avoid any areas of skin that have broken skin, cuts, or scrapes. Before surgery Follow these steps when using a  CHG cloth to clean before surgery (unless your health care provider gives you different instructions): Using the CHG cloth, vigorously scrub the part of your body where you will be having surgery. Scrub using a back-and-forth motion for 3 minutes. The area on your body should be completely wet with CHG when you are done scrubbing. Do not rinse. Discard the cloth and let the area air-dry. Do not put any substances on the area afterward, such as powder, lotion, or perfume. Put on clean clothes or pajamas. If it is the night before your surgery, sleep in clean sheets.  For general bathing Follow these steps when using CHG cloths for general bathing (unless your health care provider gives you different instructions). Use a separate CHG cloth for each area of your body. Make sure you wash between any folds of skin and between your fingers and toes. Wash your body in the following order, switching to a new cloth after each step: The front of your neck, shoulders, and chest. Both of your arms, under your arms, and your hands. Your stomach and groin area, avoiding the genitals. Your right leg and foot. Your left leg and foot. The back of your neck, your back, and your buttocks. Do not rinse. Discard the cloth and let the area air-dry. Do not put any substances on your body afterward--such as powder, lotion, or perfume--unless you are told to do so by your health care provider. Only use lotions that are recommended  by the manufacturer. Put on clean clothes or pajamas. Contact a health care provider if: Your skin gets irritated after scrubbing. You have questions about using your solution or cloth. You swallow any chlorhexidine. Call your local poison control center [phone] in the [place] Get help right away if: Your eyes itch badly, or they become very red or swollen. Your skin itches badly and is red or swollen. Your hearing changes. You have trouble seeing. You have swelling or tingling in  your mouth or throat. You have trouble breathing. These symptoms may represent a serious problem that is an emergency. Do not wait to see if the symptoms will go away. Get medical help right away. Call your local emergency services (911 in the [place] Do not drive yourself to the hospital. Summary Chlorhexidine gluconate (CHG) is a germ-killing (antiseptic) solution that is used to clean the skin. Cleaning your skin with CHG may help to lower your risk for infection. You may be given CHG to use for bathing. It may be in a bottle or in a prepackaged cloth to use on your skin. Carefully follow your health care provider's instructions and the instructions on the product label. Do not use CHG if you have a chlorhexidine allergy. Contact your health care provider if your skin gets irritated after scrubbing. This information is not intended to replace advice given to you by your health care provider. Make sure you discuss any questions you have with your health care provider. Document Revised: 05/03/2020 Document Reviewed: 05/03/2020 Elsevier Patient Education  2023 [employer]

## 2021-08-19 ENCOUNTER — Other Ambulatory Visit: Payer: Self-pay | Admitting: Obstetrics & Gynecology

## 2021-08-19 DIAGNOSIS — Z01818 Encounter for other preprocedural examination: Secondary | ICD-10-CM

## 2021-08-22 ENCOUNTER — Encounter (HOSPITAL_COMMUNITY): Payer: Self-pay

## 2021-08-22 ENCOUNTER — Encounter (HOSPITAL_COMMUNITY)
Admission: RE | Admit: 2021-08-22 | Discharge: 2021-08-22 | Disposition: A | Discharge status: Home / Self Care | Payer: BC Managed Care – PPO | Source: Ambulatory Visit | Attending: Obstetrics & Gynecology | Admitting: Obstetrics & Gynecology

## 2021-08-22 DIAGNOSIS — Z3202 Encounter for pregnancy test, result negative: Secondary | ICD-10-CM | POA: Insufficient documentation

## 2021-08-22 DIAGNOSIS — Z01818 Encounter for other preprocedural examination: Secondary | ICD-10-CM | POA: Insufficient documentation

## 2021-08-22 DIAGNOSIS — E0590 Thyrotoxicosis, unspecified without thyrotoxic crisis or storm: Secondary | ICD-10-CM | POA: Diagnosis not present

## 2021-08-22 DIAGNOSIS — F419 Anxiety disorder, unspecified: Secondary | ICD-10-CM | POA: Diagnosis not present

## 2021-08-22 DIAGNOSIS — N814 Uterovaginal prolapse, unspecified: Secondary | ICD-10-CM | POA: Diagnosis present

## 2021-08-22 DIAGNOSIS — Z87891 Personal history of nicotine dependence: Secondary | ICD-10-CM | POA: Diagnosis not present

## 2021-08-22 DIAGNOSIS — N941 Unspecified dyspareunia: Secondary | ICD-10-CM | POA: Diagnosis not present

## 2021-08-22 DIAGNOSIS — N72 Inflammatory disease of cervix uteri: Secondary | ICD-10-CM | POA: Diagnosis not present

## 2021-08-22 HISTORY — DX: Thyrotoxicosis, unspecified without thyrotoxic crisis or storm: E05.90

## 2021-08-22 LAB — URINALYSIS, ROUTINE W REFLEX MICROSCOPIC
Bilirubin Urine: NEGATIVE
Glucose, UA: NEGATIVE mg/dL
Hgb urine dipstick: NEGATIVE
Ketones, ur: NEGATIVE mg/dL
Nitrite: NEGATIVE
Protein, ur: NEGATIVE mg/dL
Specific Gravity, Urine: 1.01 (ref 1.005–1.030)
pH: 7 (ref 5.0–8.0)

## 2021-08-22 LAB — COMPREHENSIVE METABOLIC PANEL
ALT: 19 U/L (ref 0–44)
AST: 16 U/L (ref 15–41)
Albumin: 4 g/dL (ref 3.5–5.0)
Alkaline Phosphatase: 68 U/L (ref 38–126)
Anion gap: 6 (ref 5–15)
BUN: 9 mg/dL (ref 6–20)
CO2: 26 mmol/L (ref 22–32)
Calcium: 9.3 mg/dL (ref 8.9–10.3)
Chloride: 107 mmol/L (ref 98–111)
Creatinine, Ser: 0.68 mg/dL (ref 0.44–1.00)
GFR, Estimated: >60 mL/min (ref >60)
Glucose, Bld: 80 mg/dL (ref 70–99)
Potassium: 4 mmol/L (ref 3.5–5.1)
Sodium: 139 mmol/L (ref 135–145)
Total Bilirubin: 0.3 mg/dL (ref 0.3–1.2)
Total Protein: 7.1 g/dL (ref 6.5–8.1)

## 2021-08-22 LAB — CBC
HCT: 42 % (ref 36.0–46.0)
Hemoglobin: 13.7 g/dL (ref 12.0–15.0)
MCH: 28.4 pg (ref 26.0–34.0)
MCHC: 32.6 g/dL (ref 30.0–36.0)
MCV: 87 fL (ref 80.0–100.0)
Platelets: 294 10*3/uL (ref 150–400)
RBC: 4.83 MIL/uL (ref 3.87–5.11)
RDW: 12.8 % (ref 11.5–15.5)
WBC: 7.2 10*3/uL (ref 4.0–10.5)
nRBC: 0 % (ref 0.0–0.2)

## 2021-08-22 LAB — RAPID HIV SCREEN (HIV 1/2 AB+AG)
HIV 1/2 Antibodies: NONREACTIVE
HIV-1 P24 Antigen - HIV24: NONREACTIVE

## 2021-08-22 LAB — POCT PREGNANCY, URINE: Preg Test, Ur: NEGATIVE

## 2021-08-23 LAB — TYPE AND SCREEN
ABO/RH(D): O POS
Antibody Screen: NEGATIVE

## 2021-08-24 ENCOUNTER — Other Ambulatory Visit: Payer: Self-pay

## 2021-08-24 ENCOUNTER — Encounter: Payer: Self-pay | Admitting: Obstetrics & Gynecology

## 2021-08-24 ENCOUNTER — Ambulatory Visit (HOSPITAL_COMMUNITY)
Admission: RE | Admit: 2021-08-24 | Discharge: 2021-08-24 | Disposition: A | Discharge status: Home / Self Care | Payer: BC Managed Care – PPO | Attending: Obstetrics & Gynecology | Admitting: Obstetrics & Gynecology

## 2021-08-24 ENCOUNTER — Encounter (HOSPITAL_COMMUNITY): Payer: Self-pay | Admitting: Obstetrics & Gynecology

## 2021-08-24 ENCOUNTER — Ambulatory Visit (HOSPITAL_COMMUNITY): Payer: BC Managed Care – PPO | Admitting: Anesthesiology

## 2021-08-24 ENCOUNTER — Encounter (HOSPITAL_COMMUNITY)
Admission: RE | Disposition: A | Discharge status: Home / Self Care | Payer: Self-pay | Source: Home / Self Care | Attending: Obstetrics & Gynecology

## 2021-08-24 ENCOUNTER — Other Ambulatory Visit: Payer: Self-pay | Admitting: Adult Health

## 2021-08-24 ENCOUNTER — Other Ambulatory Visit: Payer: Self-pay | Admitting: Obstetrics & Gynecology

## 2021-08-24 DIAGNOSIS — N939 Abnormal uterine and vaginal bleeding, unspecified: Secondary | ICD-10-CM

## 2021-08-24 DIAGNOSIS — N812 Incomplete uterovaginal prolapse: Secondary | ICD-10-CM

## 2021-08-24 DIAGNOSIS — N8110 Cystocele, unspecified: Secondary | ICD-10-CM

## 2021-08-24 DIAGNOSIS — N72 Inflammatory disease of cervix uteri: Secondary | ICD-10-CM | POA: Diagnosis not present

## 2021-08-24 DIAGNOSIS — R6882 Decreased libido: Secondary | ICD-10-CM

## 2021-08-24 DIAGNOSIS — R102 Pelvic and perineal pain: Secondary | ICD-10-CM

## 2021-08-24 DIAGNOSIS — Z713 Dietary counseling and surveillance: Secondary | ICD-10-CM

## 2021-08-24 DIAGNOSIS — N941 Unspecified dyspareunia: Secondary | ICD-10-CM

## 2021-08-24 DIAGNOSIS — F419 Anxiety disorder, unspecified: Secondary | ICD-10-CM | POA: Insufficient documentation

## 2021-08-24 DIAGNOSIS — Z8742 Personal history of other diseases of the female genital tract: Secondary | ICD-10-CM

## 2021-08-24 DIAGNOSIS — N814 Uterovaginal prolapse, unspecified: Secondary | ICD-10-CM | POA: Diagnosis not present

## 2021-08-24 DIAGNOSIS — Z6841 Body Mass Index (BMI) 40.0 and over, adult: Secondary | ICD-10-CM

## 2021-08-24 DIAGNOSIS — E0590 Thyrotoxicosis, unspecified without thyrotoxic crisis or storm: Secondary | ICD-10-CM | POA: Insufficient documentation

## 2021-08-24 DIAGNOSIS — Z87891 Personal history of nicotine dependence: Secondary | ICD-10-CM | POA: Insufficient documentation

## 2021-08-24 DIAGNOSIS — Z8041 Family history of malignant neoplasm of ovary: Secondary | ICD-10-CM

## 2021-08-24 HISTORY — PX: VAGINAL HYSTERECTOMY: SHX2639

## 2021-08-24 HISTORY — PX: VAGINAL PROLAPSE REPAIR: SHX830

## 2021-08-24 SURGERY — HYSTERECTOMY, VAGINAL
Anesthesia: General | Site: Vagina

## 2021-08-24 MED ORDER — SCOPOLAMINE 1 MG/3DAYS TD PT72SCOPOLAMINE 1 MG/3DAYS
1.0000 | MEDICATED_PATCH | Freq: Once | TRANSDERMAL | Status: DC
Start: 2021-08-24 — End: 2021-08-24
  Administered 2021-08-24: 1.5 mg via TRANSDERMAL
  Filled 2021-08-24: qty 1

## 2021-08-24 MED ORDER — FENTANYL CITRATE (PF) 250 MCG/5ML IJ SOLN
INTRAMUSCULAR | Status: DC | PRN
  Administered 2021-08-24 (×2): 50 ug via INTRAVENOUS
  Administered 2021-08-24: 100 ug via INTRAVENOUS
  Administered 2021-08-24: 50 ug via INTRAVENOUS

## 2021-08-24 MED ORDER — ONDANSETRON HCL 8 MG PO TABS: 8.0000 mg | ORAL_TABLET | Freq: Three times a day (TID) | ORAL | 0 refills | Status: DC | PRN

## 2021-08-24 MED ORDER — MIDAZOLAM HCL 2 MG/2ML IJ SOLN
2.0000 mg | Freq: Once | INTRAMUSCULAR | Status: AC
  Administered 2021-08-24: 2 mg via INTRAVENOUS

## 2021-08-24 MED ORDER — PROMETHAZINE HCL 25 MG PO TABS: 25.0000 mg | ORAL_TABLET | Freq: Four times a day (QID) | ORAL | 1 refills | Status: DC | PRN

## 2021-08-24 MED ORDER — KETOROLAC TROMETHAMINE 30 MG/ML IJ SOLN
30.0000 mg | Freq: Once | INTRAMUSCULAR | Status: AC
  Administered 2021-08-24: 30 mg via INTRAVENOUS
  Filled 2021-08-24: qty 1

## 2021-08-24 MED ORDER — POVIDONE-IODINE 10 % EX SWAB: 2.0000 | Freq: Once | CUTANEOUS | Status: DC

## 2021-08-24 MED ORDER — LIDOCAINE 2% (20 MG/ML) 5 ML SYRINGE
INTRAMUSCULAR | Status: DC | PRN
  Administered 2021-08-24: 100 mg via INTRAVENOUS

## 2021-08-24 MED ORDER — LACTATED RINGERS IV SOLN
INTRAVENOUS | Status: DC
  Administered 2021-08-24: 1000 mL via INTRAVENOUS

## 2021-08-24 MED ORDER — STERILE WATER FOR IRRIGATION IR SOLN
Status: DC | PRN
  Administered 2021-08-24: 1000 mL

## 2021-08-24 MED ORDER — ROCURONIUM BROMIDE 10 MG/ML (PF) SYRINGE
PREFILLED_SYRINGE | INTRAVENOUS | Status: DC | PRN
  Administered 2021-08-24: 60 mg via INTRAVENOUS
  Administered 2021-08-24: 20 mg via INTRAVENOUS
  Administered 2021-08-24: 10 mg via INTRAVENOUS

## 2021-08-24 MED ORDER — KETOROLAC TROMETHAMINE 30 MG/ML IJ SOLN
30.0000 mg | Freq: Once | INTRAMUSCULAR | Status: AC
  Administered 2021-08-24: 30 mg via INTRAMUSCULAR
  Filled 2021-08-24: qty 1

## 2021-08-24 MED ORDER — SUGAMMADEX SODIUM 200 MG/2ML IV SOLN
INTRAVENOUS | Status: DC | PRN
  Administered 2021-08-24: 200 mg via INTRAVENOUS

## 2021-08-24 MED ORDER — HYDROMORPHONE HCL 1 MG/ML IJ SOLN
INTRAMUSCULAR | Status: AC
  Filled 2021-08-24: qty 0.5

## 2021-08-24 MED ORDER — SODIUM CHLORIDE 0.9 % IR SOLN
Status: DC | PRN
  Administered 2021-08-24: 3000 mL

## 2021-08-24 MED ORDER — CEFAZOLIN SODIUM-DEXTROSE 2-4 GM/100ML-% IV SOLN
2.0000 g | INTRAVENOUS | Status: AC
  Administered 2021-08-24: 2 g via INTRAVENOUS
  Filled 2021-08-24: qty 100

## 2021-08-24 MED ORDER — MIDAZOLAM HCL 5 MG/5ML IJ SOLN
INTRAMUSCULAR | Status: DC | PRN
  Administered 2021-08-24: 2 mg via INTRAVENOUS

## 2021-08-24 MED ORDER — MEPERIDINE HCL 50 MG/ML IJ SOLN: 6.2500 mg | INTRAMUSCULAR | Status: DC | PRN

## 2021-08-24 MED ORDER — FENTANYL CITRATE (PF) 250 MCG/5ML IJ SOLN
INTRAMUSCULAR | Status: AC
  Filled 2021-08-24: qty 5

## 2021-08-24 MED ORDER — ONDANSETRON HCL 4 MG/2ML IJ SOLN
INTRAMUSCULAR | Status: DC | PRN
  Administered 2021-08-24: 4 mg via INTRAVENOUS

## 2021-08-24 MED ORDER — PROPOFOL 10 MG/ML IV BOLUS
INTRAVENOUS | Status: DC | PRN
  Administered 2021-08-24: 200 mg via INTRAVENOUS

## 2021-08-24 MED ORDER — ACETAMINOPHEN 10 MG/ML IV SOLN
1000.0000 mg | Freq: Once | INTRAVENOUS | Status: AC
  Administered 2021-08-24: 1000 mg via INTRAVENOUS

## 2021-08-24 MED ORDER — MIDAZOLAM HCL 2 MG/2ML IJ SOLN
INTRAMUSCULAR | Status: AC
  Filled 2021-08-24: qty 2

## 2021-08-24 MED ORDER — KETOROLAC TROMETHAMINE 10 MG PO TABS: 10.0000 mg | ORAL_TABLET | Freq: Three times a day (TID) | ORAL | 0 refills | Status: DC | PRN

## 2021-08-24 MED ORDER — KETAMINE HCL 10 MG/ML IJ SOLN
INTRAMUSCULAR | Status: DC | PRN
  Administered 2021-08-24 (×2): 10 mg via INTRAVENOUS
  Administered 2021-08-24: 20 mg via INTRAVENOUS
  Administered 2021-08-24: 10 mg via INTRAVENOUS

## 2021-08-24 MED ORDER — OXYCODONE HCL 5 MG PO TABS
5.0000 mg | ORAL_TABLET | Freq: Four times a day (QID) | ORAL | Status: DC | PRN
  Filled 2021-08-24: qty 2

## 2021-08-24 MED ORDER — DEXAMETHASONE SODIUM PHOSPHATE 10 MG/ML IJ SOLN
INTRAMUSCULAR | Status: DC | PRN
  Administered 2021-08-24: 10 mg via INTRAVENOUS

## 2021-08-24 MED ORDER — BUPIVACAINE-EPINEPHRINE (PF) 0.5% -1:200000 IJ SOLN
INTRAMUSCULAR | Status: DC | PRN
  Administered 2021-08-24: 26 mL via PERINEURAL

## 2021-08-24 MED ORDER — METOCLOPRAMIDE HCL 5 MG/ML IJ SOLN
INTRAMUSCULAR | Status: DC | PRN
  Administered 2021-08-24: 10 mg via INTRAVENOUS

## 2021-08-24 MED ORDER — FENTANYL CITRATE PF 50 MCG/ML IJ SOSY: 50.0000 ug | PREFILLED_SYRINGE | INTRAMUSCULAR | Status: DC | PRN

## 2021-08-24 MED ORDER — BUPIVACAINE-EPINEPHRINE (PF) 0.5% -1:200000 IJ SOLN
INTRAMUSCULAR | Status: AC
  Filled 2021-08-24: qty 30

## 2021-08-24 MED ORDER — GABAPENTIN 300 MG PO CAPS: 300.0000 mg | ORAL_CAPSULE | Freq: Three times a day (TID) | ORAL | 0 refills | Status: DC

## 2021-08-24 MED ORDER — ONDANSETRON HCL 4 MG/2ML IJ SOLN: 4.0000 mg | Freq: Once | INTRAMUSCULAR | Status: DC | PRN

## 2021-08-24 MED ORDER — ACETAMINOPHEN 10 MG/ML IV SOLN
INTRAVENOUS | Status: AC
  Filled 2021-08-24: qty 100

## 2021-08-24 MED ORDER — OXYCODONE-ACETAMINOPHEN 7.5-325 MG PO TABS: 1.0000 | ORAL_TABLET | Freq: Four times a day (QID) | ORAL | 0 refills | Status: DC | PRN

## 2021-08-24 MED ORDER — PHENYLEPHRINE 80 MCG/ML (10ML) SYRINGE FOR IV PUSH (FOR BLOOD PRESSURE SUPPORT)
PREFILLED_SYRINGE | INTRAVENOUS | Status: DC | PRN
  Administered 2021-08-24: 160 ug via INTRAVENOUS
  Administered 2021-08-24: 80 ug via INTRAVENOUS

## 2021-08-24 MED ORDER — PROPOFOL 10 MG/ML IV BOLUS
INTRAVENOUS | Status: AC
  Filled 2021-08-24: qty 20

## 2021-08-24 MED ORDER — HYDROMORPHONE HCL 1 MG/ML IJ SOLN
0.5000 mg | INTRAMUSCULAR | Status: DC | PRN
  Administered 2021-08-24 (×2): 0.5 mg via INTRAVENOUS
  Filled 2021-08-24: qty 0.5

## 2021-08-24 MED ORDER — 0.9 % SODIUM CHLORIDE (POUR BTL) OPTIME
TOPICAL | Status: DC | PRN
  Administered 2021-08-24: 1000 mL

## 2021-08-24 MED ORDER — KETAMINE HCL 50 MG/5ML IJ SOSY
PREFILLED_SYRINGE | INTRAMUSCULAR | Status: AC
  Filled 2021-08-24: qty 5

## 2021-08-24 MED ORDER — LIDOCAINE HCL (PF) 2 % IJ SOLN
INTRAMUSCULAR | Status: AC
  Filled 2021-08-24: qty 5

## 2021-08-24 SURGICAL SUPPLY — 42 items
BAG HAMPER (MISCELLANEOUS) ×3 IMPLANT
CLOTH BEACON ORANGE TIMEOUT ST (SAFETY) ×3 IMPLANT
COVER LIGHT HANDLE STERIS (MISCELLANEOUS) ×6 IMPLANT
DRAPE HALF SHEET 40X57 (DRAPES) ×3 IMPLANT
DRAPE STERI URO 9X17 APER PCH (DRAPES) ×3 IMPLANT
ELECT REM PT RETURN 9FT ADLT (ELECTROSURGICAL) ×3
ELECTRODE REM PT RTRN 9FT ADLT (ELECTROSURGICAL) ×2 IMPLANT
GAUZE 4X4 16PLY ~~LOC~~+RFID DBL (SPONGE) ×3 IMPLANT
GLOVE BIO SURGEON STRL SZ7.5 (GLOVE) ×2 IMPLANT
GLOVE BIOGEL PI IND STRL 6.5 (GLOVE) ×1 IMPLANT
GLOVE BIOGEL PI IND STRL 7.0 (GLOVE) ×3 IMPLANT
GLOVE BIOGEL PI IND STRL 7.5 (GLOVE) ×1 IMPLANT
GLOVE BIOGEL PI IND STRL 8 (GLOVE) ×2 IMPLANT
GLOVE BIOGEL PI INDICATOR 6.5 (GLOVE) ×1
GLOVE BIOGEL PI INDICATOR 7.0 (GLOVE) ×1
GLOVE BIOGEL PI INDICATOR 7.5 (GLOVE) ×1
GLOVE BIOGEL PI INDICATOR 8 (GLOVE) ×1
GLOVE ECLIPSE 8.0 STRL XLNG CF (GLOVE) ×6 IMPLANT
GLOVE SRG 8 PF TXTR STRL LF DI (GLOVE) ×2 IMPLANT
GLOVE SS BIOGEL STRL SZ 6.5 (GLOVE) ×1 IMPLANT
GLOVE SUPERSENSE BIOGEL SZ 6.5 (GLOVE) ×1
GLOVE SURG UNDER POLY LF SZ8 (GLOVE) ×3
GOWN STRL REUS W/TWL LRG LVL3 (GOWN DISPOSABLE) ×6 IMPLANT
GOWN STRL REUS W/TWL XL LVL3 (GOWN DISPOSABLE) ×3 IMPLANT
IV NS IRRIG 3000ML ARTHROMATIC (IV SOLUTION) ×3 IMPLANT
KIT BLADEGUARD II DBL (SET/KITS/TRAYS/PACK) ×3 IMPLANT
KIT TURNOVER CYSTO (KITS) ×3 IMPLANT
KIT TURNOVER KIT A (KITS) ×3 IMPLANT
MANIFOLD NEPTUNE II (INSTRUMENTS) ×3 IMPLANT
NDL HYPO 21X1.5 SAFETY (NEEDLE) ×1 IMPLANT
NEEDLE HYPO 21X1.5 SAFETY (NEEDLE) ×3 IMPLANT
NS IRRIG 1000ML POUR BTL (IV SOLUTION) ×3 IMPLANT
PACK PERI GYN (CUSTOM PROCEDURE TRAY) ×3 IMPLANT
PAD ARMBOARD 7.5X6 YLW CONV (MISCELLANEOUS) ×3 IMPLANT
SET BASIN LINEN APH (SET/KITS/TRAYS/PACK) ×3 IMPLANT
SUT VIC AB 0 CT1 27 (SUTURE) ×9
SUT VIC AB 0 CT1 27XCR 8 STRN (SUTURE) ×5 IMPLANT
SYR CONTROL 10ML LL (SYRINGE) ×3 IMPLANT
TRAY FOLEY W/BAG SLVR 16FR (SET/KITS/TRAYS/PACK) ×3
TRAY FOLEY W/BAG SLVR 16FR ST (SET/KITS/TRAYS/PACK) ×2 IMPLANT
VERSALIGHT (MISCELLANEOUS) ×3 IMPLANT
WATER STERILE IRR 1000ML POUR (IV SOLUTION) ×3 IMPLANT

## 2021-08-24 NOTE — Anesthesia Procedure Notes (Signed)
Procedure Name: Intubation Date/Time: 08/24/2021 9:35 AM  Performed by: Myna Bright, CRNAPre-anesthesia Checklist: Patient identified, Emergency Drugs available, Patient being monitored and Suction available Patient Re-evaluated:Patient Re-evaluated prior to induction Oxygen Delivery Method: Circle system utilized Preoxygenation: Pre-oxygenation with 100% oxygen Induction Type: IV induction Ventilation: Mask ventilation without difficulty Laryngoscope Size: Mac and 3 Grade View: Grade I Tube type: Oral Tube size: 7.0 mm Number of attempts: 1 Airway Equipment and Method: Stylet Placement Confirmation: ETT inserted through vocal cords under direct vision, positive ETCO2 and breath sounds checked- equal and bilateral Secured at: 21 cm Tube secured with: Tape Dental Injury: Teeth and Oropharynx as per pre-operative assessment

## 2021-08-24 NOTE — Anesthesia Preprocedure Evaluation (Signed)
Anesthesia Evaluation  Patient identified by MRN, date of birth, ID band Patient awake    Reviewed: Allergy & Precautions, NPO status , Patient's Chart, lab work & pertinent test results  Airway Mallampati: II  TM Distance: >3 FB Neck ROM: Full    Dental  (+) Dental Advisory Given No notable dental injury:   Pulmonary former smoker,    Pulmonary exam normal breath sounds clear to auscultation       Cardiovascular Exercise Tolerance: Good negative cardio ROS Normal cardiovascular exam Rhythm:Regular Rate:Normal     Neuro/Psych PSYCHIATRIC DISORDERS Anxiety negative neurological ROS     GI/Hepatic negative GI ROS, Neg liver ROS,   Endo/Other  Hyperthyroidism   Renal/GU negative Renal ROS  negative genitourinary   Musculoskeletal negative musculoskeletal ROS (+)   Abdominal   Peds negative pediatric ROS (+)  Hematology negative hematology ROS (+)   Anesthesia Other Findings   Reproductive/Obstetrics negative OB ROS                            Anesthesia Physical Anesthesia Plan  ASA: 2  Anesthesia Plan: General   Post-op Pain Management:    Induction: Intravenous  PONV Risk Score and Plan: 4 or greater and Ondansetron, Dexamethasone, Midazolam and Scopolamine patch - Pre-op  Airway Management Planned: Oral ETT  Additional Equipment:   Intra-op Plan:   Post-operative Plan: Extubation in OR  Informed Consent: I have reviewed the patients History and Physical, chart, labs and discussed the procedure including the risks, benefits and alternatives for the proposed anesthesia with the patient or authorized representative who has indicated his/her understanding and acceptance.     Dental advisory given  Plan Discussed with: CRNA and Surgeon  Anesthesia Plan Comments: (Pain management as per Dr. Despina Hidden)       Anesthesia Quick Evaluation

## 2021-08-24 NOTE — Op Note (Signed)
Preoperative diagnosis:  1.  Grade 2 uterine prolapse                                         2.  Grade 2 cystocoele                                         3.  Dyspareunia(bump due to prolapse)                                         4.  Minimal SUI  Postoperative diagnosis:  Same as above   Procedure:  Vaginal hysterectomy with vaginal vault suspension  Surgeon:  Lazaro Arms MD  Anesthesia:  General Endotracheal  Findings:  Normal uterus tubes and ovaries. Once I removed the uterus and did the culdoplasty her cystocoele was improved and patient aware I wanted to avoid that at this point in her life due to her age    Description of operation:  The patient was taken from the preoperative area to the operating room in stable condition.  She underwent GET anesthesia  she was placed in the dorsal lithotomy position. Patient was prepped and draped in the usual sterile fashion and a Foley catheter was placed.  A weighted speculum was placed and the cervix was grasped with thyroid tenaculums both anteriorly and posteriorly.  0.5% Marcaine plain was injected in a circumferential fashion about the cervix and the electrocautery unit was used to incise the vagina and push at all cervix.  The posterior cul-de-sac was then entered sharply without difficulty.  The uterosacral ligaments were clamped cut and inspection suture ligated and held.  The cardinal ligaments were then clamped cut transfixion suture ligated and cut. The anterior peritoneum was identified the anterior cul-de-sac was entered sharply without difficulty. The anterior and posterior leaves of the broad ligament were plicated and the uterine vessels were clamped cut and suture ligated. Serial pedicles were taken of the fundus with each pedicle being clamped cut and suture ligated. The utero-ovarian ligaments were crossclamped the uterus was removed and both pedicles were transfixion suture ligated. There was good hemostasis of all the  pedicles.McCall culdoplasty was performed in the usual fashion with good vaginal apical support.  The peritoneum was then closed in a pursestring fashion using 3-0 Vicryl. The anterior posterior vagina was closed in interrupted fashion with good resultant hemostasis. Our closure the lower pelvis and vagina were irrigated vigorously.  The sponge needle and instrument counts were correct x 3.  Total blood loss for the procedure was 150 cc.  The patient received 2 g of Ancef and 30 mg of Toradol IV preoperatively prophylactically.  She was taken to the recovery room in good stable condition awake alert doing well.  Lazaro Arms 08/24/2021, 11:51 AM

## 2021-08-24 NOTE — H&P (Signed)
Preoperative History and Physical  Samantha Sandoval is a 26 y.o. [id number] with Patient's last menstrual period was 08/09/2021. admitted for a TVH anterior colporrhaphy vault suspension.   Note from 9/22: Pt has recently felt something falling when she walks and having intercourse Some pressure and associated discomfort Minimal urinary loss Some increased discharge No unusual constipation  With exam findings: General WDWN female NAD Vulva:  normal appearing vulva with no masses, tenderness or lesions Vagina:  normal mucosa, no discharge, grade 2 cystocoele, hypermobility with coughing and especially with standing Cervix:  Normal no lesions,Pap done Uterus:  normal size, contour, position, consistency, mobility, non-tender, Grade 2 relaxation, bladder is "in front" keeping it up better Adnexa: ovaries:present,  normal adnexa in size, nontender and no masses  PMH:    Past Medical History:  Diagnosis Date   Anxiety    Hyperthyroidism    Vaginal Pap smear, abnormal     PSH:     Past Surgical History:  Procedure Laterality Date   ABDOMINAL SURGERY     CERVICAL ABLATION N/A 03/28/2017   Procedure: Laser Ablation of Cervix;  Surgeon: Lazaro Arms, MD;  Location: [facility];  Service: Gynecology;  Laterality: N/A;   tubes in ears     tumor 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      POb/GynH:      OB History     Gravida  3   Para  2   Term  2   Preterm      AB  1   Living  2      SAB  1   IAB      Ectopic      Multiple  0   Live Births  2           SH:   Social History   Tobacco Use   Smoking status: Former    Packs/day: 0.25    Types: Cigarettes   Smokeless tobacco: Never  Vaping Use   Vaping Use: Never used  Substance Use Topics   Alcohol use: No    Comment: none   Drug use: No    Comment: denies    FH:    Family History  Problem Relation Age of Onset   Diabetes Maternal Grandmother    Hypertension Maternal Grandmother    COPD Maternal Grandmother    Scoliosis  Maternal Grandmother    Heart disease Maternal Grandmother    Hypertension Maternal Grandfather    Other Maternal Grandfather        2 stents in heart   Heart attack Paternal Grandfather    Congestive Heart Failure Neg Hx      Allergies:  Allergies  Allergen Reactions   Hibiclens [Chlorhexidine] Hives    Pt states that her skin burns.    Medications:       Current Facility-Administered Medications:    ceFAZolin (ANCEF) IVPB 2g/100 mL premix, 2 g, Intravenous, On Call to OR, Lazaro Arms, MD   lactated ringers infusion, , Intravenous, Continuous, Battula, Rajamani C, MD, Last Rate: 50 mL/hr at 08/24/21 0810, 1,000 mL at 08/24/21 0810   midazolam (VERSED) 2 MG/2ML injection, , , ,    povidone-iodine 10 % swab 2 Application, 2 Application, Topical, Once, Lazaro Arms, MD   scopolamine (TRANSDERM-SCOP) 1 MG/3DAYS 1.5 mg, 1 patch, Transdermal, Once, Battula, Rajamani C, MD, 1.5 mg at 08/24/21 0808  Review of Systems:   Review of Systems  Constitutional: Negative for fever, chills, weight loss, malaise/fatigue and  diaphoresis.  HENT: Negative for hearing loss, ear pain, nosebleeds, congestion, sore throat, neck pain, tinnitus and ear discharge.   Eyes: Negative for blurred vision, double vision, photophobia, pain, discharge and redness.  Respiratory: Negative for cough, hemoptysis, sputum production, shortness of breath, wheezing and stridor.   Cardiovascular: Negative for chest pain, palpitations, orthopnea, claudication, leg swelling and PND.  Gastrointestinal: Positive for abdominal pain. Negative for heartburn, nausea, vomiting, diarrhea, constipation, blood in stool and melena.  Genitourinary: Negative for dysuria, urgency, frequency, hematuria and flank pain.  Musculoskeletal: Negative for myalgias, back pain, joint pain and falls.  Skin: Negative for itching and rash.  Neurological: Negative for dizziness, tingling, tremors, sensory change, speech change, focal weakness,  seizures, loss of consciousness, weakness and headaches.  Endo/Heme/Allergies: Negative for environmental allergies and polydipsia. Does not bruise/bleed easily.  Psychiatric/Behavioral: Negative for depression, suicidal ideas, hallucinations, memory loss and substance abuse. The patient is not nervous/anxious and does not have insomnia.      PHYSICAL EXAM:  Blood pressure 106/60, temperature 98.6 F (37 C), temperature source Oral, resp. rate (!) 21, last menstrual period 08/09/2021, SpO2 98 %.    Vitals reviewed. Constitutional: She is oriented to person, place, and time. She appears well-developed and well-nourished.  HENT:  Head: Normocephalic and atraumatic.  Right Ear: External ear normal.  Left Ear: External ear normal.  Nose: Nose normal.  Mouth/Throat: Oropharynx is clear and moist.  Eyes: Conjunctivae and EOM are normal. Pupils are equal, round, and reactive to light. Right eye exhibits no discharge. Left eye exhibits no discharge. No scleral icterus.  Neck: Normal range of motion. Neck supple. No tracheal deviation present. No thyromegaly present.  Cardiovascular: Normal rate, regular rhythm, normal heart sounds and intact distal pulses.  Exam reveals no gallop and no friction rub.   No murmur heard. Respiratory: Effort normal and breath sounds normal. No respiratory distress. She has no wheezes. She has no rales. She exhibits no tenderness.  GI: Soft. Bowel sounds are normal. She exhibits no distension and no mass. There is tenderness. There is no rebound and no guarding.  Genitourinary:        General WDWN female NAD Vulva:  normal appearing vulva with no masses, tenderness or lesions Vagina:  normal mucosa, no discharge, grade 2 cystocoele, hypermobility with coughing and especially with standing Cervix:  Normal no lesions,Pap done Uterus:  normal size, contour, position, consistency, mobility, non-tender, Grade 2 relaxation, bladder is "in front" keeping it up  better Adnexa: ovaries:present,  normal adnexa in size, nontender and no masses Musculoskeletal: Normal range of motion. She exhibits no edema and no tenderness.  Neurological: She is alert and oriented to person, place, and time. She has normal reflexes. She displays normal reflexes. No cranial nerve deficit. She exhibits normal muscle tone. Coordination normal.  Skin: Skin is warm and dry. No rash noted. No erythema. No pallor.  Psychiatric: She has a normal mood and affect. Her behavior is normal. Judgment and thought content normal.    Labs: Results for orders placed or performed during the hospital encounter of 08/22/21 (from the past 336 hour(s))  CBC   Collection Time: 08/22/21 10:50 AM  Result Value Ref Range   WBC 7.2 4.0 - 10.5 K/uL   RBC 4.83 3.87 - 5.11 MIL/uL   Hemoglobin 13.7 12.0 - 15.0 g/dL   HCT 42.0 36.0 - 46.0 %   MCV 87.0 80.0 - 100.0 fL   MCH 28.4 26.0 - 34.0 pg   MCHC 32.6 30.0 -  36.0 g/dL   RDW 12.8 11.5 - 15.5 %   Platelets 294 150 - 400 K/uL   nRBC 0.0 0.0 - 0.2 %  Comprehensive metabolic panel   Collection Time: 08/22/21 10:50 AM  Result Value Ref Range   Sodium 139 135 - 145 mmol/L   Potassium 4.0 3.5 - 5.1 mmol/L   Chloride 107 98 - 111 mmol/L   CO2 26 22 - 32 mmol/L   Glucose, Bld 80 70 - 99 mg/dL   BUN 9 6 - 20 mg/dL   Creatinine, Ser 0.68 0.44 - 1.00 mg/dL   Calcium 9.3 8.9 - 10.3 mg/dL   Total Protein 7.1 6.5 - 8.1 g/dL   Albumin 4.0 3.5 - 5.0 g/dL   AST 16 15 - 41 U/L   ALT 19 0 - 44 U/L   Alkaline Phosphatase 68 38 - 126 U/L   Total Bilirubin 0.3 0.3 - 1.2 mg/dL   GFR, Estimated >60 >60 mL/min   Anion gap 6 5 - 15  Rapid HIV screen (HIV 1/2 Ab+Ag)   Collection Time: 08/22/21 10:50 AM  Result Value Ref Range   HIV-1 P24 Antigen - HIV24 NON REACTIVE NON REACTIVE   HIV 1/2 Antibodies NON REACTIVE NON REACTIVE   Interpretation (HIV Ag Ab)      A non reactive test result means that HIV 1 or HIV 2 antibodies and HIV 1 p24 antigen were not  detected in the specimen.  Urinalysis, Routine w reflex microscopic Urine, Clean Catch   Collection Time: 08/22/21 10:50 AM  Result Value Ref Range   Color, Urine YELLOW YELLOW   APPearance CLEAR CLEAR   Specific Gravity, Urine 1.010 1.005 - 1.030   pH 7.0 5.0 - 8.0   Glucose, UA NEGATIVE NEGATIVE mg/dL   Hgb urine dipstick NEGATIVE NEGATIVE   Bilirubin Urine NEGATIVE NEGATIVE   Ketones, ur NEGATIVE NEGATIVE mg/dL   Protein, ur NEGATIVE NEGATIVE mg/dL   Nitrite NEGATIVE NEGATIVE   Leukocytes,Ua TRACE (A) NEGATIVE   RBC / HPF 0-5 0 - 5 RBC/hpf   WBC, UA 0-5 0 - 5 WBC/hpf   Bacteria, UA RARE (A) NONE SEEN   Squamous Epithelial / LPF 0-5 0 - 5  Type and screen   Collection Time: 08/22/21 10:50 AM  Result Value Ref Range   ABO/RH(D) O POS    Antibody Screen NEG    Sample Expiration 09/05/2021,2359    Extend sample reason      NO TRANSFUSIONS OR PREGNANCY IN THE PAST 3 MONTHS Performed at [facility], [address] [place], [place] [address]   Pregnancy, urine POC   Collection Time: 08/22/21 11:11 AM  Result Value Ref Range   Preg Test, Ur NEGATIVE NEGATIVE    EKG: Orders placed or performed during the hospital encounter of 04/22/16   ED EKG   ED EKG    Imaging Studies: No results found.    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 Patient desires for definitve surgical management  Plan: This was my note laste September, 2022  Pt really wants to have it "fixed" but we had a long conversation about it not being that simple at her age, where multiple lifetime repairs are likely   She adamantly does not want a pessary(her grandmother had one)   Really want to delay surgical repair as long as possible due to her age and the inevitable need to have multiple repairs in her lifetime   Will trial vaginal estrogen cream, Kegel exercises  and recommend weight loss all to try to mitigate the relaxation   The weight loss will be important even if repairs  done to extend the life of the repair  I saw the patient again 3/23 and she had decided she wanted repair  She understands I will try to avoid the cystocoele repair because of her age and need for multiple native tissue repairs, she loses urine infrequently and small volumes  She understands I will try to make the best decision I can for her based on her age and the lifetime issues with multiple repairs  Lazaro Arms 08/24/2021 8:48 AM

## 2021-08-24 NOTE — Transfer of Care (Signed)
Immediate Anesthesia Transfer of Care Note  Patient: Samantha Sandoval  Procedure(s) Performed: HYSTERECTOMY VAGINAL (Vagina ) VAGINAL VAULT SUSPENSION (Vagina )  Patient Location: PACU  Anesthesia Type:General  Level of Consciousness: awake, alert , oriented and patient cooperative  Airway & Oxygen Therapy: Patient Spontanous Breathing and Patient connected to nasal cannula oxygen  Post-op Assessment: Report given to RN, Post -op Vital signs reviewed and stable and Patient moving all extremities  Post vital signs: Reviewed and stable  Last Vitals:  Vitals Value Taken Time  BP 126/62 08/24/21 1148  Temp 37.1 C 08/24/21 1148  Pulse 88 08/24/21 1152  Resp 19 08/24/21 1152  SpO2 99 % 08/24/21 1152  Vitals shown include unvalidated device data.  Last Pain:  Vitals:   08/24/21 1148  TempSrc:   PainSc: Asleep      Patients Stated Pain Goal: 9 (08/24/21 0805)  Complications: No notable events documented.

## 2021-08-24 NOTE — Discharge Instructions (Signed)
Post Operative Pain Med Plan:  >Take gabapentin 300 mg three times per day, as prescribed for 4 days, try to space them evenly  >Take the percocet 1 tablet, on a schedule, around the clock, every 6 hours(set your phone alarm) for the first 2 days, there may     Be times when you will need 2 tablets but taking them on a schedule will decrease this need  >Then take the percocet on an as needed basis per the prescription instructions  >Take the ketorolac or toradol every 8 hours for the first 3 days then the remainder to supplement the percocet  >After the toradol is gone then use the ibuprofen as ordered as needed to help supplement the percocet  >Use a heating pad as well as needed  >Of course the zofran is available to take as prescribed when needed for nausea  >Be gentle with your diet the first few days, liquids and soft non spicy food, fruits are great  >Get up and move, no lifting or straining  Dr Despina Hidden

## 2021-08-24 NOTE — Progress Notes (Signed)
Spoke with Dr Despina Hidden, will rx percocet 7.5/325 mg 1 every 6 hour prn pain #28 is for post op pain. At [facility] in [place]

## 2021-08-24 NOTE — Anesthesia Postprocedure Evaluation (Signed)
Anesthesia Post Note  Patient: Samantha Sandoval  Procedure(s) Performed: HYSTERECTOMY VAGINAL (Vagina ) VAGINAL VAULT SUSPENSION (Vagina )  Patient location during evaluation: Phase II Anesthesia Type: General Level of consciousness: awake and alert and oriented Pain management: pain level controlled Vital Signs Assessment: post-procedure vital signs reviewed and stable Respiratory status: spontaneous breathing, nonlabored ventilation and respiratory function stable Cardiovascular status: blood pressure returned to baseline and stable Postop Assessment: no apparent nausea or vomiting Anesthetic complications: no   No notable events documented.   Last Vitals:  Vitals:   08/24/21 1315 08/24/21 1324  BP: (!) 98/56 107/60  Pulse: 72 68  Resp: 15 14  Temp:  36.9 C  SpO2: 94% 95%    Last Pain:  Vitals:   08/24/21 1324  TempSrc: Oral  PainSc: 5                  Joevon Holliman C Mervin Ramires

## 2021-08-25 LAB — SURGICAL PATHOLOGY

## 2021-08-26 ENCOUNTER — Encounter: Payer: Self-pay | Admitting: *Deleted

## 2021-08-29 ENCOUNTER — Encounter (HOSPITAL_COMMUNITY): Payer: Self-pay | Admitting: Obstetrics & Gynecology

## 2021-08-30 DIAGNOSIS — R002 Palpitations: Secondary | ICD-10-CM | POA: Diagnosis not present

## 2021-08-31 ENCOUNTER — Telehealth: Payer: Self-pay

## 2021-08-31 MED ORDER — ESCITALOPRAM OXALATE 20 MG PO TABS: 20.0000 mg | ORAL_TABLET | Freq: Every day | ORAL | 6 refills | Status: DC | PRN

## 2021-08-31 NOTE — Addendum Note (Signed)
Addended by: Cyril Mourning A on: 08/31/2021 05:18 PM   Modules accepted: Orders

## 2021-08-31 NOTE — Telephone Encounter (Signed)
Pt called and stated that she had surgery last week and since the surgery she is having rapid heart rate and wants to know she should do.  Would like for a nurse to call her.

## 2021-08-31 NOTE — Telephone Encounter (Signed)
Pt reports that yesterday she had elevated heart rate 140-160. She went to ER last night and they told her that everything checked out okay. She is having a lot of panic attacks. She takes Lexapro and Ativan to help with it. She is out of refills on both of these meds. She asked if Samantha Sandoval can refill them for her. Advised I would sent message to her.

## 2021-08-31 NOTE — Telephone Encounter (Signed)
Left message that I refilled lexapro, but not ativan, while taking percocet.

## 2021-09-07 DIAGNOSIS — M79661 Pain in right lower leg: Secondary | ICD-10-CM | POA: Diagnosis not present

## 2021-09-08 ENCOUNTER — Encounter: Payer: BC Managed Care – PPO | Admitting: Obstetrics & Gynecology

## 2021-09-12 ENCOUNTER — Ambulatory Visit (INDEPENDENT_AMBULATORY_CARE_PROVIDER_SITE_OTHER): Payer: BC Managed Care – PPO | Admitting: Obstetrics & Gynecology

## 2021-09-12 ENCOUNTER — Encounter: Payer: Self-pay | Admitting: Obstetrics & Gynecology

## 2021-09-12 VITALS — BP 97/67 | HR 74 | Ht 62.0 in | Wt 221.0 lb

## 2021-09-12 DIAGNOSIS — Z9889 Other specified postprocedural states: Secondary | ICD-10-CM

## 2021-09-12 NOTE — Progress Notes (Signed)
  HPI: Patient returns for routine postoperative follow-up having undergone TVH VV suspension on 08/24/21.  The patient's immediate postoperative recovery has been unremarkable. Since hospital discharge the patient reports no problems.   Current Outpatient Medications: busPIRone (BUSPAR) 5 MG tablet, Take 5 mg by mouth 2 (two) times daily., Disp: , Rfl:  escitalopram (LEXAPRO) 20 MG tablet, Take 1 tablet (20 mg total) by mouth daily as needed (anxiety)., Disp: 30 tablet, Rfl: 6 LORazepam (ATIVAN) 0.5 MG tablet, Take 0.5 mg by mouth daily as needed for anxiety., Disp: , Rfl:  Nutritional Supp - Diet Aids (FAT BURNER THERAPY PO), Take 1 tablet by mouth daily., Disp: , Rfl:  gabapentin (NEURONTIN) 300 MG capsule, Take 1 capsule (300 mg total) by mouth 3 (three) times daily. (Patient not taking: Reported on 09/12/2021), Disp: 12 capsule, Rfl: 0 ketorolac (TORADOL) 10 MG tablet, Take 1 tablet (10 mg total) by mouth every 8 (eight) hours as needed. (Patient not taking: Reported on 09/12/2021), Disp: 15 tablet, Rfl: 0 ondansetron (ZOFRAN) 8 MG tablet, Take 1 tablet (8 mg total) by mouth every 8 (eight) hours as needed for nausea. (Patient not taking: Reported on 09/12/2021), Disp: 12 tablet, Rfl: 0 oxyCODONE-acetaminophen (PERCOCET) 7.5-325 MG tablet, Take 1 tablet by mouth every 6 (six) hours as needed for severe pain. (Patient not taking: Reported on 09/12/2021), Disp: 28 tablet, Rfl: 0 promethazine (PHENERGAN) 25 MG tablet, Take 1 tablet (25 mg total) by mouth every 6 (six) hours as needed for nausea. (Patient not taking: Reported on 09/12/2021), Disp: 30 tablet, Rfl: 1  No current facility-administered medications for this visit.    Blood pressure 97/67, pulse 74, height 5\' 2"  (1.575 m), weight 221 lb (100.2 kg), last menstrual period 08/09/2021.  Physical Exam: Vagina cull well healed non tender Bimanual is normal  Diagnostic Tests:   Pathology: benign  Impression + Management plan:    ICD-10-CM   1. S/P TVH+ VV suspension:  Post-operative state  Z98.890    Normal post op exam, no post op complictions         Medications Prescribed this encounter: No orders of the defined types were placed in this encounter.     Follow up: Return in about 1 month (around 10/13/2021) for Post Op, with Dr 12/13/2021.    Despina Hidden, MD Attending Physician for the Center for [facility] and [facility] 09/12/2021 8:50 AM

## 2021-09-14 DIAGNOSIS — H1031 Unspecified acute conjunctivitis, right eye: Secondary | ICD-10-CM | POA: Diagnosis not present

## 2021-09-14 DIAGNOSIS — J029 Acute pharyngitis, unspecified: Secondary | ICD-10-CM | POA: Diagnosis not present

## 2021-09-14 DIAGNOSIS — Z20822 Contact with and (suspected) exposure to covid-19: Secondary | ICD-10-CM | POA: Diagnosis not present

## 2021-10-18 ENCOUNTER — Encounter: Payer: Self-pay | Admitting: Obstetrics & Gynecology

## 2021-10-18 ENCOUNTER — Ambulatory Visit (INDEPENDENT_AMBULATORY_CARE_PROVIDER_SITE_OTHER): Payer: BC Managed Care – PPO | Admitting: Obstetrics & Gynecology

## 2021-10-18 VITALS — BP 117/67 | HR 80 | Ht 62.0 in | Wt 226.0 lb

## 2021-10-18 DIAGNOSIS — Z9889 Other specified postprocedural states: Secondary | ICD-10-CM

## 2021-10-18 NOTE — Progress Notes (Signed)
  HPI: Patient returns for routine postoperative follow-up having undergone TVH vv suspension on 08/24/21.  The patient's immediate postoperative recovery has been unremarkable. Since hospital discharge the patient reports no problems.   Current Outpatient Medications: busPIRone (BUSPAR) 5 MG tablet, Take 5 mg by mouth 2 (two) times daily., Disp: , Rfl:  clindamycin (CLEOCIN) 150 MG capsule, Take 150 mg by mouth 3 (three) times daily., Disp: , Rfl:  escitalopram (LEXAPRO) 20 MG tablet, Take 1 tablet (20 mg total) by mouth daily as needed (anxiety)., Disp: 30 tablet, Rfl: 6 LORazepam (ATIVAN) 0.5 MG tablet, Take 0.5 mg by mouth daily as needed for anxiety., Disp: , Rfl:  pantoprazole (PROTONIX) 40 MG tablet, Take 40 mg by mouth every morning., Disp: , Rfl:  gabapentin (NEURONTIN) 300 MG capsule, Take 1 capsule (300 mg total) by mouth 3 (three) times daily. (Patient not taking: Reported on 09/12/2021), Disp: 12 capsule, Rfl: 0 ketorolac (TORADOL) 10 MG tablet, Take 1 tablet (10 mg total) by mouth every 8 (eight) hours as needed. (Patient not taking: Reported on 09/12/2021), Disp: 15 tablet, Rfl: 0 Nutritional Supp - Diet Aids (FAT BURNER THERAPY PO), Take 1 tablet by mouth daily., Disp: , Rfl:  ondansetron (ZOFRAN) 8 MG tablet, Take 1 tablet (8 mg total) by mouth every 8 (eight) hours as needed for nausea. (Patient not taking: Reported on 09/12/2021), Disp: 12 tablet, Rfl: 0 oxyCODONE-acetaminophen (PERCOCET) 7.5-325 MG tablet, Take 1 tablet by mouth every 6 (six) hours as needed for severe pain. (Patient not taking: Reported on 09/12/2021), Disp: 28 tablet, Rfl: 0 promethazine (PHENERGAN) 25 MG tablet, Take 1 tablet (25 mg total) by mouth every 6 (six) hours as needed for nausea. (Patient not taking: Reported on 09/12/2021), Disp: 30 tablet, Rfl: 1  No current facility-administered medications for this visit.    Blood pressure 117/67, pulse 80, height 5\' 2"  (1.575 m), weight 226 lb (102.5 kg), last  menstrual period 08/09/2021.  Physical Exam: Cuff is well healed good suspension Bimanual is normal  Diagnostic Tests:   Pathology: benign  Impression + Management plan:   ICD-10-CM   1. S/P TVH+ VV suspension:  Post-operative state  Z98.890          Medications Prescribed this encounter: No orders of the defined types were placed in this encounter.     Follow up: Return in about 1 year (around 10/19/2022) for Follow up.    10/21/2022, MD Attending Physician for the Center for [facility] and [facility] 10/18/2021 10:45 AM

## 2021-11-08 ENCOUNTER — Telehealth: Payer: Self-pay

## 2021-11-08 NOTE — Telephone Encounter (Signed)
Transition Care Management Unsuccessful Follow-up Telephone Call  Date of discharge and from where:  11/06/2021 from [facility]  Attempts:  1st Attempt  Reason for unsuccessful TCM follow-up call:  Left voice message

## 2021-11-09 ENCOUNTER — Encounter: Payer: Self-pay | Admitting: Obstetrics & Gynecology

## 2021-11-11 NOTE — Telephone Encounter (Signed)
Transition Care Management Unsuccessful Follow-up Telephone Call  Date of discharge and from where:  11/06/2021 from [facility]  Attempts:  2nd Attempt  Reason for unsuccessful TCM follow-up call:  Left voice message

## 2021-11-14 NOTE — Telephone Encounter (Signed)
Transition Care Management Unsuccessful Follow-up Telephone Call  Date of discharge and from where:  11/06/2021 from [facility]  Attempts:  3rd Attempt  Reason for unsuccessful TCM follow-up call:  Unable to reach patient

## 2021-12-08 ENCOUNTER — Encounter: Payer: Self-pay | Admitting: Obstetrics & Gynecology

## 2021-12-08 ENCOUNTER — Ambulatory Visit (INDEPENDENT_AMBULATORY_CARE_PROVIDER_SITE_OTHER): Payer: BC Managed Care – PPO | Admitting: Obstetrics & Gynecology

## 2021-12-08 VITALS — BP 105/73 | HR 80 | Ht 62.0 in | Wt 225.6 lb

## 2021-12-08 DIAGNOSIS — Z9889 Other specified postprocedural states: Secondary | ICD-10-CM

## 2021-12-08 DIAGNOSIS — R102 Pelvic and perineal pain: Secondary | ICD-10-CM | POA: Diagnosis not present

## 2021-12-08 DIAGNOSIS — R3989 Other symptoms and signs involving the genitourinary system: Secondary | ICD-10-CM | POA: Diagnosis not present

## 2021-12-08 LAB — POCT URINALYSIS DIPSTICK OB
Blood, UA: NEGATIVE
Glucose, UA: NEGATIVE
Ketones, UA: NEGATIVE
Leukocytes, UA: NEGATIVE
Nitrite, UA: NEGATIVE
POC,PROTEIN,UA: NEGATIVE

## 2021-12-08 NOTE — Progress Notes (Signed)
   GYN VISIT Patient name: Samantha Sandoval MRN [id number]  Date of birth: 12/12/1995 Chief Complaint:   Vaginal Pain (For the past 2 days)  History of Present Illness:   Samantha Sandoval is a 26 y.o. G40P2012 PH female being seen today for vaginal pain   Intercourse yesterday and notes considerable pain today.  TVH in June.  They have had sex previously, but this time reports digital penetration and was concerned that they caused damaged.  Rates her pain 7/10, she has not tried any medication.  Denies vaginal bleeding or discharge.  She does not some discomfort with   Patient's last menstrual period was 08/09/2021.     08/16/2020   12:21 PM 04/03/2018    9:15 AM 03/22/2018   12:18 PM 02/20/2018    9:59 AM 06/19/2017   10:29 AM  Depression screen PHQ 2/9  Decreased Interest 0 0 0 0 0  Down, Depressed, Hopeless 0 0 0 0 0  PHQ - 2 Score 0 0 0 0 0  Altered sleeping     0  Tired, decreased energy     0  Change in appetite     0  Feeling bad or failure about yourself      0  Trouble concentrating     0  Moving slowly or fidgety/restless     0  Suicidal thoughts     0  PHQ-9 Score     0  Difficult doing work/chores     Not difficult at all     Review of Systems:   Pertinent items are noted in HPI Denies fever/chills, dizziness, headaches, visual disturbances, fatigue, shortness of breath, chest pain, abdominal pain, vomiting. Pertinent History Reviewed:  Reviewed past medical,surgical, social, obstetrical and family history.  Reviewed problem list, medications and allergies. Physical Assessment:   Vitals:   12/08/21 1428  BP: 105/73  Pulse: 80  Weight: 225 lb 9.6 oz (102.3 kg)  Height: 5\' 2"  (1.575 m)  Body mass index is 41.26 kg/m.       Physical Examination:   General appearance: alert, well appearing, and in no distress  Psych: mood appropriate, normal affect  Skin: warm & dry   Cardiovascular: normal heart rate noted  Respiratory: normal respiratory effort, no  distress  Abdomen: soft, non-tender, no rebound, no guarding  Pelvic: VULVA: normal appearing vulva with no masses, tenderness or lesions, VAGINA: normal appearing vagina with normal color and discharge, no lesions, cuff intact- no abnormalities noted visually or on bimanual exam  Extremities: no edema   Chaperone:  pt declined     Assessment & Plan:  1) Vaginal pain -reassured pt that no abnormalities were noted -vaginal cuff well healed -f/u prn  2) suspected UTI -UA negative, no further management indicated  Return if symptoms worsen or fail to improve.   Janyth Pupa, DO Attending [facility], [facility] for [employer], [place]

## 2021-12-08 NOTE — Addendum Note (Signed)
Addended by: Janece Canterbury on: 12/08/2021 03:20 PM   Modules accepted: Orders

## 2022-10-19 ENCOUNTER — Ambulatory Visit: Payer: BC Managed Care – PPO | Admitting: Obstetrics & Gynecology

## 2022-11-28 ENCOUNTER — Ambulatory Visit: Payer: BC Managed Care – PPO | Admitting: Obstetrics & Gynecology

## 2023-01-09 ENCOUNTER — Ambulatory Visit: Payer: BC Managed Care – PPO | Admitting: Obstetrics & Gynecology

## 2023-01-25 ENCOUNTER — Ambulatory Visit (INDEPENDENT_AMBULATORY_CARE_PROVIDER_SITE_OTHER): Payer: BC Managed Care – PPO | Admitting: Obstetrics & Gynecology

## 2023-01-25 ENCOUNTER — Encounter: Payer: Self-pay | Admitting: Obstetrics & Gynecology

## 2023-01-25 VITALS — BP 102/69 | HR 101 | Ht 62.0 in | Wt 210.0 lb

## 2023-01-25 DIAGNOSIS — Z01419 Encounter for gynecological examination (general) (routine) without abnormal findings: Secondary | ICD-10-CM | POA: Diagnosis not present

## 2023-01-25 MED ORDER — PREMARIN 0.625 MG/GM VA CREA: 1.0000 | TOPICAL_CREAM | Freq: Every day | VAGINAL | 12 refills | Status: AC

## 2023-01-25 NOTE — Progress Notes (Signed)
Subjective:     Samantha Sandoval is a 27 y.o. female here for a routine exam.  Patient's last menstrual period was 08/09/2021. [id number] Birth Control Method:  hysterectomy Menstrual Calendar(currently): amenorrhea  Current complaints: vaginal irritation.   Current acute medical issues:  none   Recent Gynecologic History Patient's last menstrual period was 08/09/2021. 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     VAGINAL HYSTERECTOMY N/A 08/24/2021   Procedure: HYSTERECTOMY VAGINAL;  Surgeon: Lazaro Arms, MD;  Location: [facility];  Service: Gynecology;  Laterality: N/A;   VAGINAL PROLAPSE REPAIR N/A 08/24/2021   Procedure: VAGINAL VAULT SUSPENSION;  Surgeon: Lazaro Arms, MD;  Location: [facility];  Service: Gynecology;  Laterality: N/A;    OB History     Gravida  3   Para  2   Term  2   Preterm      AB  1   Living  2      SAB  1   IAB      Ectopic      Multiple  0   Live Births  2           Social History   Socioeconomic History   Marital status: Married    Spouse name: Not on file   Number of children: 2   Years of education: Not on file   Highest education level: Not on file  Occupational History   Not on file  Tobacco Use   Smoking status: Former    Current packs/day: 0.25    Types: Cigarettes   Smokeless tobacco: Never  Vaping Use   Vaping status: Never Used  Substance and Sexual Activity   Alcohol use: No    Comment: none   Drug use: No    Comment: denies   Sexual activity: Yes    Birth  control/protection: Surgical    Comment: hysterectomy  Other Topics Concern   Not on file  Social History Narrative   Not on file   Social Determinants of Health   Financial Resource Strain: Low Risk  (01/25/2023)   Overall Financial Resource Strain (CARDIA)    Difficulty of Paying Living Expenses: Not hard at all  Food Insecurity: No Food Insecurity (01/25/2023)   Hunger Vital Sign    Worried About Running Out of Food in the Last Year: Never true    Ran Out of Food in the Last Year: Never true  Transportation Needs: No Transportation Needs (01/25/2023)   PRAPARE - Administrator, Civil Service (Medical): No    Lack of Transportation (Non-Medical): No  Physical Activity: Insufficiently Active (01/25/2023)   Exercise Vital Sign    Days of Exercise per Week: 3 days    Minutes of Exercise per Session: 10 min  Stress: Stress Concern Present (01/25/2023)   [employer] of Occupational Health - Occupational Stress Questionnaire    Feeling of Stress : Very much  Social Connections: Moderately  Integrated (01/25/2023)   Social Connection and Isolation Panel [NHANES]    Frequency of Communication with Friends and Family: Never    Frequency of Social Gatherings with Friends and Family: Never    Attends Religious Services: 1 to 4 times per year    Active Member of [employer] or Organizations: No    Attends Engineer, structural: 1 to 4 times per year    Marital Status: Married    Family History  Problem Relation Age of Onset   Diabetes Maternal Grandmother    Hypertension Maternal Grandmother    COPD Maternal Grandmother    Scoliosis Maternal Grandmother    Heart disease Maternal Grandmother    Hypertension Maternal Grandfather    Other Maternal Grandfather        2 stents in heart   Heart attack Paternal Grandfather    Congestive Heart Failure Neg Hx      Current Outpatient Medications:    busPIRone (BUSPAR) 5 MG tablet, Take 5 mg by mouth 2 (two) times  daily., Disp: , Rfl:    conjugated estrogens (PREMARIN) vaginal cream, Place 1 Applicatorful vaginally daily. Use 1 gram nightly, Disp: 30 g, Rfl: 12   LORazepam (ATIVAN) 0.5 MG tablet, Take 0.5 mg by mouth daily as needed for anxiety., Disp: , Rfl:    pantoprazole (PROTONIX) 40 MG tablet, Take 40 mg by mouth every morning., Disp: , Rfl:   Review of Systems  Review of Systems  Constitutional: Negative for fever, chills, weight loss, malaise/fatigue and diaphoresis.  HENT: Negative for hearing loss, ear pain, nosebleeds, congestion, sore throat, neck pain, tinnitus and ear discharge.   Eyes: Negative for blurred vision, double vision, photophobia, pain, discharge and redness.  Respiratory: Negative for cough, hemoptysis, sputum production, shortness of breath, wheezing and stridor.   Cardiovascular: Negative for chest pain, palpitations, orthopnea, claudication, leg swelling and PND.  Gastrointestinal: negative for abdominal pain. Negative for heartburn, nausea, vomiting, diarrhea, constipation, blood in stool and melena.  Genitourinary: Negative for dysuria, urgency, frequency, hematuria and flank pain.  Musculoskeletal: Negative for myalgias, back pain, joint pain and falls.  Skin: Negative for itching and rash.  Neurological: Negative for dizziness, tingling, tremors, sensory change, speech change, focal weakness, seizures, loss of consciousness, weakness and headaches.  Endo/Heme/Allergies: Negative for environmental allergies and polydipsia. Does not bruise/bleed easily.  Psychiatric/Behavioral: Negative for depression, suicidal ideas, hallucinations, memory loss and substance abuse. The patient is not nervous/anxious and does not have insomnia.        Objective:  Blood pressure 102/69, pulse (!) 101, height 5\' 2"  (1.575 m), weight 210 lb (95.3 kg), last menstrual period 08/09/2021.   Physical Exam  Vitals reviewed. Constitutional: She is oriented to person, place, and time. She  appears well-developed and well-nourished.  HENT:  Head: Normocephalic and atraumatic.        Right Ear: External ear normal.  Left Ear: External ear normal.  Nose: Nose normal.  Mouth/Throat: Oropharynx is clear and moist.  Eyes: Conjunctivae and EOM are normal. Pupils are equal, round, and reactive to light. Right eye exhibits no discharge. Left eye exhibits no discharge. No scleral icterus.  Neck: Normal range of motion. Neck supple. No tracheal deviation present. No thyromegaly present.  Cardiovascular: Normal rate, regular rhythm, normal heart sounds and intact distal pulses.  Exam reveals no gallop and no friction rub.   No murmur heard. Respiratory: Effort normal and breath sounds normal. No respiratory distress. She has no wheezes. She has no rales. She  exhibits no tenderness.  GI: Soft. Bowel sounds are normal. She exhibits no distension and no mass. There is no tenderness. There is no rebound and no guarding.  Genitourinary:  Breasts no masses skin changes or nipple changes bilaterally  Vulva is normal without lesions Vagina is pink moist without discharge Cervix absent Uterus is absent Adnexa is negative with normal sized ovaries   Musculoskeletal: Normal range of motion. She exhibits no edema and no tenderness.  Neurological: She is alert and oriented to person, place, and time. She has normal reflexes. She displays normal reflexes. No cranial nerve deficit. She exhibits normal muscle tone. Coordination normal.  Skin: Skin is warm and dry. No rash noted. No erythema. No pallor.  Psychiatric: She has a normal mood and affect. Her behavior is normal. Judgment and thought content normal.       Medications Ordered at today's visit: Meds ordered this encounter  Medications   conjugated estrogens (PREMARIN) vaginal cream    Sig: Place 1 Applicatorful vaginally daily. Use 1 gram nightly    Dispense:  30 g    Refill:  12    Other orders placed at today's visit: No orders of  the defined types were placed in this encounter.    ASSESSMENT + PLAN:    ICD-10-CM   1. Well woman exam with routine gynecological exam  Z01.419           Return in about 3 years (around 01/24/2026) for yearly.
